# Patient Record
Sex: Female | Born: 1955 | Race: Black or African American | Hispanic: No | Smoking: Former smoker
Health system: Southern US, Community
[De-identification: ages and names within clinical notes are randomized; demographics above are authoritative.]

## PROBLEM LIST (undated history)

## (undated) DIAGNOSIS — B1920 Unspecified viral hepatitis C without hepatic coma: Secondary | ICD-10-CM

## (undated) DIAGNOSIS — C220 Liver cell carcinoma: Secondary | ICD-10-CM

## (undated) DIAGNOSIS — I10 Essential (primary) hypertension: Secondary | ICD-10-CM

## (undated) HISTORY — PX: HEPATIC ARTERY EMBOLIZATION: SHX1743

## (undated) SURGERY — EGD (ESOPHAGOGASTRODUODENOSCOPY)
Anesthesia: Moderate Sedation

---

## 1999-08-06 ENCOUNTER — Encounter: Payer: Self-pay | Admitting: Emergency Medicine

## 1999-08-06 ENCOUNTER — Emergency Department (HOSPITAL_COMMUNITY): Admission: EM | Admit: 1999-08-06 | Discharge: 1999-08-06 | Payer: Self-pay | Admitting: Emergency Medicine

## 2000-07-01 ENCOUNTER — Emergency Department (HOSPITAL_COMMUNITY): Admission: EM | Admit: 2000-07-01 | Discharge: 2000-07-01 | Payer: Self-pay | Admitting: Emergency Medicine

## 2003-09-25 ENCOUNTER — Emergency Department (HOSPITAL_COMMUNITY): Admission: EM | Admit: 2003-09-25 | Discharge: 2003-09-25 | Payer: Self-pay | Admitting: Emergency Medicine

## 2003-10-01 ENCOUNTER — Ambulatory Visit: Payer: Self-pay | Admitting: Family Medicine

## 2003-10-05 ENCOUNTER — Ambulatory Visit: Payer: Self-pay | Admitting: Family Medicine

## 2003-10-28 ENCOUNTER — Ambulatory Visit: Payer: Self-pay | Admitting: Family Medicine

## 2003-11-03 ENCOUNTER — Ambulatory Visit: Payer: Self-pay | Admitting: Family Medicine

## 2003-11-23 ENCOUNTER — Ambulatory Visit: Payer: Self-pay | Admitting: Family Medicine

## 2003-12-09 ENCOUNTER — Ambulatory Visit: Payer: Self-pay | Admitting: Family Medicine

## 2004-01-01 ENCOUNTER — Ambulatory Visit: Payer: Self-pay | Admitting: Family Medicine

## 2004-06-08 ENCOUNTER — Emergency Department (HOSPITAL_COMMUNITY): Admission: EM | Admit: 2004-06-08 | Discharge: 2004-06-08 | Payer: Self-pay | Admitting: Emergency Medicine

## 2004-09-15 ENCOUNTER — Emergency Department (HOSPITAL_COMMUNITY): Admission: EM | Admit: 2004-09-15 | Discharge: 2004-09-15 | Payer: Self-pay | Admitting: Emergency Medicine

## 2005-02-24 ENCOUNTER — Emergency Department (HOSPITAL_COMMUNITY): Admission: EM | Admit: 2005-02-24 | Discharge: 2005-02-24 | Payer: Self-pay | Admitting: Emergency Medicine

## 2006-07-02 ENCOUNTER — Emergency Department (HOSPITAL_COMMUNITY): Admission: EM | Admit: 2006-07-02 | Discharge: 2006-07-02 | Payer: Self-pay | Admitting: Emergency Medicine

## 2008-09-29 ENCOUNTER — Emergency Department (HOSPITAL_COMMUNITY): Admission: EM | Admit: 2008-09-29 | Discharge: 2008-09-29 | Payer: Self-pay | Admitting: Emergency Medicine

## 2008-10-06 ENCOUNTER — Emergency Department (HOSPITAL_COMMUNITY): Admission: EM | Admit: 2008-10-06 | Discharge: 2008-10-06 | Payer: Self-pay | Admitting: Emergency Medicine

## 2008-11-07 ENCOUNTER — Emergency Department (HOSPITAL_COMMUNITY): Admission: EM | Admit: 2008-11-07 | Discharge: 2008-11-08 | Payer: Self-pay | Admitting: Emergency Medicine

## 2009-05-13 ENCOUNTER — Emergency Department (HOSPITAL_COMMUNITY): Admission: EM | Admit: 2009-05-13 | Discharge: 2009-05-13 | Payer: Self-pay | Admitting: Emergency Medicine

## 2010-02-24 ENCOUNTER — Emergency Department (HOSPITAL_COMMUNITY)
Admission: EM | Admit: 2010-02-24 | Discharge: 2010-02-24 | Disposition: A | Discharge status: Home / Self Care | Payer: Self-pay | Attending: Emergency Medicine | Admitting: Emergency Medicine

## 2010-02-24 DIAGNOSIS — R059 Cough, unspecified: Secondary | ICD-10-CM | POA: Insufficient documentation

## 2010-02-24 DIAGNOSIS — R05 Cough: Secondary | ICD-10-CM | POA: Insufficient documentation

## 2010-02-24 DIAGNOSIS — I10 Essential (primary) hypertension: Secondary | ICD-10-CM | POA: Insufficient documentation

## 2010-02-24 DIAGNOSIS — J029 Acute pharyngitis, unspecified: Secondary | ICD-10-CM | POA: Insufficient documentation

## 2010-02-24 DIAGNOSIS — E119 Type 2 diabetes mellitus without complications: Secondary | ICD-10-CM | POA: Insufficient documentation

## 2010-02-24 DIAGNOSIS — Z79899 Other long term (current) drug therapy: Secondary | ICD-10-CM | POA: Insufficient documentation

## 2010-02-24 LAB — RAPID STREP SCREEN (MED CTR MEBANE ONLY): Streptococcus, Group A Screen (Direct): NEGATIVE

## 2010-04-28 LAB — CBC
HCT: 44.7 % (ref 36.0–46.0)
MCHC: 34.7 g/dL (ref 30.0–36.0)
MCV: 92.8 fL (ref 78.0–100.0)
Platelets: 133 10*3/uL — ABNORMAL LOW (ref 150–400)
WBC: 5.1 10*3/uL (ref 4.0–10.5)

## 2010-04-28 LAB — URINALYSIS, ROUTINE W REFLEX MICROSCOPIC
Ketones, ur: NEGATIVE mg/dL
Leukocytes, UA: NEGATIVE
Nitrite: NEGATIVE
Protein, ur: NEGATIVE mg/dL
Urobilinogen, UA: 0.2 mg/dL (ref 0.0–1.0)

## 2010-04-28 LAB — POCT I-STAT, CHEM 8
Creatinine, Ser: 0.8 mg/dL (ref 0.4–1.2)
Glucose, Bld: 497 mg/dL — ABNORMAL HIGH (ref 70–99)
Hemoglobin: 15.6 g/dL — ABNORMAL HIGH (ref 12.0–15.0)
Sodium: 136 mEq/L (ref 135–145)
TCO2: 29 mmol/L (ref 0–100)

## 2010-04-28 LAB — POCT I-STAT 3, VENOUS BLOOD GAS (G3P V)
Acid-Base Excess: 7 mmol/L — ABNORMAL HIGH (ref 0.0–2.0)
O2 Saturation: 23 %
pCO2, Ven: 53.4 mmHg — ABNORMAL HIGH (ref 45.0–50.0)

## 2010-04-28 LAB — BASIC METABOLIC PANEL
BUN: 18 mg/dL (ref 6–23)
CO2: 28 mEq/L (ref 19–32)
Chloride: 93 mEq/L — ABNORMAL LOW (ref 96–112)
Glucose, Bld: 584 mg/dL (ref 70–99)
Potassium: 3.7 mEq/L (ref 3.5–5.1)

## 2010-04-28 LAB — GLUCOSE, CAPILLARY
Glucose-Capillary: 291 mg/dL — ABNORMAL HIGH (ref 70–99)
Glucose-Capillary: 367 mg/dL — ABNORMAL HIGH (ref 70–99)
Glucose-Capillary: 455 mg/dL — ABNORMAL HIGH (ref 70–99)
Glucose-Capillary: 560 mg/dL (ref 70–99)

## 2010-04-28 LAB — DIFFERENTIAL
Basophils Relative: 0 % (ref 0–1)
Eosinophils Absolute: 0 10*3/uL (ref 0.0–0.7)
Eosinophils Relative: 1 % (ref 0–5)
Lymphs Abs: 2.4 10*3/uL (ref 0.7–4.0)

## 2010-04-28 LAB — URINE MICROSCOPIC-ADD ON

## 2010-04-28 LAB — KETONES, QUALITATIVE: Acetone, Bld: NEGATIVE

## 2010-04-29 LAB — POCT I-STAT, CHEM 8
Chloride: 104 mEq/L (ref 96–112)
HCT: 47 % — ABNORMAL HIGH (ref 36.0–46.0)
Potassium: 3.5 mEq/L (ref 3.5–5.1)

## 2010-12-28 ENCOUNTER — Emergency Department (HOSPITAL_COMMUNITY)
Admission: EM | Admit: 2010-12-28 | Discharge: 2010-12-28 | Disposition: A | Discharge status: Home / Self Care | Payer: Self-pay | Attending: Emergency Medicine | Admitting: Emergency Medicine

## 2010-12-28 DIAGNOSIS — I10 Essential (primary) hypertension: Secondary | ICD-10-CM | POA: Insufficient documentation

## 2010-12-28 DIAGNOSIS — L2989 Other pruritus: Secondary | ICD-10-CM | POA: Insufficient documentation

## 2010-12-28 DIAGNOSIS — E119 Type 2 diabetes mellitus without complications: Secondary | ICD-10-CM | POA: Insufficient documentation

## 2010-12-28 DIAGNOSIS — Z79899 Other long term (current) drug therapy: Secondary | ICD-10-CM | POA: Insufficient documentation

## 2010-12-28 DIAGNOSIS — R21 Rash and other nonspecific skin eruption: Secondary | ICD-10-CM | POA: Insufficient documentation

## 2010-12-28 DIAGNOSIS — L298 Other pruritus: Secondary | ICD-10-CM | POA: Insufficient documentation

## 2010-12-28 HISTORY — DX: Essential (primary) hypertension: I10

## 2010-12-28 LAB — GLUCOSE, CAPILLARY: Glucose-Capillary: 152 mg/dL — ABNORMAL HIGH (ref 70–99)

## 2010-12-28 MED ORDER — HYDROXYZINE HCL 25 MG PO TABS: 25.0000 mg | ORAL_TABLET | Freq: Once | ORAL | Status: DC

## 2010-12-28 MED ORDER — TRIAMCINOLONE ACETONIDE 0.1 % EX CREA: TOPICAL_CREAM | Freq: Two times a day (BID) | CUTANEOUS | Status: DC

## 2010-12-28 NOTE — ED Notes (Signed)
PT reports itching to BIL. Palms and bottom of feet. PT also reports to be a DM on oral agents.

## 2010-12-28 NOTE — ED Notes (Signed)
Pt presents with 3 week h/o itching mainly to palms and bottoms of feet.  Pt reports rash to same areas.  Pt denies any new soaps, lotions.

## 2010-12-28 NOTE — ED Provider Notes (Signed)
History     CSN: [id number] Arrival date & time: 12/28/2010  1:39 PM   First MD Initiated Contact with Patient 12/28/10 1637      Chief Complaint  Patient presents with  . Pruritis    (Consider location/radiation/quality/duration/timing/severity/associated sxs/prior treatment) HPI Comments: Patient states she's been having itching of her palm and bottoms of her feet for the last 3 weeks.  She denies any new changes in her soaps or lotions.  Over time plaque started forming on her hands and feet.  She denies a known history of eczema or psoriasis.  She did not have a primary care doctor to followup with. Pt is a diabetic, denies a history of STDs, & IV drug use.   The history is provided by the patient.    Past Medical History  Diagnosis Date  . Diabetes mellitus   . Hypertension     History reviewed. No pertinent past surgical history.  No family history on file.  History  Substance Use Topics  . Smoking status: Current Everyday Smoker -- 0.5 packs/day  . Smokeless tobacco: Not on file  . Alcohol Use: Yes    OB History    Grav Para Term Preterm Abortions TAB SAB Ect Mult Living                  Review of Systems  Constitutional: Negative for fever and chills.  HENT: Negative for neck stiffness.   Gastrointestinal: Negative for vomiting and abdominal pain.  Genitourinary: Negative for dysuria, urgency, vaginal discharge, genital sores, vaginal pain, pelvic pain and dyspareunia.  Musculoskeletal: Negative for joint swelling.  Skin: Positive for rash.  All other systems reviewed and are negative.    Allergies  Review of patient's allergies indicates no known allergies.  Home Medications   Current Outpatient Rx  Name Route Sig Dispense Refill  . CLONIDINE HCL 0.2 MG PO TABS Oral Take 0.2 mg by mouth 2 (two) times daily.      Marland Kitchen GLIPIZIDE 5 MG PO TABS Oral Take 5 mg by mouth 2 (two) times daily before a meal.      . LISINOPRIL-HYDROCHLOROTHIAZIDE 20-25 MG PO  TABS Oral Take 1 tablet by mouth daily.      Marland Kitchen METFORMIN HCL 1000 MG PO TABS Oral Take 1,000 mg by mouth 2 (two) times daily with a meal.        BP 199/119  Pulse 73  Temp(Src) 98.7 F (37.1 C) (Oral)  Resp 14  SpO2 98%  Physical Exam  Nursing note and vitals reviewed. Constitutional: She is oriented to person, place, and time. She appears well-developed and well-nourished. No distress.  HENT:  Head: Normocephalic and atraumatic.  Eyes:       Normal appearance  Neck: Normal range of motion.  Neurological: She is alert and oriented to person, place, and time.  Skin: Skin is warm and dry. Rash noted. No erythema. No pallor.       Pt has gray scale like plaques that are pruritic in nature over her heals and wrist.  Psychiatric: She has a normal mood and affect. Her behavior is normal.    ED Course  Procedures (including critical care time)  Discussed with patient the need to followup with a family care doctor about her hypertension.  Itas also discussed for her followup with a dermatologist in regards to her rash on her palms and heels.  A resource guide will be provided upon discharge.  Labs Reviewed  POCT CBG MONITORING  GLUCOSE, CAPILLARY   No results found.   No diagnosis found.    MDM  Hypertension Rash          Jaci Carrel, [place] 12/28/10 1714

## 2010-12-28 NOTE — ED Provider Notes (Signed)
Medical screening examination/treatment/procedure(s) were performed by non-physician practitioner and as supervising physician I was immediately available for consultation/collaboration.   Joya Gaskins, MD 12/28/10 [phone]

## 2010-12-28 NOTE — ED Notes (Signed)
CBG 152  

## 2011-04-03 ENCOUNTER — Other Ambulatory Visit: Payer: Self-pay

## 2011-04-03 ENCOUNTER — Encounter: Payer: Self-pay | Admitting: Family Medicine

## 2011-04-03 ENCOUNTER — Ambulatory Visit (HOSPITAL_COMMUNITY)
Admission: RE | Admit: 2011-04-03 | Discharge: 2011-04-03 | Disposition: A | Discharge status: Home / Self Care | Payer: Self-pay | Source: Ambulatory Visit | Attending: Family Medicine | Admitting: Family Medicine

## 2011-04-03 ENCOUNTER — Ambulatory Visit (INDEPENDENT_AMBULATORY_CARE_PROVIDER_SITE_OTHER): Payer: Self-pay | Admitting: Family Medicine

## 2011-04-03 DIAGNOSIS — I10 Essential (primary) hypertension: Secondary | ICD-10-CM

## 2011-04-03 DIAGNOSIS — I517 Cardiomegaly: Secondary | ICD-10-CM | POA: Insufficient documentation

## 2011-04-03 DIAGNOSIS — E119 Type 2 diabetes mellitus without complications: Secondary | ICD-10-CM | POA: Insufficient documentation

## 2011-04-03 DIAGNOSIS — I160 Hypertensive urgency: Secondary | ICD-10-CM | POA: Insufficient documentation

## 2011-04-03 LAB — POCT URINALYSIS DIP (MANUAL ENTRY)
Bilirubin, UA: NEGATIVE
Glucose, UA: NEGATIVE
Ketones, POC UA: NEGATIVE
Leukocytes, UA: NEGATIVE
Spec Grav, UA: 1.02
Urobilinogen, UA: 0.2

## 2011-04-03 LAB — CBC WITH DIFFERENTIAL/PLATELET
Basophils Absolute: 0 10*3/uL (ref 0.0–0.1)
Basophils Relative: 0 % (ref 0–1)
MCHC: 32.9 g/dL (ref 30.0–36.0)
Monocytes Absolute: 0.5 10*3/uL (ref 0.1–1.0)
Neutro Abs: 2.3 10*3/uL (ref 1.7–7.7)
Neutrophils Relative %: 39 % — ABNORMAL LOW (ref 43–77)
RDW: 13.9 % (ref 11.5–15.5)

## 2011-04-03 LAB — POCT GLYCOSYLATED HEMOGLOBIN (HGB A1C): Hemoglobin A1C: 7.1

## 2011-04-03 LAB — COMPREHENSIVE METABOLIC PANEL
AST: 48 U/L — ABNORMAL HIGH (ref 0–37)
Albumin: 4 g/dL (ref 3.5–5.2)
Alkaline Phosphatase: 159 U/L — ABNORMAL HIGH (ref 39–117)
BUN: 14 mg/dL (ref 6–23)
Potassium: 3.7 mEq/L (ref 3.5–5.3)

## 2011-04-03 MED ORDER — LISINOPRIL-HYDROCHLOROTHIAZIDE 20-25 MG PO TABS: 1.0000 | ORAL_TABLET | Freq: Every day | ORAL | Status: DC

## 2011-04-03 MED ORDER — CLONIDINE HCL 0.2 MG PO TABS
0.2000 mg | ORAL_TABLET | Freq: Once | ORAL | Status: AC
  Administered 2011-04-03: 0.2 mg via ORAL

## 2011-04-03 NOTE — Assessment & Plan Note (Signed)
No clinical signs of end organ damage on clinical exam or EKG. Labs obtained including CBC, CMET, UA and UDS. Pt given clonidine 0.2 in clinic. Pt instructed to resume home lis-HCTZ with follow up tomorrow. Stressed importance of medication compliance given baseline pressures and clonidine use.Cardovascular red flags discussed extensively with pt. Follow up in am.

## 2011-04-03 NOTE — Assessment & Plan Note (Signed)
A1C checked today. Will follow up at subsequent visit.

## 2011-04-03 NOTE — Progress Notes (Signed)
Addended by: [place], Taft Worthing on: 04/03/2011 04:17 PM   Modules accepted: Orders

## 2011-04-03 NOTE — Patient Instructions (Signed)
It was good to meet she today Be sure to get your blood pressure medications refilled Come back to see me tomorrow If he develops any severe chest pain, weakness, dizziness, headache, vision changes go to the emergency room Call if any questions, God Bless, Doree Albee MD

## 2011-04-03 NOTE — Progress Notes (Signed)
S:  Pt presents today as a new pt with hypertensive urgency. Pt states that she has a longstanding history of hypertension that has been very poorly followed. Pt states that for several years, she has gone to multiple peripheral clinics and emergency departments to get medication refills. Pt states that she has been out of her medications for the last 6 months. Medications include lisinopril-HCTZ 20-25 and clonidine 0.2 mg BID. Pt states that she has been taking ibuprofen for headache and one episode of L forearm tingling x1-2 mins, but otherwise asymptomatic. No CP, hemiparesis, confusion, slurred speech or weakness. 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 No history of heart attack or stroke.   Pt will be applying for [facility] once she leaves the office today.       O:  Current Outpatient Prescriptions  Medication Sig Dispense Refill  . cloNIDine (CATAPRES) 0.2 MG tablet Take 0.2 mg by mouth 2 (two) times daily.        Marland Kitchen glipiZIDE (GLUCOTROL) 5 MG tablet Take 5 mg by mouth 2 (two) times daily before a meal.        . lisinopril-hydrochlorothiazide (PRINZIDE,ZESTORETIC) 20-25 MG per tablet Take 1 tablet by mouth daily.  60 tablet  3  . metFORMIN (GLUCOPHAGE) 1000 MG tablet Take 1,000 mg by mouth 2 (two) times daily with a meal.        . triamcinolone cream (KENALOG) 0.1 % Apply topically 2 (two) times daily.  30 g  0   Current Facility-Administered Medications  Medication Dose Route Frequency Provider Last Rate Last Dose  . cloNIDine (CATAPRES) tablet 0.2 mg  0.2 mg Oral Once Floydene Flock, MD   0.2 mg at 04/03/11 1122    Wt Readings from Last 3 Encounters:  04/03/11 150 lb 14.4 oz (68.448 kg)   Temp Readings from Last 3 Encounters:  12/28/10 98.7 F (37.1 C) Oral   BP Readings from Last 3 Encounters:  04/03/11 232/120  12/28/10 199/119   Pulse Readings from Last 3 Encounters:  04/03/11 78  12/28/10 73    Gen: up in  chair, NAD HEENT: NCAT, EOMI, TMs clear bilaterally CV: RRR, no murmurs auscultated PULM: CTAB, no wheezes, rales, rhoncii ABD: S/NT/+ bowel sounds  EXT: 2+ peripheral pulses  EKG: NSR, No ST or T wave abnormalities, marked LVH A/P:

## 2011-04-04 ENCOUNTER — Ambulatory Visit: Payer: Self-pay | Admitting: Family Medicine

## 2011-04-04 LAB — DRUG SCREEN, URINE
Barbiturate Quant, Ur: NEGATIVE
Benzodiazepines.: NEGATIVE
Creatinine,U: 93.96 mg/dL
Methadone: NEGATIVE
Propoxyphene: NEGATIVE

## 2011-04-05 ENCOUNTER — Telehealth: Payer: Self-pay | Admitting: Family Medicine

## 2011-04-05 ENCOUNTER — Encounter: Payer: Self-pay | Admitting: Family Medicine

## 2011-04-05 ENCOUNTER — Ambulatory Visit (INDEPENDENT_AMBULATORY_CARE_PROVIDER_SITE_OTHER): Payer: Self-pay | Admitting: Family Medicine

## 2011-04-05 VITALS — BP 210/120 | HR 64 | Temp 98.2°F | Ht 65.0 in | Wt 148.0 lb

## 2011-04-05 DIAGNOSIS — I160 Hypertensive urgency: Secondary | ICD-10-CM

## 2011-04-05 DIAGNOSIS — I10 Essential (primary) hypertension: Secondary | ICD-10-CM

## 2011-04-05 MED ORDER — LISINOPRIL-HYDROCHLOROTHIAZIDE 20-25 MG PO TABS: 2.0000 | ORAL_TABLET | Freq: Every day | ORAL | Status: DC

## 2011-04-05 MED ORDER — AMLODIPINE BESYLATE 10 MG PO TABS: 10.0000 mg | ORAL_TABLET | Freq: Every day | ORAL | Status: DC

## 2011-04-05 MED ORDER — CLONIDINE HCL 0.2 MG PO TABS: 0.2000 mg | ORAL_TABLET | Freq: Two times a day (BID) | ORAL | Status: DC

## 2011-04-05 NOTE — Assessment & Plan Note (Addendum)
Pressures still elevated. Asymptomatic currently. I suspect this has been a longstanding issue. Will double lisinopril-HCTZ dose as well as start norvasc. Discussed cardiovascular red flags at length. Blood pressure recheck in 2 days. Follow up early next week to recheck. Pt has still not seen [facility] Would like to obtain ECHO as well as stress test once this is set up. Will hold on starting aspirin given significantly elevated BPs.

## 2011-04-05 NOTE — Patient Instructions (Signed)
It was good to see you today  I am doubling your lisinopril-HCTZ dose. Take 2 tabs daily  I am starting you on norvasc.  Come in on 04/07/11 for a nurse BP check Come back to see me next week If you develop any severe chest pain, dizziness, or shortness of breath, go to the emergency room Call if any questions, God Bless,  Doree Albee MD

## 2011-04-05 NOTE — Progress Notes (Signed)
S:   Pt presents today for BP follow up. Pt was seen on 3/11 and was noted to be in hypertensive urgency at the time with pressures 230s/120s. Per pt, she  was not on medications for > 6 months. Medications include clonidine 0.2mg  BID as well as lisinopril-HCTZ 20-12.5. Pt was given clonidine 0.2 mg in clinic with pt being instructed to resume prior medications and to follow up within 24-48 hours.   Today, pt states that she has been compliant with medications since last visit. Pt denies any SOB, HA, CP. Pt reports one episode of chest discomfort associated with drinking soda. Chest discomfort was improved with belching. Pt states that she checked her BP on Monday. Per her recollection, she had BP of 180.   Pt was also pending [facility] set up. Pt has still not seen [facility]. Pt states that she plans to see Jaynee Eagles today.       O:  Current Outpatient Prescriptions  Medication Sig Dispense Refill  . amLODipine (NORVASC) 10 MG tablet Take 1 tablet (10 mg total) by mouth daily.  90 tablet  3  . cloNIDine (CATAPRES) 0.2 MG tablet Take 1 tablet (0.2 mg total) by mouth 2 (two) times daily.  60 tablet  1  . glipiZIDE (GLUCOTROL) 5 MG tablet Take 5 mg by mouth 2 (two) times daily before a meal.        . lisinopril-hydrochlorothiazide (PRINZIDE,ZESTORETIC) 20-25 MG per tablet Take 2 tablets by mouth daily.  60 tablet  3  . metFORMIN (GLUCOPHAGE) 1000 MG tablet Take 1,000 mg by mouth 2 (two) times daily with a meal.        . triamcinolone cream (KENALOG) 0.1 % Apply topically 2 (two) times daily.  30 g  0    Wt Readings from Last 3 Encounters:  04/05/11 148 lb (67.132 kg)  04/03/11 150 lb 14.4 oz (68.448 kg)   Temp Readings from Last 3 Encounters:  04/05/11 98.2 F (36.8 C) Oral  12/28/10 98.7 F (37.1 C) Oral   BP Readings from Last 3 Encounters:  04/05/11 210/120  04/03/11 232/120  12/28/10 199/119   Pulse Readings from Last 3 Encounters:  04/05/11 64  04/03/11 78  12/28/10 73      Gen: up in chair, NAD  HEENT: NCAT, EOMI, TMs clear bilaterally  CV: RRR, no murmurs auscultated  PULM: CTAB, no wheezes, rales, rhoncii  ABD: S/NT/+ bowel sounds  EXT: 2+ peripheral pulses  EKG: NSR, No ST or T wave abnormalities, marked LVH Neuro: no focal neurological deficits.   A/P:

## 2011-04-05 NOTE — Telephone Encounter (Signed)
States that the Norvasc is too expensive [facility] [place]

## 2011-04-07 ENCOUNTER — Ambulatory Visit (INDEPENDENT_AMBULATORY_CARE_PROVIDER_SITE_OTHER): Payer: Self-pay | Admitting: *Deleted

## 2011-04-07 VITALS — BP 148/92 | HR 76

## 2011-04-07 DIAGNOSIS — I10 Essential (primary) hypertension: Secondary | ICD-10-CM

## 2011-04-07 NOTE — Progress Notes (Signed)
In for BP check today. BP checked manually after patient rested for 10 minutes. BP LA 148/96 and RA 148/92  Pulse 76.    She brings in readings at home also yesterday 6:42 PM 125/91 then 6:45 PM 120/80 This AM reading was 166/103 at home.    Patient denies any chest pain , SOB, dizziness. She is taking meds as directed.  Reviewed meds with her.  Has appointment on 03/21 with Dr. Alvester Morin  Will forward readings to MD.

## 2011-04-10 NOTE — Progress Notes (Signed)
Patient notified of message from MD. 

## 2011-04-10 NOTE — Progress Notes (Signed)
Both readings in office look okay. Please asked patient to bring her cuff with her to the next appointment for comparison.

## 2011-04-10 NOTE — Telephone Encounter (Signed)
This message just appeared on my desk top. Message was received 03/13  . Called patient. She states  she was able to borrow money to buy this medication. She has appointment this week with PCP will discuss also at that time.

## 2011-04-13 ENCOUNTER — Ambulatory Visit: Payer: Self-pay | Admitting: Family Medicine

## 2011-04-18 ENCOUNTER — Ambulatory Visit: Payer: Self-pay | Admitting: Family Medicine

## 2011-04-19 ENCOUNTER — Ambulatory Visit (INDEPENDENT_AMBULATORY_CARE_PROVIDER_SITE_OTHER): Payer: Self-pay | Admitting: Family Medicine

## 2011-04-19 ENCOUNTER — Encounter: Payer: Self-pay | Admitting: Family Medicine

## 2011-04-19 VITALS — BP 169/92 | HR 58 | Ht 65.0 in | Wt 149.7 lb

## 2011-04-19 DIAGNOSIS — I10 Essential (primary) hypertension: Secondary | ICD-10-CM

## 2011-04-19 LAB — COMPREHENSIVE METABOLIC PANEL
ALT: 56 U/L — ABNORMAL HIGH (ref 0–35)
AST: 48 U/L — ABNORMAL HIGH (ref 0–37)
CO2: 28 mEq/L (ref 19–32)
Calcium: 9.4 mg/dL (ref 8.4–10.5)
Chloride: 103 mEq/L (ref 96–112)
Potassium: 3.4 mEq/L — ABNORMAL LOW (ref 3.5–5.3)
Sodium: 142 mEq/L (ref 135–145)
Total Protein: 7.7 g/dL (ref 6.0–8.3)

## 2011-04-19 LAB — LDL CHOLESTEROL, DIRECT: Direct LDL: 73 mg/dL

## 2011-04-19 LAB — POCT GLYCOSYLATED HEMOGLOBIN (HGB A1C): Hemoglobin A1C: 6.9

## 2011-04-19 NOTE — Progress Notes (Signed)
S:  Pt is here for HTN follow up. Pt was noted to be in hypertensive urgency on initial visit. Pt was placed on multiple medication regimen for this. Pt is also pending formal [facility] set up. Pt has follow up appt with Jaynee Eagles today.   HTN:  Checking BPS Daily ?:no; intermittently checking at pharmacy  BP Range:140s-160s per pt  Any HA, CP, SOB?:no Any Medication Side Effects:norvasc--> causing dry mouth per pt  Medication Compliance:yes         O:  Current Outpatient Prescriptions  Medication Sig Dispense Refill  . amLODipine (NORVASC) 10 MG tablet Take 1 tablet (10 mg total) by mouth daily.  90 tablet  3  . cloNIDine (CATAPRES) 0.2 MG tablet Take 1 tablet (0.2 mg total) by mouth 2 (two) times daily.  60 tablet  1  . glipiZIDE (GLUCOTROL) 5 MG tablet Take 5 mg by mouth 2 (two) times daily before a meal.        . lisinopril-hydrochlorothiazide (PRINZIDE,ZESTORETIC) 20-25 MG per tablet Take 2 tablets by mouth daily.  60 tablet  3  . metFORMIN (GLUCOPHAGE) 1000 MG tablet Take 1,000 mg by mouth 2 (two) times daily with a meal.        . triamcinolone cream (KENALOG) 0.1 % Apply topically 2 (two) times daily.  30 g  0    Wt Readings from Last 3 Encounters:  04/19/11 149 lb 11.2 oz (67.903 kg)  04/05/11 148 lb (67.132 kg)  04/03/11 150 lb 14.4 oz (68.448 kg)   Temp Readings from Last 3 Encounters:  04/05/11 98.2 F (36.8 C) Oral  12/28/10 98.7 F (37.1 C) Oral   BP Readings from Last 3 Encounters:  04/19/11 169/92  04/07/11 148/92  04/05/11 210/120   Pulse Readings from Last 3 Encounters:  04/19/11 58  04/07/11 76  04/05/11 64   General: alert and cooperative HEENT: PERRLA and extra ocular movement intact Heart: S1, S2 normal, no murmur, rub or gallop, regular rate and rhythm Lungs: clear to auscultation, no wheezes or rales and unlabored breathing Abdomen: abdomen is soft without significant tenderness, masses, organomegaly or guarding Extremities: extremities  normal, atraumatic, no cyanosis or edema Skin:no rashes Neurology: normal without focal findings, mental status, speech normal, alert and oriented x3, PERLA and reflexes normal and symmetric   A/P:

## 2011-04-19 NOTE — Assessment & Plan Note (Addendum)
Hypertensive urgency otherwise clinically resolved. We'll continue with current regimen. Patient has not taken her Norvasc today with noted elevated blood pressure. Will check followup labs today as lisinopril/HCTZ was recently increased. Will also check A1c direct LDL or secondary risk management. Will further expand workup to include echocardiogram stress test one-step rales or wheeze. This was discussed with patient. Cardiovascular red flags were also discussed.

## 2011-04-19 NOTE — Patient Instructions (Signed)

## 2011-04-24 ENCOUNTER — Telehealth: Payer: Self-pay | Admitting: Family Medicine

## 2011-04-24 MED ORDER — POTASSIUM CHLORIDE CRYS ER 20 MEQ PO TBCR: 20.0000 meq | EXTENDED_RELEASE_TABLET | Freq: Every day | ORAL | Status: DC

## 2011-04-24 NOTE — Telephone Encounter (Signed)
Called and followed up with pt about lab results. Told pt about low K and that I would call medication in for this. Pt agreeable. Told pt to follow up in next 1-2 weeks as previously discussed. Pt expressed understanding.

## 2011-05-04 ENCOUNTER — Telehealth: Payer: Self-pay | Admitting: Family Medicine

## 2011-05-04 NOTE — Telephone Encounter (Signed)
Mrs. Danielsen calling to say the new potassium medicine given has been causing nausea with vomitting.  She is concerned about continuing this, but have been taking it dilligently.  Want to have you call her back asap to advise if she should cease taking or continue.  Did not have much of the reaction today, but still concerned.  Have an appt on Monday with you at 9:30.

## 2011-05-08 ENCOUNTER — Ambulatory Visit: Payer: Self-pay | Admitting: Family Medicine

## 2011-05-09 NOTE — Telephone Encounter (Signed)
Called to follow patient about nausea associated with taking potassium. Patient states that she is having nausea since starting this medication however she did note that her son had a stomach bug around the same time. Patient states she stopped taking the potassium the past 3-4 days and nausea has resolved. Discussed with patient at medication was started because she had a low potassium level. Discussed with patient that it would be prudent for her to come in for [place] to recheck labs as well as recheck her blood pressure. Patient states she was agreeable to come in tomorrow on April 17 for a clinical visit.

## 2011-05-10 ENCOUNTER — Encounter: Payer: Self-pay | Admitting: Family Medicine

## 2011-05-10 ENCOUNTER — Ambulatory Visit (INDEPENDENT_AMBULATORY_CARE_PROVIDER_SITE_OTHER): Payer: Self-pay | Admitting: Family Medicine

## 2011-05-10 VITALS — BP 166/109 | HR 80 | Ht 65.0 in | Wt 151.0 lb

## 2011-05-10 DIAGNOSIS — I10 Essential (primary) hypertension: Secondary | ICD-10-CM

## 2011-05-10 DIAGNOSIS — Z23 Encounter for immunization: Secondary | ICD-10-CM

## 2011-05-10 LAB — BASIC METABOLIC PANEL
BUN: 10 mg/dL (ref 6–23)
Calcium: 9.2 mg/dL (ref 8.4–10.5)
Creat: 0.57 mg/dL (ref 0.50–1.10)

## 2011-05-10 MED ORDER — CARVEDILOL 12.5 MG PO TABS: 12.5000 mg | ORAL_TABLET | Freq: Two times a day (BID) | ORAL | Status: DC

## 2011-05-10 NOTE — Progress Notes (Signed)
S:  Patient presents today for hypertension followup. Patient is noted to have initially presented hypertensive urgency in March of 2013. Patient is been titrated to a medication regimen which includes Norvasc, clonidine, lisinopril/HCTZ. Per patient she's been compliant with his medication regimen has been checking her blood pressures intermittently at home her pressures ranging in the 140s at home. Patient denies any headache chest pain shortness of breath. Patient states she did to get take her clonidine as well as Norvasc this morning.  Patient initially I called earlier last week with complaint of nausea associated with taking potassium pills. However, patient states this is now resolved and feels that this may be more so from a stomach virus that she been able to tolerate oral potassium without any issues in the past 2-3 days.      O:  Current Outpatient Prescriptions  Medication Sig Dispense Refill  . amLODipine (NORVASC) 10 MG tablet Take 1 tablet (10 mg total) by mouth daily.  90 tablet  3  . cloNIDine (CATAPRES) 0.2 MG tablet Take 1 tablet (0.2 mg total) by mouth 2 (two) times daily.  60 tablet  1  . glipiZIDE (GLUCOTROL) 5 MG tablet Take 5 mg by mouth 2 (two) times daily before a meal.        . lisinopril-hydrochlorothiazide (PRINZIDE,ZESTORETIC) 20-25 MG per tablet Take 2 tablets by mouth daily.  60 tablet  3  . metFORMIN (GLUCOPHAGE) 1000 MG tablet Take 1,000 mg by mouth 2 (two) times daily with a meal.        . potassium chloride SA (K-DUR,KLOR-CON) 20 MEQ tablet Take 1 tablet (20 mEq total) by mouth daily.  30 tablet  3  . triamcinolone cream (KENALOG) 0.1 % Apply topically 2 (two) times daily.  30 g  0    Wt Readings from Last 3 Encounters:  05/10/11 151 lb (68.493 kg)  04/19/11 149 lb 11.2 oz (67.903 kg)  04/05/11 148 lb (67.132 kg)   Temp Readings from Last 3 Encounters:  04/05/11 98.2 F (36.8 C) Oral  12/28/10 98.7 F (37.1 C) Oral   BP Readings from Last 3  Encounters:  05/10/11 166/109  04/19/11 169/92  04/07/11 148/92   Pulse Readings from Last 3 Encounters:  05/10/11 80  04/19/11 58  04/07/11 76   General: alert and cooperative HEENT: PERRLA and extra ocular movement intact Heart: S1, S2 normal, no murmur, rub or gallop, regular rate and rhythm Lungs: clear to auscultation, no wheezes or rales and unlabored breathing Abdomen: abdomen is soft without significant tenderness, masses, organomegaly or guarding Extremities: extremities normal, atraumatic, no cyanosis or edema Skin:no rashes Neurology: normal without focal findings, mental status, speech normal, alert and oriented x3, PERLA and reflexes normal and symmetric   A/P:

## 2011-05-10 NOTE — Patient Instructions (Signed)

## 2011-05-10 NOTE — Assessment & Plan Note (Addendum)
Elevated today. Stressed importance of compliance with medication. Rechecking creatinine and potassium today in the setting of most recent serum studies with low potassium. Will add on Coreg to patient's medication regimen as patient is goal blood pressure less than 130/80 is not being that home. Will also give a handout on low salt diet. Followup in 1-2 weeks for longitudinal care issues (pap, mammogram, diabetic eye exam).

## 2011-05-16 ENCOUNTER — Telehealth: Payer: Self-pay | Admitting: Family Medicine

## 2011-05-16 NOTE — Telephone Encounter (Addendum)
Returned call to patient.  Patient states she has been having some dizziness today.  Was lying down and dizziness resolved.  Has a BP monitor at home at it was 102/76.  Patient is also diabetic and has not checked her CBG because she is out of strips.  Did eat breakfast this morning.  Took her BP meds at 5:00am this morning and is due to take the 2nd dose at 5:00pm.  Patient unable to come in today for nurse visit to have BP check.  Appt scheduled for tomorrow at 9:00am.  Patient will recheck her BP at 4:00pm today and call us back with the reading and see if she needs to take her afternoon meds.  Instructed patient to make sure she stands slowly to help with dizziness.   Patient has an [employer]uc[employer]k meter, but [facility] uses a different meter that is free and has affordable strips.  Spoke with [facility] pharmacy and patient will need rx for "meter, strips, and lancets."  Will route note to Dr. Alvester Morin.   Gaylene Brooks, RN

## 2011-05-16 NOTE — Telephone Encounter (Signed)
Has been taking carvedilol since last Wed and is now lightheaded and dizzy - not sure what she should do.

## 2011-05-18 ENCOUNTER — Telehealth: Payer: Self-pay | Admitting: Family Medicine

## 2011-05-18 NOTE — Telephone Encounter (Signed)
Returned call to patient.  Checked BP last night and it was 138/98.  This morning BP was 114/75 and patient concerned that BP is "too low."  Informed patient of normal BP ranges and today's reading is within the normal range.  Patient verbalized understanding.  Has been having some dizziness when she stands, but it usually subsides after a few seconds.  Patient has appt tomorrow for nurse visit to have BP checked.  Instructed to bring her home monitor to compare to our results.  Gaylene Brooks, RN

## 2011-05-18 NOTE — Telephone Encounter (Signed)
Pt is concerned about her BP being too low - wants to talk to nurse

## 2011-05-18 NOTE — Telephone Encounter (Signed)
Rx for glucose meter, testing strips, and testing lancet sent for fax to [facility].

## 2011-05-19 ENCOUNTER — Telehealth: Payer: Self-pay | Admitting: Family Medicine

## 2011-05-19 ENCOUNTER — Ambulatory Visit: Payer: Self-pay | Admitting: Family Medicine

## 2011-05-19 ENCOUNTER — Ambulatory Visit (INDEPENDENT_AMBULATORY_CARE_PROVIDER_SITE_OTHER): Payer: Self-pay | Admitting: *Deleted

## 2011-05-19 VITALS — BP 148/90 | HR 64

## 2011-05-19 DIAGNOSIS — I10 Essential (primary) hypertension: Secondary | ICD-10-CM

## 2011-05-19 MED ORDER — GLUCOSE BLOOD VI STRP: ORAL_STRIP | Status: DC

## 2011-05-19 NOTE — Telephone Encounter (Signed)
Patient called to let the nurse know what Meter she uses.  It is the Accucheck [employer], [id number] are the Test Strips.

## 2011-05-19 NOTE — Telephone Encounter (Signed)
Will send message to Dr. Alvester Morin to send in test strips.  Need to know how many times daily she needs to check.

## 2011-05-19 NOTE — Progress Notes (Signed)
Patient in for BP check. BP checked manually using regular adult cuff.  BP LA 138/ 88 and RA 148 /90 pulse 64.  She brought all medications to visit and these are reviewed . She is taking all meds as directed. Has not taken clonidine this AM . Usually takes it early AM but it tend to cause her drowsiness  So she has not taken today yet.    patient needs strips for her glucose monitor but is not sure what the name of the meter is. She will call back with this information.   Appointment scheduled with PCP as noted at last office visit for CPE and pap for 06/01/2011

## 2011-05-22 ENCOUNTER — Telehealth: Payer: Self-pay | Admitting: Family Medicine

## 2011-05-22 MED ORDER — GLUCOSE BLOOD VI STRP: ORAL_STRIP | Status: DC

## 2011-05-22 NOTE — Telephone Encounter (Signed)
Spoke with patient and gave message . She will get the Relion Meter and supplies.

## 2011-05-22 NOTE — Telephone Encounter (Addendum)
Attempted again to send amd always go to print. Called pharmacy and was told that these strips will cost over $100.00. Pharmacist suggested for patient to buy OTC Relion meter and supplies. Can get everything for $30.00. Message left on voicemail of this.

## 2011-05-22 NOTE — Telephone Encounter (Signed)
Addended by: Orvil Feil L on: 05/22/2011 09:20 AM   Modules accepted: Orders

## 2011-05-22 NOTE — Telephone Encounter (Signed)
rx resent ," print" was selected instead  Normal on Friday. Patient notified.

## 2011-05-22 NOTE — Telephone Encounter (Signed)
An Rx for Test Strips were to be sent to [facility] on Friday, but the Rx was not received.

## 2011-06-01 ENCOUNTER — Encounter: Payer: Self-pay | Admitting: Family Medicine

## 2011-06-26 ENCOUNTER — Encounter: Payer: Self-pay | Admitting: Family Medicine

## 2011-07-05 ENCOUNTER — Ambulatory Visit (INDEPENDENT_AMBULATORY_CARE_PROVIDER_SITE_OTHER): Payer: Self-pay | Admitting: Family Medicine

## 2011-07-05 ENCOUNTER — Encounter: Payer: Self-pay | Admitting: Family Medicine

## 2011-07-05 VITALS — BP 184/96 | HR 75 | Ht 65.0 in | Wt 150.0 lb

## 2011-07-05 DIAGNOSIS — I10 Essential (primary) hypertension: Secondary | ICD-10-CM

## 2011-07-05 DIAGNOSIS — E119 Type 2 diabetes mellitus without complications: Secondary | ICD-10-CM

## 2011-07-05 DIAGNOSIS — Z124 Encounter for screening for malignant neoplasm of cervix: Secondary | ICD-10-CM

## 2011-07-05 DIAGNOSIS — Z111 Encounter for screening for respiratory tuberculosis: Secondary | ICD-10-CM

## 2011-07-05 NOTE — Patient Instructions (Addendum)
It was good to see today Be sure to refill your medications and use them as prescribed We will check your blood sugar today You do not need a Pap smear until October of this year Followup in one month Call if any questions

## 2011-07-05 NOTE — Progress Notes (Signed)
Subjective:  Pt presents today for chronic problem follow up  HTN:  Checking BPS Daily ?:intermittently  BP Range:140s usually when taking medications  Any HA, CP, SOB?:no Any Medication Side Effects:no Medication Compliance:intermittent. Has been out of some meds for last week.  Salt intake?:low per pt Exercise?:no Weight Loss?:no Has been out of coreg for last week. Did not take clonidine this am.   Diabetes: Checking CBGs?:yes How Often?:intermittently  Blood Sugar Range:100s-140s Polyuria, polydypsia, polyphagia:no Symptomatic Hypoglycemia:no; though mild hunger with glipizide Medication Compliance?:no ACE if hypertensive/obesity?:yes Statin if LDL >100?: LDL 73          Review of Systems - Negative except as noted above in HPI.   Objective:  Current Outpatient Prescriptions  Medication Sig Dispense Refill  . amLODipine (NORVASC) 10 MG tablet Take 1 tablet (10 mg total) by mouth daily.  90 tablet  3  . carvedilol (COREG) 12.5 MG tablet Take 1 tablet (12.5 mg total) by mouth 2 (two) times daily with a meal.  60 tablet  3  . cloNIDine (CATAPRES) 0.2 MG tablet Take 1 tablet (0.2 mg total) by mouth 2 (two) times daily.  60 tablet  1  . glipiZIDE (GLUCOTROL) 5 MG tablet Take 5 mg by mouth 2 (two) times daily before a meal.        . glucose blood test strip Please dispense glucose test strips for Accu Chek Aviva  to check Blood sugars two to three times daily . Also dispense lancets  100 each  10  . lisinopril-hydrochlorothiazide (PRINZIDE,ZESTORETIC) 20-25 MG per tablet Take 2 tablets by mouth daily.  60 tablet  3  . metFORMIN (GLUCOPHAGE) 1000 MG tablet Take 1,000 mg by mouth 2 (two) times daily with a meal.        . potassium chloride SA (K-DUR,KLOR-CON) 20 MEQ tablet Take 1 tablet (20 mEq total) by mouth daily.  30 tablet  3  . triamcinolone cream (KENALOG) 0.1 % Apply topically 2 (two) times daily.  30 g  0    Wt Readings from Last 3 Encounters:  07/05/11 150 lb  (68.04 kg)  05/10/11 151 lb (68.493 kg)  04/19/11 149 lb 11.2 oz (67.903 kg)   Temp Readings from Last 3 Encounters:  04/05/11 98.2 F (36.8 C) Oral  12/28/10 98.7 F (37.1 C) Oral   BP Readings from Last 3 Encounters:  07/05/11 176/114  05/19/11 148/90  05/10/11 166/109   Pulse Readings from Last 3 Encounters:  07/05/11 75  05/19/11 64  05/10/11 80    General: alert and cooperative HEENT: PERRLA and extra ocular movement intact Heart: S1, S2 normal, no murmur, rub or gallop, regular rate and rhythm Lungs: clear to auscultation, no wheezes or rales and unlabored breathing Abdomen: abdomen is soft without significant tenderness, masses, organomegaly or guarding Extremities: extremities normal, atraumatic, no cyanosis or edema Skin:no rashes Neurology: normal without focal findings and diabetic foot exam WNL    Assessment/Plan:

## 2011-07-05 NOTE — Assessment & Plan Note (Signed)
Checking A1c today. Will likely titrate off glipizide if A1c is improved. Monotherapy with metformin should be adequate.

## 2011-07-05 NOTE — Assessment & Plan Note (Signed)
Markedly elevated today. Asymptomatic. Patient is noted to have significant rebound hypertension not taking clonidine. Patient not taken clonidine today. Discussed compliance with medications.  Instructed patient to come back in for a blood pressure recheck in the next week. Would like to progressively titrate patient off of clonidine to the adjunctive medications including beta blockers and calcium channel blockers. Cardiovascular red flags discussed.

## 2011-07-07 ENCOUNTER — Telehealth: Payer: Self-pay | Admitting: Family Medicine

## 2011-07-07 ENCOUNTER — Ambulatory Visit (INDEPENDENT_AMBULATORY_CARE_PROVIDER_SITE_OTHER): Payer: Self-pay | Admitting: *Deleted

## 2011-07-07 DIAGNOSIS — IMO0001 Reserved for inherently not codable concepts without codable children: Secondary | ICD-10-CM

## 2011-07-07 DIAGNOSIS — Z111 Encounter for screening for respiratory tuberculosis: Secondary | ICD-10-CM

## 2011-07-07 LAB — TB SKIN TEST: TB Skin Test: NEGATIVE

## 2011-07-07 NOTE — Telephone Encounter (Signed)
Patient was instructed by Abundio Miu that she will need to return to office today to have PPD read in order to fax results. Will also need ROI signed.

## 2011-07-07 NOTE — Telephone Encounter (Signed)
Patient is calling because she wants her TB results faxed to her.  She is in court today and doesn't know how long it is going to take.  She is going to call back with the fax #.

## 2011-07-25 ENCOUNTER — Telehealth: Payer: Self-pay | Admitting: Family Medicine

## 2011-07-25 NOTE — Telephone Encounter (Signed)
Called pt.  1.)Explained, that screening colonoscopy is not covered with the [employer]. Advised, that we can offer her hemoccult cards at her next visit. Explained use of hemoccult cards to pt. 2.) re: bone density test. Pt does not have risk factors (high steroid use...) and spoke with preceptor. Recommended Bone density test at age 56. 3.) pt's new PCP is Dr.Williamson and she will make her next appt with him. Fwd. To PCP for review.  Lorenda Hatchet, Renato Battles

## 2011-07-25 NOTE — Telephone Encounter (Signed)
Patient is calling to check status of Referral for Colonoscopy.  She also has question about what a Bone Density test is for and how it is done.

## 2011-07-26 NOTE — Telephone Encounter (Signed)
I appreciated the help of Thekla in talking with the patient about colon cancer screening. I look forward to seeing her at our next visit.

## 2011-10-12 ENCOUNTER — Telehealth: Payer: Self-pay | Admitting: Family Medicine

## 2011-10-12 ENCOUNTER — Other Ambulatory Visit: Payer: Self-pay | Admitting: Family Medicine

## 2011-10-12 DIAGNOSIS — I10 Essential (primary) hypertension: Secondary | ICD-10-CM

## 2011-10-12 NOTE — Telephone Encounter (Signed)
Whitney Patel need refill on her clonidine and her carvedilol sent to [facility].  Will schedule and appt with her for further refills.

## 2011-10-13 ENCOUNTER — Encounter (HOSPITAL_COMMUNITY): Payer: Self-pay | Admitting: Emergency Medicine

## 2011-10-13 DIAGNOSIS — F17200 Nicotine dependence, unspecified, uncomplicated: Secondary | ICD-10-CM | POA: Insufficient documentation

## 2011-10-13 DIAGNOSIS — I10 Essential (primary) hypertension: Secondary | ICD-10-CM | POA: Insufficient documentation

## 2011-10-13 DIAGNOSIS — R0789 Other chest pain: Principal | ICD-10-CM | POA: Insufficient documentation

## 2011-10-13 DIAGNOSIS — E119 Type 2 diabetes mellitus without complications: Secondary | ICD-10-CM | POA: Insufficient documentation

## 2011-10-13 MED ORDER — CARVEDILOL 12.5 MG PO TABS: 12.5000 mg | ORAL_TABLET | Freq: Two times a day (BID) | ORAL | Status: DC

## 2011-10-13 MED ORDER — CLONIDINE HCL 0.2 MG PO TABS: 0.2000 mg | ORAL_TABLET | Freq: Two times a day (BID) | ORAL | Status: DC

## 2011-10-13 NOTE — ED Notes (Signed)
Pt reports about an hour ago, starting to have pain from jaw radiating down to mid chest; reports happens every few months--but has not had checked out by MD; denies SOB, n/v

## 2011-10-13 NOTE — Telephone Encounter (Signed)
The orders have been sent. Thank you.

## 2011-10-14 ENCOUNTER — Emergency Department (HOSPITAL_COMMUNITY): Admit: 2011-10-14 | Discharge: 2011-10-14 | Disposition: A | Discharge status: Home / Self Care | Payer: Self-pay

## 2011-10-14 ENCOUNTER — Observation Stay (HOSPITAL_COMMUNITY)
Admission: EM | Admit: 2011-10-14 | Discharge: 2011-10-14 | Disposition: A | Discharge status: Home / Self Care | Payer: Self-pay | Attending: Family Medicine | Admitting: Family Medicine

## 2011-10-14 DIAGNOSIS — I10 Essential (primary) hypertension: Secondary | ICD-10-CM

## 2011-10-14 DIAGNOSIS — R079 Chest pain, unspecified: Secondary | ICD-10-CM

## 2011-10-14 LAB — CBC
HCT: 41.8 % (ref 36.0–46.0)
MCH: 30.2 pg (ref 26.0–34.0)
MCHC: 34.2 g/dL (ref 30.0–36.0)
MCV: 87.7 fL (ref 78.0–100.0)
Platelets: 157 10*3/uL (ref 150–400)
RBC: 4.39 MIL/uL (ref 3.87–5.11)
RDW: 13.4 % (ref 11.5–15.5)
RDW: 13.5 % (ref 11.5–15.5)
WBC: 5.2 10*3/uL (ref 4.0–10.5)

## 2011-10-14 LAB — BASIC METABOLIC PANEL
BUN: 13 mg/dL (ref 6–23)
Calcium: 9.7 mg/dL (ref 8.4–10.5)
GFR calc Af Amer: >90 mL/min (ref >90)
GFR calc non Af Amer: >90 mL/min (ref >90)
Glucose, Bld: 174 mg/dL — ABNORMAL HIGH (ref 70–99)
Potassium: 3.3 mEq/L — ABNORMAL LOW (ref 3.5–5.1)
Sodium: 138 mEq/L (ref 135–145)

## 2011-10-14 LAB — TROPONIN I: Troponin I: <0.3 ng/mL (ref <0.30)

## 2011-10-14 LAB — TSH: TSH: 2.72 u[IU]/mL (ref 0.350–4.500)

## 2011-10-14 LAB — CREATININE, SERUM
GFR calc Af Amer: >90 mL/min (ref >90)
GFR calc non Af Amer: >90 mL/min (ref >90)

## 2011-10-14 LAB — HEMOGLOBIN A1C: Mean Plasma Glucose: 143 mg/dL — ABNORMAL HIGH (ref <117)

## 2011-10-14 LAB — LIPID PANEL
HDL: 43 mg/dL (ref >39)
Total CHOL/HDL Ratio: 3 RATIO

## 2011-10-14 LAB — GLUCOSE, CAPILLARY: Glucose-Capillary: 237 mg/dL — ABNORMAL HIGH (ref 70–99)

## 2011-10-14 MED ORDER — LISINOPRIL-HYDROCHLOROTHIAZIDE 20-25 MG PO TABS: 2.0000 | ORAL_TABLET | Freq: Every day | ORAL | Status: DC

## 2011-10-14 MED ORDER — ALUM & MAG HYDROXIDE-SIMETH 200-200-20 MG/5ML PO SUSP: 30.0000 mL | Freq: Four times a day (QID) | ORAL | Status: DC | PRN

## 2011-10-14 MED ORDER — ACETAMINOPHEN 650 MG RE SUPP: 650.0000 mg | Freq: Four times a day (QID) | RECTAL | Status: DC | PRN

## 2011-10-14 MED ORDER — MORPHINE SULFATE 2 MG/ML IJ SOLN: 2.0000 mg | INTRAMUSCULAR | Status: DC | PRN

## 2011-10-14 MED ORDER — POTASSIUM CHLORIDE CRYS ER 20 MEQ PO TBCR
40.0000 meq | EXTENDED_RELEASE_TABLET | Freq: Once | ORAL | Status: AC
  Administered 2011-10-14: 40 meq via ORAL
  Filled 2011-10-14: qty 2

## 2011-10-14 MED ORDER — HEPARIN SODIUM (PORCINE) 5000 UNIT/ML IJ SOLN
5000.0000 [IU] | Freq: Three times a day (TID) | INTRAMUSCULAR | Status: DC
  Administered 2011-10-14: 5000 [IU] via SUBCUTANEOUS
  Filled 2011-10-14 (×3): qty 1

## 2011-10-14 MED ORDER — ONDANSETRON HCL 8 MG PO TABS
4.0000 mg | ORAL_TABLET | Freq: Four times a day (QID) | ORAL | Status: DC | PRN
  Filled 2011-10-14: qty 0.5

## 2011-10-14 MED ORDER — SODIUM CHLORIDE 0.9 % IJ SOLN
3.0000 mL | Freq: Two times a day (BID) | INTRAMUSCULAR | Status: DC
  Administered 2011-10-14: 3 mL via INTRAVENOUS

## 2011-10-14 MED ORDER — AMLODIPINE BESYLATE 10 MG PO TABS
10.0000 mg | ORAL_TABLET | Freq: Every day | ORAL | Status: DC
  Administered 2011-10-14: 10 mg via ORAL
  Filled 2011-10-14: qty 1

## 2011-10-14 MED ORDER — INSULIN ASPART 100 UNIT/ML ~~LOC~~ SOLN
0.0000 [IU] | Freq: Three times a day (TID) | SUBCUTANEOUS | Status: DC
  Administered 2011-10-14: 1 [IU] via SUBCUTANEOUS
  Administered 2011-10-14: 3 [IU] via SUBCUTANEOUS

## 2011-10-14 MED ORDER — GLIPIZIDE 5 MG PO TABS: 5.0000 mg | ORAL_TABLET | Freq: Two times a day (BID) | ORAL | Status: DC

## 2011-10-14 MED ORDER — CARVEDILOL 12.5 MG PO TABS
12.5000 mg | ORAL_TABLET | Freq: Two times a day (BID) | ORAL | Status: DC
  Administered 2011-10-14: 12.5 mg via ORAL
  Filled 2011-10-14 (×3): qty 1

## 2011-10-14 MED ORDER — GLIPIZIDE 5 MG PO TABS
5.0000 mg | ORAL_TABLET | Freq: Two times a day (BID) | ORAL | Status: DC
  Administered 2011-10-14: 5 mg via ORAL
  Filled 2011-10-14 (×3): qty 1

## 2011-10-14 MED ORDER — HYDROCHLOROTHIAZIDE 50 MG PO TABS
50.0000 mg | ORAL_TABLET | Freq: Every day | ORAL | Status: DC
  Administered 2011-10-14: 50 mg via ORAL
  Filled 2011-10-14: qty 1

## 2011-10-14 MED ORDER — METFORMIN HCL 1000 MG PO TABS: 1000.0000 mg | ORAL_TABLET | Freq: Two times a day (BID) | ORAL | Status: DC

## 2011-10-14 MED ORDER — ASPIRIN EC 81 MG PO TBEC
81.0000 mg | DELAYED_RELEASE_TABLET | Freq: Every day | ORAL | Status: DC
  Administered 2011-10-14: 81 mg via ORAL
  Filled 2011-10-14: qty 1

## 2011-10-14 MED ORDER — CLONIDINE HCL 0.2 MG PO TABS
0.2000 mg | ORAL_TABLET | Freq: Two times a day (BID) | ORAL | Status: DC
  Administered 2011-10-14: 0.2 mg via ORAL
  Filled 2011-10-14 (×2): qty 1

## 2011-10-14 MED ORDER — ACETAMINOPHEN 325 MG PO TABS: 650.0000 mg | ORAL_TABLET | Freq: Four times a day (QID) | ORAL | Status: DC | PRN

## 2011-10-14 MED ORDER — ONDANSETRON HCL 4 MG/2ML IJ SOLN: 4.0000 mg | Freq: Four times a day (QID) | INTRAMUSCULAR | Status: DC | PRN

## 2011-10-14 MED ORDER — ASPIRIN 81 MG PO CHEW
324.0000 mg | CHEWABLE_TABLET | Freq: Once | ORAL | Status: AC
  Administered 2011-10-14: 324 mg via ORAL
  Filled 2011-10-14: qty 4

## 2011-10-14 MED ORDER — LISINOPRIL 40 MG PO TABS
40.0000 mg | ORAL_TABLET | Freq: Every day | ORAL | Status: DC
  Administered 2011-10-14: 40 mg via ORAL
  Filled 2011-10-14: qty 1

## 2011-10-14 NOTE — H&P (Signed)
[facility] Admission History and Physical Service Pager: [phone]  Patient name: Whitney Patel Medical record number: [id number] Date of birth: 1955/10/09 Age: 56 y.o. Gender: female  Primary Care Provider: Mat Carne, MD  Chief Complaint: chest pain  Assessment and Plan: Whitney Patel is a 56 y.o. year old female with a history of HTN and DM presenting with chest pain starting at 10:30 pm on 10/13/11.  1   Chest pain: patient with substernal chest pain that resolved on its own with T wave abnormalities on EKG.  Differential includes: ACS vs. PE vs. Esophageal spasm vs. MSK cause. Not likely PE given wells score of 0.  Possibly ACS given patient with multiple risk factors, HTN, DM, family history of MI. Possibly esophageal spasm given description of feeling as though something stuck in throat after swallowing. Also have to consider costochondritis, though patient not currently in pain, so hard to know if reproducible.  -will admit to telemetry  -will cycle cardiac enzymes  -will obtain TSH and lipid panel, most recent A1c 7.4 in June, will repeat this  -Aspirin  -nitro SL prn, morphine prn  -repeat EKG in the morning  2.   DM: patient with last A1c of 7.4. Currently managed on metformin and glipizide at home.  Will start SSI while in hospital and continue home glipizide.  CBG monitoring AC and QHS.  3.   HTN: 178/105 in the ED. Will restart home medications: Norvasc, coreg, clonidine, lisinopril/HCTZ.  4.  Tobacco abuse: patient smokes 1 pack per week.  Will offer smoking cessation counseling while in hospital.  5.  History of illicit drug abuse: will obtain UDS given past use of cocaine.  6.   FEN/GI: heart healthy diet 7.   Prophylaxis: heparin SQ 8.   Disposition: pending negative cardiac workup 9.   Code Status: full  History of Present Illness: Whitney Patel is a 56 y.o. year old female with a history of HTN and DM presenting with chest  pain starting at 10:30 pm on 10/13/11.  Patient states pain was substernal. Described as like eating steak and swallowing it and feeling like it is stuck.  Patient states she was lying in bed watching TV when the pain started. Pain radiated to her bilateral jaw.  Did not radiate elsewhere.  Lasted for 20 minutes and went away on its own. Endorses sometimes feeling as though her heart races when she doesn't take her medications.  Of note she has been out of several of her medications for the past month, notably her clonidine. She smokes one pack of cigarettes over the span of a week. States drinks some beer, but last drink 3 days ago.  Endorses some remote cocaine use in the 1980's and 1990's, but none recently. Denies nausea and vomiting, diaphoresis, and shortness of breath. Denies headache, vision changes, changes in BM, and swelling.  Patient Active Problem List  Diagnosis  . Hypertensive urgency  . DM (diabetes mellitus)  . HTN, goal below 130/80   Past Medical History: Past Medical History  Diagnosis Date  . Diabetes mellitus   . Hypertension    Past Surgical History: History reviewed. No pertinent past surgical history. Social History: History  Substance Use Topics  . Smoking status: Current Every Day Smoker -- 0.2 packs/day    Types: Cigarettes  . Smokeless tobacco: Not on file   Comment: 1 pack per week  . Alcohol Use: Yes     occasion   For any additional social  history documentation, please refer to relevant sections of EMR.  Family History: History reviewed. No pertinent family history. Allergies: No Known Allergies No current facility-administered medications on file prior to encounter.   Current Outpatient Prescriptions on File Prior to Encounter  Medication Sig Dispense Refill  . amLODipine (NORVASC) 10 MG tablet Take 1 tablet (10 mg total) by mouth daily.  90 tablet  3  . carvedilol (COREG) 12.5 MG tablet Take 1 tablet (12.5 mg total) by mouth 2 (two) times daily  with a meal.  60 tablet  1  . cloNIDine (CATAPRES) 0.2 MG tablet Take 1 tablet (0.2 mg total) by mouth 2 (two) times daily.  60 tablet  1  . glipiZIDE (GLUCOTROL) 5 MG tablet Take 5 mg by mouth 2 (two) times daily before a meal.        . glucose blood test strip Please dispense glucose test strips for Accu Chek Aviva  to check Blood sugars two to three times daily . Also dispense lancets  100 each  10  . lisinopril-hydrochlorothiazide (PRINZIDE,ZESTORETIC) 20-25 MG per tablet Take 2 tablets by mouth daily.  60 tablet  3  . metFORMIN (GLUCOPHAGE) 1000 MG tablet Take 1,000 mg by mouth 2 (two) times daily with a meal.        . potassium chloride SA (K-DUR,KLOR-CON) 20 MEQ tablet Take 1 tablet (20 mEq total) by mouth daily.  30 tablet  3   Review Of Systems: Per HPI with the following additions: none Otherwise 12 point review of systems was performed and was unremarkable.  Physical Exam: BP 161/108  Pulse 61  Temp 98.8 F (37.1 C)  Resp 18  SpO2 98% Exam: General: NAD, resting comfortably in bed HEENT: , AT Cardiovascular: rrr, 2/6 systolic mumur best heard at left upper sternal border, no rubs or gallops Respiratory: CTAB, no wheezes or crackles Abdomen: soft, NT, ND, no masses Extremities: no edema Skin: no lesions visualized Neuro: grossly normal  Labs and Imaging: CBC BMET   Lab 10/14/11 0101  WBC 5.7  HGB 14.3  HCT 41.8  PLT 169    Lab 10/14/11 0101  NA 138  K 3.3*  CL 99  CO2 30  BUN 13  CREATININE 0.58  GLUCOSE 174*  CALCIUM 9.7     Results for orders placed during the hospital encounter of 10/14/11 (from the past 24 hour(s))  CBC     Status: Normal   Collection Time   10/14/11  1:01 AM      Component Value Range   WBC 5.7  4.0 - 10.5 K/uL   RBC 4.73  3.87 - 5.11 MIL/uL   Hemoglobin 14.3  12.0 - 15.0 g/dL   HCT 16.1  09.6 - 04.5 %   MCV 88.4  78.0 - 100.0 fL   MCH 30.2  26.0 - 34.0 pg   MCHC 34.2  30.0 - 36.0 g/dL   RDW 40.9  81.1 - 91.4 %   Platelets  169  150 - 400 K/uL  BASIC METABOLIC PANEL     Status: Abnormal   Collection Time   10/14/11  1:01 AM      Component Value Range   Sodium 138  135 - 145 mEq/L   Potassium 3.3 (*) 3.5 - 5.1 mEq/L   Chloride 99  96 - 112 mEq/L   CO2 30  19 - 32 mEq/L   Glucose, Bld 174 (*) 70 - 99 mg/dL   BUN 13  6 - 23 mg/dL  Creatinine, Ser 0.58  0.50 - 1.10 mg/dL   Calcium 9.7  8.4 - 54.0 mg/dL   GFR calc non Af Amer >90  >90 mL/min   GFR calc Af Amer >90  >90 mL/min  TROPONIN I     Status: Normal   Collection Time   10/14/11  1:01 AM      Component Value Range   Troponin I <0.30  <0.30 ng/mL   CXR: No evidence of acute cardiopulmonary disease.  EKG: LVH, nonspecific T wave changes   Marikay Alar, MD 10/14/2011, 2:51 AM   PGY-3 Addendum  Patient seen and examined by me and agree with exam and plan as outlined in above note.  Pain seems atypical but has multiple risk factors for CAD, thus cardiac rule out warranted.  Her BP does not seem to be well controlled and she frequently runs out of her medications, I'm not sure clonidine is the best medication for her due to its tendency to cause rebound hypertension.  May consider weaning her off this.   Everrett Coombe, DO   FMT[facility] Attending Admit Note  Patient seen and examined by me, discussed with admitting resident team and I agree with their assess/plan. Briefly, 55yoF with DM and HTN who ran out of her antihypertensive meds over 1 month ago and now presents with complaint of sudden onset mid-sternal chest pain that started around 10pm last night, felt like "something stuck in throat".  Resolved before she arrived in ED, so she reports to me this morning.  She has remained pain-free since then and is sleeping comfortably when I arrive in the room. Denies any chest pain or dypsnea, denies abd pain or nausea, says she feels like eating breakfast and has good appetite.   Labs, studies including ECG reviewed, agree with Dr. Ashley Royalty' assessment.    Assess/Plan: Apparently atypical chest pain which has since resolved, has remained chest pain-free since.  Has had negative troponins in serial, and ECG is unchanged from March 2013, with LVH most prominent feature. Plan to discharge after last pending TropI is back.  She reports that she has follow up appt with Dr Clinton Sawyer in [facility] on Friday, Sept 27. Paula Compton, MD

## 2011-10-14 NOTE — Progress Notes (Signed)
D/c instructions provided. Patient refused transport to main lobby via wheelchair. Patient walked off floor to meet family at ER entrance. Whitney Patel

## 2011-10-14 NOTE — Discharge Summary (Signed)
Physician Discharge Summary  Patient ID: Whitney Patel MRN: [id number] DOB: 08/10/1955 Age: 56 y.o.  Admit date: 10/14/2011 Discharge date: 10/14/2011 Admitting Physician: Barbaraann Barthel, MD  PCP: Mat Carne, MD  Consultants:none     Discharge Diagnosis: Atypical Chest pain Active Problems:  * No active hospital problems. [facility] Whitney Patel is a 56 y.o. year old female with a history of HTN and DM presenting with chest pain starting at 10:30 pm on 10/13/11.   1 Chest pain: patient with substernal chest pain that resolved on its own with T wave abnormalities on EKG.  Patient with no pain since presenting to the ED. Ruled out ACS as cause with negative troponins. Risk stratification labs pending, TSH and A1c. Lipids wnl. On aspirin, nitro SL, and morphine prn.   2. DM: patient with last A1c of 7.4. Currently managed on metformin and glipizide at home. Will start SSI while in hospital and continue home glipizide. CBG monitoring AC and QHS.   3. HTN: 178/105 in the ED. Will restart home medications: Norvasc, coreg, clonidine, lisinopril/HCTZ.  Clonidine held at time of discharge given patient unreliably taking medications at home and din't want to put at future risk of rebound HTN.    4. Tobacco abuse: patient smokes 1 pack per week. Will offer smoking cessation counseling while in hospital.   5. History of illicit drug abuse: planned to obtain UDS while in hospital, but never collected.  Problem List 1. Atypical chest pain 2. DM 3. HTN 4. Tobacco abuse 5. History of illicit drug use         Discharge PE   Filed Vitals:   10/14/11 1031  BP: 118/79  Pulse:   Temp:   Resp:    General: NAD, resting comfortably in bed  Cardiovascular: rrr, 2/6 systolic mumur best heard at left upper sternal border, no rubs or gallops  Respiratory: CTAB, no wheezes or crackles  Abdomen: soft, NT, ND, no masses  Extremities: no edema  Skin: no lesions  visualized  Procedures/Imaging:  Dg Chest 2 View  10/14/2011  IMPRESSION: No evidence of acute cardiopulmonary disease.     EKG: NSR, LVH, non-specific T wave changes  Labs  CBC  Lab 10/14/11 0730 10/14/11 0101  WBC 5.2 5.7  HGB 13.1 14.3  HCT 38.5 41.8  PLT 157 169   BMET  Lab 10/14/11 0730 10/14/11 0101  NA -- 138  K -- 3.3*  CL -- 99  CO2 -- 30  BUN -- 13  CREATININE 0.62 0.58  CALCIUM -- 9.7  PROT -- --  BILITOT -- --  ALKPHOS -- --  ALT -- --  AST -- --  GLUCOSE -- 174*   Results for orders placed during the hospital encounter of 10/14/11 (from the past 72 hour(s))  CBC     Status: Normal   Collection Time   10/14/11  1:01 AM      Component Value Range Comment   WBC 5.7  4.0 - 10.5 K/uL    RBC 4.73  3.87 - 5.11 MIL/uL    Hemoglobin 14.3  12.0 - 15.0 g/dL    HCT 40.9  81.1 - 91.4 %    MCV 88.4  78.0 - 100.0 fL    MCH 30.2  26.0 - 34.0 pg    MCHC 34.2  30.0 - 36.0 g/dL    RDW 78.2  95.6 - 21.3 %    Platelets 169  150 - 400 K/uL  BASIC METABOLIC PANEL     Status: Abnormal   Collection Time   10/14/11  1:01 AM      Component Value Range Comment   Sodium 138  135 - 145 mEq/L    Potassium 3.3 (*) 3.5 - 5.1 mEq/L    Chloride 99  96 - 112 mEq/L    CO2 30  19 - 32 mEq/L    Glucose, Bld 174 (*) 70 - 99 mg/dL    BUN 13  6 - 23 mg/dL    Creatinine, Ser 1.61  0.50 - 1.10 mg/dL    Calcium 9.7  8.4 - 09.6 mg/dL    GFR calc non Af Amer >90  >90 mL/min    GFR calc Af Amer >90  >90 mL/min   TROPONIN I     Status: Normal   Collection Time   10/14/11  1:01 AM      Component Value Range Comment   Troponin I <0.30  <0.30 ng/mL   CBC     Status: Normal   Collection Time   10/14/11  7:30 AM      Component Value Range Comment   WBC 5.2  4.0 - 10.5 K/uL    RBC 4.39  3.87 - 5.11 MIL/uL    Hemoglobin 13.1  12.0 - 15.0 g/dL    HCT 04.5  40.9 - 81.1 %    MCV 87.7  78.0 - 100.0 fL    MCH 29.8  26.0 - 34.0 pg    MCHC 34.0  30.0 - 36.0 g/dL    RDW 91.4  78.2 - 95.6 %     Platelets 157  150 - 400 K/uL   CREATININE, SERUM     Status: Normal   Collection Time   10/14/11  7:30 AM      Component Value Range Comment   Creatinine, Ser 0.62  0.50 - 1.10 mg/dL    GFR calc non Af Amer >90  >90 mL/min    GFR calc Af Amer >90  >90 mL/min   TROPONIN I     Status: Normal   Collection Time   10/14/11  7:30 AM      Component Value Range Comment   Troponin I <0.30  <0.30 ng/mL   LIPID PANEL     Status: Normal   Collection Time   10/14/11  7:30 AM      Component Value Range Comment   Cholesterol 128  0 - 200 mg/dL    Triglycerides 213  <086 mg/dL    HDL 43  >57 mg/dL    Total CHOL/HDL Ratio 3.0      VLDL 23  0 - 40 mg/dL    LDL Cholesterol 62  0 - 99 mg/dL   GLUCOSE, CAPILLARY     Status: Abnormal   Collection Time   10/14/11  8:41 AM      Component Value Range Comment   Glucose-Capillary 237 (*) 70 - 99 mg/dL   TROPONIN I     Status: Normal   Collection Time   10/14/11 11:09 AM      Component Value Range Comment   Troponin I <0.30  <0.30 ng/mL   GLUCOSE, CAPILLARY     Status: Abnormal   Collection Time   10/14/11 11:15 AM      Component Value Range Comment   Glucose-Capillary 144 (*) 70 - 99 mg/dL        Patient condition at time of discharge/disposition: stable  Disposition-home   Follow  up issues: 1. HTN management, patient on variety of medications at time of discharge, discontinued clonidine given that patient has issues reliably taking medications.  Consider restarting or discontinuing clonidin permanently.  Discharge follow up:  Follow-up Information    Follow up with Mat Carne, MD. (Please keep your appointment at the end of this week with Dr. Clinton Sawyer)    Contact information:   [address] [place] [address] [phone]          Discharge Instructions: Please refer to Patient Instructions section of EMR for full details.  Patient was counseled important signs and symptoms that should prompt return to medical care,  changes in medications, dietary instructions, activity restrictions, and follow up appointments.  Significant instructions noted below:  Discharge Orders    Future Appointments: Provider: Department: Dept Phone: Center:   10/20/2011 1:30 PM Garnetta Buddy, MD [facility] [phone] [facility]     Future Orders Please Complete By Expires   Discharge patient          Discharge Medications   Medication List     As of 10/14/2011 10:16 PM    CONTINUE taking these medications         amLODipine 10 MG tablet   Commonly known as: NORVASC   Take 1 tablet (10 mg total) by mouth daily.      carvedilol 12.5 MG tablet   Commonly known as: COREG   Take 1 tablet (12.5 mg total) by mouth 2 (two) times daily with a meal.      glipiZIDE 5 MG tablet   Commonly known as: GLUCOTROL   Take 1 tablet (5 mg total) by mouth 2 (two) times daily before a meal.      glucose blood test strip   Please dispense glucose test strips for Accu Chek Aviva  to check Blood sugars two to three times daily .  Also dispense lancets      lisinopril-hydrochlorothiazide 20-25 MG per tablet   Commonly known as: PRINZIDE,ZESTORETIC   Take 2 tablets by mouth daily.      metFORMIN 1000 MG tablet   Commonly known as: GLUCOPHAGE   Take 1 tablet (1,000 mg total) by mouth 2 (two) times daily with a meal.      potassium chloride SA 20 MEQ tablet   Commonly known as: K-DUR,KLOR-CON   Take 1 tablet (20 mEq total) by mouth daily.      STOP taking these medications         cloNIDine 0.2 MG tablet   Commonly known as: CATAPRES          Where to get your medications    These are the prescriptions that you need to pick up. We sent them to a specific pharmacy, so you will need to go there to get them.   [id number] - [place] ([place]), [place] - [address]    [address] [place] [address]    Phone: [phone]        glipiZIDE 5 MG tablet   metFORMIN 1000 MG tablet            Marikay Alar, MD of [facility] 10/14/2011 10:16 PM

## 2011-10-14 NOTE — ED Notes (Signed)
Pt. Took home medicine: clonidine 0.2 mg, lisinopril 20-25 mg, carvedilol 12.5mg .

## 2011-10-14 NOTE — ED Provider Notes (Signed)
History     CSN: [id number]  Arrival date & time 10/13/11  2339   First MD Initiated Contact with Patient 10/14/11 0141      Chief Complaint  Patient presents with  . Chest Pain    (Consider location/radiation/quality/duration/timing/severity/associated sxs/prior treatment) HPI 56 year old female presents to emergency room complaining of substernal chest pain. She reports pain started started about an hour prior to arrival while she was lying in bed getting ready to watch TV. Patient denies shortness of breath, no diaphoresis, no nausea. She reports she has had chest pain in the past, has been told that she has cardiovascular disease based on x-ray. Patient with history of diabetes, hypertension, and family history of coronary disease. Patient does not take aspirin daily. Patient reports she has this pain intermittently, but has not had a workup for it. She denies any previous heart catheterization. Patient is a smoker.  Past Medical History  Diagnosis Date  . Diabetes mellitus   . Hypertension     History reviewed. No pertinent past surgical history.  History reviewed. No pertinent family history.  History  Substance Use Topics  . Smoking status: Current Every Day Smoker -- 0.2 packs/day    Types: Cigarettes  . Smokeless tobacco: Not on file   Comment: 1 pack per week  . Alcohol Use: Yes     occasion    OB History    Grav Para Term Preterm Abortions TAB SAB Ect Mult Living                  Review of Systems  All other systems reviewed and are negative.    Allergies  Review of patient's allergies indicates no known allergies.  Home Medications  No current outpatient prescriptions on file.  BP 127/83  Pulse 61  Temp 97.9 F (36.6 C) (Oral)  Resp 18  Ht 5\' 5"  (1.651 m)  Wt 148 lb 5.9 oz (67.3 kg)  BMI 24.69 kg/m2  SpO2 97%  Physical Exam  Nursing note and vitals reviewed. Constitutional: She is oriented to person, place, and time. She appears  well-developed and well-nourished.  HENT:  Head: Normocephalic and atraumatic.  Left Ear: External ear normal.  Nose: Nose normal.  Mouth/Throat: Oropharynx is clear and moist.  Eyes: Conjunctivae normal and EOM are normal. Pupils are equal, round, and reactive to light.  Neck: Normal range of motion. Neck supple. No JVD present. No tracheal deviation present. No thyromegaly present.  Cardiovascular: Normal rate, regular rhythm, normal heart sounds and intact distal pulses.  Exam reveals no gallop and no friction rub.   No murmur heard. Pulmonary/Chest: Effort normal and breath sounds normal. No stridor. No respiratory distress. She has no wheezes. She has no rales. She exhibits no tenderness.  Abdominal: Soft. Bowel sounds are normal. She exhibits no distension and no mass. There is no tenderness. There is no rebound and no guarding.  Musculoskeletal: Normal range of motion. She exhibits no edema and no tenderness.  Lymphadenopathy:    She has no cervical adenopathy.  Neurological: She is alert and oriented to person, place, and time. She has normal reflexes. No cranial nerve deficit. She exhibits normal muscle tone. Coordination normal.  Skin: Skin is warm and dry. No rash noted. No erythema. No pallor.  Psychiatric: She has a normal mood and affect. Her behavior is normal. Judgment and thought content normal.    ED Course  Procedures (including critical care time)  Labs Reviewed  BASIC METABOLIC PANEL - Abnormal; Notable  for the following:    Potassium 3.3 (*)     Glucose, Bld 174 (*)     All other components within normal limits  CBC  TROPONIN I  CBC  CREATININE, SERUM  TSH  TROPONIN I  TROPONIN I  TROPONIN I  HEMOGLOBIN A1C  LIPID PANEL  URINE RAPID DRUG SCREEN (HOSP PERFORMED)   Dg Chest 2 View  10/14/2011  *RADIOLOGY REPORT*  Clinical Data: Chest pain  CHEST - 2 VIEW  Comparison: 11/07/2008  Findings: Mild left basilar atelectasis.  Lungs are otherwise essentially  clear.  No focal consolidation.  No pleural effusion or pneumothorax.  Cardiomediastinal silhouette is within normal limits.  Thoracolumbar scoliosis.  IMPRESSION: No evidence of acute cardiopulmonary disease.   Original Report Authenticated By: Charline Bills, M.D.     Date: 10/14/2011  Rate: 62  Rhythm: normal sinus rhythm  QRS Axis: normal  Intervals: normal  ST/T Wave abnormalities: nonspecific ST/T changes  Conduction Disutrbances:none  Narrative Interpretation: new t wave flattening  Old EKG Reviewed: changes noted    1. Chest pain   2. HTN (hypertension)       MDM  56 year old female with multiple risk factors for coronary disease, no prior history of evaluation for same. Discuss with family practice resident for admission to their service for risk stratification        Olivia Mackie, MD 10/14/11 [phone]

## 2011-10-15 NOTE — Discharge Summary (Signed)
FMTS Attending Note Patient seen and examined by me on the day of discharge, please see separate note for details.  Paula Compton, MD

## 2011-10-20 ENCOUNTER — Ambulatory Visit (INDEPENDENT_AMBULATORY_CARE_PROVIDER_SITE_OTHER): Payer: Self-pay | Admitting: Family Medicine

## 2011-10-20 ENCOUNTER — Encounter: Payer: Self-pay | Admitting: Family Medicine

## 2011-10-20 VITALS — BP 220/110 | HR 66 | Ht 65.0 in | Wt 150.0 lb

## 2011-10-20 DIAGNOSIS — I10 Essential (primary) hypertension: Secondary | ICD-10-CM

## 2011-10-20 DIAGNOSIS — Z720 Tobacco use: Secondary | ICD-10-CM | POA: Insufficient documentation

## 2011-10-20 DIAGNOSIS — I160 Hypertensive urgency: Secondary | ICD-10-CM

## 2011-10-20 DIAGNOSIS — F17200 Nicotine dependence, unspecified, uncomplicated: Secondary | ICD-10-CM

## 2011-10-20 MED ORDER — AMLODIPINE BESYLATE 10 MG PO TABS: 10.0000 mg | ORAL_TABLET | Freq: Every day | ORAL | Status: DC

## 2011-10-20 NOTE — Patient Instructions (Addendum)
Dear Mrs. Smeltzer,   Thank you for coming to clinic today. Please read below regarding the issues that we discussed.   1. Blood Pressure - Please purchase the amlodipine from [facility] or [facility]. It is only about $15 at those locations. Please try to exercise daily and limit your salt into to less than 2 grams per day. This will also help. We may need to increase this amount in the next few weeks.   2. Colonoscopy and Mammogram - These are screening tests that we need to get in the near future. Please call the number on your sheet about setting up the colonoscopy.   Please follow up in clinic in 2 weeks. Please call earlier if you have any questions or concerns.   Sincerely,   Dr. Clinton Sawyer

## 2011-10-21 ENCOUNTER — Encounter: Payer: Self-pay | Admitting: Family Medicine

## 2011-10-21 NOTE — Progress Notes (Signed)
  Subjective:    Patient ID: Whitney Patel, female    DOB: [birth year], 56 y.o.   MRN: [id number]  HPI  56 year old female with DM type 2 and HTN who presents for a follow up visit to a recent hospitalization for chest pain on 10/14/11. At the time of the hospitalization, the patient has chest pain and non specific EKG changes, but her troponin was negative. She was also found to be very hypertensive and discharged on amlodipine, carvedilol, and lisinopril/HCTZ. Today the patient presents stating that she has not filled the prescription for amlodipine secondary to cost. She has been taking her other medications as prescribed. She has been checking her blood pressure at home and found it to be consistently 190/100. She denies any chest pain, shortness of breath, vision changes, unilateral weakness, or numbness or tingling in the extremities.    Review of Systems Negative unless stated in HPI     Objective:   Physical Exam BP 220/110  Pulse 66  Ht 5\' 5"  (1.651 m)  Wt 150 lb (68.04 kg)  BMI 24.96 kg/m2 Gen: middle aged AAF, non ill appearing, pleasant and conversant HEENT: PERRLA, Fundoscopic exam normal without papilledema, no carotid briut  CV: RRR, no murmurs Pulm: CTA-B     Assessment & Plan:  56 year old F with a history severe HTN who presents with uncontrolled HTN, without evidence of end organ damage or dysfunction.

## 2011-10-21 NOTE — Assessment & Plan Note (Signed)
See goals for HTN.

## 2011-10-21 NOTE — Assessment & Plan Note (Signed)
Whitney Patel is currently experiencing asymptomatic hypertensive urgency, which she seems to have repeatedly. Since she is well appearing, without evidence of hypertensive emergency, then her BP can be lowered slowly. She was given another Rx for Amlodipine and told to fill it at [facility] or [facility], where it is the least expensive. She will start taking it right now. She will follow up in 2 weeks. If her BP is still evlevated, consider increased carvedilol if her Hr can tolerate it or starting hydralazine.

## 2011-10-21 NOTE — Assessment & Plan Note (Addendum)
Patient counseled on the benefit of smoking cessation. Her Framingham 10 year risk for CVD is > 36% with continued smoking and uncontrolled hypertension. It is approximately 7% if her HTN is controlled and she quits smoking. I offered her information about the QUIT LINE, but she refused. I will readdress at the next visit.

## 2011-11-08 ENCOUNTER — Ambulatory Visit (INDEPENDENT_AMBULATORY_CARE_PROVIDER_SITE_OTHER): Payer: Self-pay | Admitting: Family Medicine

## 2011-11-08 ENCOUNTER — Encounter: Payer: Self-pay | Admitting: Family Medicine

## 2011-11-08 VITALS — BP 145/87 | HR 69 | Ht 65.0 in | Wt 146.0 lb

## 2011-11-08 DIAGNOSIS — I10 Essential (primary) hypertension: Secondary | ICD-10-CM

## 2011-11-08 DIAGNOSIS — Z720 Tobacco use: Secondary | ICD-10-CM

## 2011-11-08 DIAGNOSIS — F17200 Nicotine dependence, unspecified, uncomplicated: Secondary | ICD-10-CM

## 2011-11-08 DIAGNOSIS — E119 Type 2 diabetes mellitus without complications: Secondary | ICD-10-CM

## 2011-11-08 MED ORDER — HYDROCHLOROTHIAZIDE 12.5 MG PO TABS: 25.0000 mg | ORAL_TABLET | Freq: Every day | ORAL | Status: DC

## 2011-11-08 MED ORDER — GLIPIZIDE 5 MG PO TABS: 5.0000 mg | ORAL_TABLET | Freq: Two times a day (BID) | ORAL | Status: DC

## 2011-11-08 MED ORDER — LISINOPRIL 40 MG PO TABS: 40.0000 mg | ORAL_TABLET | Freq: Every day | ORAL | Status: DC

## 2011-11-08 NOTE — Assessment & Plan Note (Signed)
Increase lisinopril to 40 mg daily. Keep amlodipine, coreg and HCTZ at same doses. Recheck BP and BMET in 1 month.

## 2011-11-08 NOTE — Patient Instructions (Signed)
Dear Mrs. Kucharski,   Thank you for coming to clinic today. Please read below regarding the issues that we discussed.   1. Blood Pressure - Start taking a higher dose at 40 mg daily. Also continue taking the other meds.   2. Diabetes - Continue with metformin and glipizide.   Please follow up in clinic in 1 month. Please call earlier if you have any questions or concerns.   Sincerely,   Dr. Clinton Sawyer

## 2011-11-08 NOTE — Assessment & Plan Note (Signed)
Counseled on smoking cessation. States that she will quit tomorrow. She is concerned about the link between her blood pressure and smoking.

## 2011-11-08 NOTE — Assessment & Plan Note (Signed)
Continue glipizide 5 mg daily and metformin 1000 BID. Recheck A1C in December.

## 2011-11-08 NOTE — Progress Notes (Signed)
  Subjective:    Patient ID: Whitney Patel, female    DOB: May 28, 1955, 56 y.o.   MRN: [id number]  HPI  56 year old F    Hypertension - It was elevated to 220/110 at last visit, so she was restarted on her Amlodipine 10 mg daily. She also continued Lisinopril-HCTZ 20-25 and coreg 12.5 daily. She notes that the pressure is still elevated to 170's/110's at home.   Home BP monitoring:  BP Readings from Last 3 Encounters:  11/08/11 145/87  10/20/11 220/110  10/14/11 118/79   Right Arm - 154/84 Left Arm - 144/82   Hypertension ROS: taking medications as instructed, no medication side effects noted, no TIA's, no chest pain on exertion, no dyspnea on exertion and no swelling of ankles   Review of Systems Positive for vomiting this morning which has resolved. She believes that it is related to hear night. Negative for fever, nausea, vomiting, and diarrhea     Objective:   Physical Exam BP 145/87  Pulse 69  Ht 5\' 5"  (1.651 m)  Wt 146 lb (66.225 kg)  BMI 24.30 kg/m2 Gen: well appearing middle aged AAF, NAD, pleasant and conversant CV: RRR, no murmurs, no carotid bruits Extremities: no edema     Assessment & Plan:  56 year old F with poorly controlled essential HTN.

## 2012-01-26 ENCOUNTER — Ambulatory Visit: Payer: Self-pay | Admitting: Family Medicine

## 2012-03-05 ENCOUNTER — Telehealth: Payer: Self-pay | Admitting: Family Medicine

## 2012-03-05 DIAGNOSIS — E119 Type 2 diabetes mellitus without complications: Secondary | ICD-10-CM

## 2012-03-05 NOTE — Telephone Encounter (Signed)
Ms. Tredway need a referral to the Dental clinic for a root canal and dentures.  Please contact her if need further info.  She is an [place] card recipient.

## 2012-03-05 NOTE — Telephone Encounter (Signed)
Referral placed.

## 2012-03-05 NOTE — Telephone Encounter (Signed)
Will fwd. To PCP for referral.  New orange card to be scanned in by Tia. Lorenda Hatchet, Renato Battles

## 2012-03-13 ENCOUNTER — Telehealth: Payer: Self-pay | Admitting: Family Medicine

## 2012-03-13 ENCOUNTER — Other Ambulatory Visit: Payer: Self-pay | Admitting: Family Medicine

## 2012-03-13 ENCOUNTER — Other Ambulatory Visit (HOSPITAL_COMMUNITY): Payer: Self-pay | Admitting: Family Medicine

## 2012-03-13 DIAGNOSIS — I10 Essential (primary) hypertension: Secondary | ICD-10-CM

## 2012-03-13 MED ORDER — CARVEDILOL 12.5 MG PO TABS: 12.5000 mg | ORAL_TABLET | Freq: Two times a day (BID) | ORAL | Status: DC

## 2012-03-13 NOTE — Telephone Encounter (Signed)
The pharmacy sent the refill request for Carvedilol to the wrong doctor and she is now out and needs this refilled right away.  [facility] on [address].

## 2012-03-13 NOTE — Telephone Encounter (Signed)
Left message on machine informing patient that I sent in 1 month supply to her pharmacy and that she needs to schedule an office visit for further refills.Whitney Patel, [name]

## 2012-04-08 ENCOUNTER — Telehealth: Payer: Self-pay | Admitting: Family Medicine

## 2012-04-08 NOTE — Telephone Encounter (Signed)
Patient is calling to get Dr. Clinton Sawyer opinion about having 18 teeth pulled.  Like how will this be for her heart.

## 2012-04-09 NOTE — Telephone Encounter (Signed)
Please tell the patient that I am not sure given that I don't know the nature of the procedure. She should schedule a visit and bring all of her information from the dentist, so we can discuss this in greater detail. Thank you.

## 2012-04-09 NOTE — Telephone Encounter (Signed)
Called patient and told her she needs to schedule an office visit to discuss the dental procedure. She will call back to schedule that appointment.Loranzo Desha, [name]

## 2012-04-11 ENCOUNTER — Ambulatory Visit (INDEPENDENT_AMBULATORY_CARE_PROVIDER_SITE_OTHER): Payer: No Typology Code available for payment source | Admitting: Family Medicine

## 2012-04-11 ENCOUNTER — Encounter: Payer: Self-pay | Admitting: Family Medicine

## 2012-04-11 VITALS — BP 140/66 | HR 72 | Ht 65.0 in | Wt 154.0 lb

## 2012-04-11 DIAGNOSIS — I10 Essential (primary) hypertension: Secondary | ICD-10-CM

## 2012-04-11 DIAGNOSIS — F17200 Nicotine dependence, unspecified, uncomplicated: Secondary | ICD-10-CM

## 2012-04-11 DIAGNOSIS — E119 Type 2 diabetes mellitus without complications: Secondary | ICD-10-CM

## 2012-04-11 DIAGNOSIS — Z720 Tobacco use: Secondary | ICD-10-CM

## 2012-04-11 DIAGNOSIS — Z01818 Encounter for other preprocedural examination: Secondary | ICD-10-CM

## 2012-04-11 MED ORDER — CARVEDILOL 12.5 MG PO TABS: 12.5000 mg | ORAL_TABLET | Freq: Two times a day (BID) | ORAL | Status: DC

## 2012-04-11 NOTE — Assessment & Plan Note (Signed)
Discussed smoking cessation and benefits for blood pressure control, lung function, risk of cancer, effects on cardiovascular system, etc. Pt states she has tried to quit in the past but does not identify specific obstacles. Counseled on smoking strategies and provided with 1-800-QUIT-NOW information.

## 2012-04-11 NOTE — Progress Notes (Signed)
  Subjective:    Patient ID: Whitney Patel, female    DOB: 1955-08-10, 57 y.o.   MRN: [id number]  HPI: Pt presents to clinic for evaluation before a dental procedure and for blood pressure follow-up.  BP: Pt reports compliance with medications. She states her blood pressure today is "good for her" (140/66). No headaches/change in vision, dizziness, or chest pain. Needs a refill on carvedilol and believes she may need refills on other meds, but needs to check.  Dental concerns/pre-op eval: Pt has been referred to dental clinic through orange card program. She often has bad breath and has several loose teeth. She was seen 3/17 and has an appointment on 4/3 to see Dr. Manson Passey ([address], [phone]); she states they plan to pull 18 teeth to fit her for dentures, and she wants to make sure she is healthy enough and to make sure that "her heart is okay" to get the procedure done. She has no chest pain, SOB, or decreased exercise tolerance (pt does not get winded or have pain with exertion, states she can climb 2 flights of stairs easily, etc). No PND, no lower extremity edema. No current tooth pain or concern for active dental infection; no gum bleeding/drainage or broken teeth.  SH: Current every-day smoker; states "one pack might last me a week." Interested in quitting; has tried to quit in the past but does not identify specific obstacles/difficulties other than "it's hard."  Review of Systems: As above.      Objective:   Physical Exam BP 140/66  Pulse 72  Ht 5\' 5"  (1.651 m)  Wt 154 lb (69.854 kg)  BMI 25.63 kg/m2 Gen: adult female in NAD, pleasant/cooperative HEENT: [place]/AT, EOMI, posterior oropharynx clear, conjunctivae clear, TMs clear bilaterally, MMM  Generally poor dentition with several missing teeth and some redness of gingivae at gum line  Few loose teeth, particularly most posterior left lower molar and lower incisors  No particular tooth or gum tenderness, no obvious  abscess or swelling of gums Cardio: RRR, no murmur appreciated, normal S1/S2 Pulm: CTAB, no wheezes, good air movement bilaterally Abd: soft, nondistended, BS+ Ext: no cyanosis, clubbing, edema; distal pulses 2+ and intact/symmetric in all four extremities     Assessment & Plan:

## 2012-04-11 NOTE — Assessment & Plan Note (Signed)
A: Pt concerned for general health prior to low-risk dental procedure. No current concerning cardiovascular symptoms. Blood pressure reasonably controlled. Otherwise appears medically optimized.   P: No concern for CAD (other than risk factors of HTN, DM, and smoking, which are being addressed), so will defer EKG at this time. Continue current medical management of HTN and DM with routine follow-up. Pt to follow up with dentistry providers to discuss specifics/planning/timing of dental procedures. See also other problem list notes. Also discussed with Dr. Mauricio Po.

## 2012-04-11 NOTE — Patient Instructions (Addendum)
Thank you for coming in, today! It was nice to meet you. I believe you are well enough to have your dental procedure done.    Your blood pressure is under reasonable control. I sent in a refill for your carvedilol for 3 months' worth.    You do not have any symptoms that make me concerned about your heart. Improving your diet and exercise habits and stopping smoking will help your overall state of health, in general. The free phone number to call for help stopping smoking is 1-800-QUIT-NOW (248-042-1999). Keep your appointment with the dentist, Dr. Manson Passey, on April 3. Make an appointment to come back to see Dr. Clinton Sawyer to talk more about your blood pressure and diabetes, or if you need more refills. You can schedule an appointment with him sometime in the next few weeks. It does not need to be a specific day. Please call at any time with any questions or other concerns. --Dr. Casper Harrison

## 2012-04-11 NOTE — Assessment & Plan Note (Signed)
A1c overdue. Pt to schedule f/u appointment with PCP for medication management/refills.

## 2012-04-11 NOTE — Assessment & Plan Note (Signed)
A: Elevated today but close to goal. Pt reports compliance but requests refill on Coreg; unsure of other medications but may need refills on them.  P: Refilled Coreg (3 month supply, no further refills). Continue amlodipine, HCTZ, and lisinopril, as well. Pt to f/u with PCP for further management/refills.

## 2012-05-30 ENCOUNTER — Telehealth: Payer: Self-pay | Admitting: Family Medicine

## 2012-05-30 DIAGNOSIS — E119 Type 2 diabetes mellitus without complications: Secondary | ICD-10-CM

## 2012-05-30 MED ORDER — HYDROCHLOROTHIAZIDE 12.5 MG PO TABS: 25.0000 mg | ORAL_TABLET | Freq: Every day | ORAL | Status: DC

## 2012-05-30 NOTE — Telephone Encounter (Signed)
Called pt and informed Rx sent to pharmacy. Lorenda Hatchet, Renato Battles

## 2012-05-30 NOTE — Telephone Encounter (Signed)
Patient is calling because her pharmacy says she is out of refills on her HCTZ sent to [facility] on [address].

## 2012-07-18 ENCOUNTER — Other Ambulatory Visit: Payer: Self-pay | Admitting: Family Medicine

## 2012-07-23 ENCOUNTER — Other Ambulatory Visit: Payer: Self-pay | Admitting: *Deleted

## 2012-07-23 DIAGNOSIS — I10 Essential (primary) hypertension: Secondary | ICD-10-CM

## 2012-07-23 MED ORDER — AMLODIPINE BESYLATE 10 MG PO TABS: 10.0000 mg | ORAL_TABLET | Freq: Every day | ORAL | Status: DC

## 2012-08-23 ENCOUNTER — Telehealth: Payer: Self-pay | Admitting: Family Medicine

## 2012-08-23 NOTE — Telephone Encounter (Signed)
Returned call to pt  - would like to know if vit c and d would be ok to take - confirmed that that would be fine - Pt verbalized understanding Wyatt Haste, RN-BSN

## 2012-08-23 NOTE — Telephone Encounter (Signed)
The patient is calling with a question about supplementing vitamins with her meds and what is ok.

## 2012-09-05 ENCOUNTER — Ambulatory Visit: Payer: No Typology Code available for payment source | Admitting: Family Medicine

## 2012-09-06 NOTE — Progress Notes (Signed)
Patient ID: Whitney Patel, female   DOB: 29-Aug-1955, 57 y.o.   MRN: [id number]  Erroneous encounter. Patient did not present for a visit.

## 2012-09-06 NOTE — Addendum Note (Signed)
Addended by: Garnetta Buddy on: 09/06/2012 12:17 PM   Modules accepted: Level of Service, SmartSet

## 2012-09-06 NOTE — Progress Notes (Signed)
This encounter was created in error - please disregard.

## 2012-11-08 ENCOUNTER — Other Ambulatory Visit: Payer: Self-pay | Admitting: Family Medicine

## 2012-11-08 DIAGNOSIS — I10 Essential (primary) hypertension: Secondary | ICD-10-CM

## 2012-11-08 MED ORDER — AMLODIPINE BESYLATE 10 MG PO TABS: 10.0000 mg | ORAL_TABLET | Freq: Every day | ORAL | Status: DC

## 2012-12-06 ENCOUNTER — Other Ambulatory Visit: Payer: Self-pay | Admitting: Family Medicine

## 2012-12-06 DIAGNOSIS — E119 Type 2 diabetes mellitus without complications: Secondary | ICD-10-CM

## 2012-12-06 MED ORDER — LISINOPRIL 40 MG PO TABS: 40.0000 mg | ORAL_TABLET | Freq: Every day | ORAL | Status: DC

## 2012-12-09 ENCOUNTER — Other Ambulatory Visit: Payer: Self-pay | Admitting: Family Medicine

## 2012-12-09 MED ORDER — LISINOPRIL 20 MG PO TABS: 20.0000 mg | ORAL_TABLET | Freq: Two times a day (BID) | ORAL | Status: DC

## 2013-01-16 ENCOUNTER — Encounter (HOSPITAL_COMMUNITY): Payer: Self-pay | Admitting: Emergency Medicine

## 2013-01-16 ENCOUNTER — Emergency Department (HOSPITAL_COMMUNITY): Payer: No Typology Code available for payment source

## 2013-01-16 ENCOUNTER — Emergency Department (HOSPITAL_COMMUNITY)
Admission: EM | Admit: 2013-01-16 | Discharge: 2013-01-16 | Disposition: A | Discharge status: Home / Self Care | Payer: No Typology Code available for payment source | Attending: Emergency Medicine | Admitting: Emergency Medicine

## 2013-01-16 DIAGNOSIS — Z79899 Other long term (current) drug therapy: Secondary | ICD-10-CM | POA: Insufficient documentation

## 2013-01-16 DIAGNOSIS — R1110 Vomiting, unspecified: Secondary | ICD-10-CM | POA: Insufficient documentation

## 2013-01-16 DIAGNOSIS — E119 Type 2 diabetes mellitus without complications: Secondary | ICD-10-CM | POA: Insufficient documentation

## 2013-01-16 DIAGNOSIS — F17200 Nicotine dependence, unspecified, uncomplicated: Secondary | ICD-10-CM | POA: Insufficient documentation

## 2013-01-16 DIAGNOSIS — J159 Unspecified bacterial pneumonia: Secondary | ICD-10-CM | POA: Insufficient documentation

## 2013-01-16 DIAGNOSIS — I10 Essential (primary) hypertension: Secondary | ICD-10-CM | POA: Insufficient documentation

## 2013-01-16 DIAGNOSIS — J189 Pneumonia, unspecified organism: Secondary | ICD-10-CM

## 2013-01-16 LAB — BASIC METABOLIC PANEL
Calcium: 9.2 mg/dL (ref 8.4–10.5)
Creatinine, Ser: 0.76 mg/dL (ref 0.50–1.10)
GFR calc Af Amer: >90 mL/min (ref >90)
GFR calc non Af Amer: >90 mL/min (ref >90)
Glucose, Bld: 155 mg/dL — ABNORMAL HIGH (ref 70–99)
Sodium: 139 mEq/L (ref 135–145)

## 2013-01-16 LAB — CBC WITH DIFFERENTIAL/PLATELET
Basophils Relative: 0 % (ref 0–1)
Eosinophils Absolute: 0.1 10*3/uL (ref 0.0–0.7)
HCT: 39.2 % (ref 36.0–46.0)
Hemoglobin: 13.8 g/dL (ref 12.0–15.0)
MCH: 31.9 pg (ref 26.0–34.0)
MCHC: 35.2 g/dL (ref 30.0–36.0)
Monocytes Absolute: 0.7 10*3/uL (ref 0.1–1.0)
Monocytes Relative: 10 % (ref 3–12)
Neutrophils Relative %: 46 % (ref 43–77)

## 2013-01-16 LAB — POCT I-STAT TROPONIN I: Troponin i, poc: 0 ng/mL (ref 0.00–0.08)

## 2013-01-16 MED ORDER — LEVOFLOXACIN 750 MG PO TABS: 750.0000 mg | ORAL_TABLET | Freq: Once | ORAL | Status: DC

## 2013-01-16 MED ORDER — ALBUTEROL SULFATE (5 MG/ML) 0.5% IN NEBU
5.0000 mg | INHALATION_SOLUTION | Freq: Once | RESPIRATORY_TRACT | Status: AC
  Administered 2013-01-16: 5 mg via RESPIRATORY_TRACT
  Filled 2013-01-16: qty 1

## 2013-01-16 MED ORDER — AMOXICILLIN-POT CLAVULANATE 875-125 MG PO TABS
1.0000 | ORAL_TABLET | Freq: Once | ORAL | Status: AC
  Administered 2013-01-16: 1 via ORAL
  Filled 2013-01-16: qty 1

## 2013-01-16 MED ORDER — AMOXICILLIN-POT CLAVULANATE 875-125 MG PO TABS: 1.0000 | ORAL_TABLET | Freq: Two times a day (BID) | ORAL | Status: DC

## 2013-01-16 NOTE — ED Notes (Addendum)
Pt reports productive cough with brown sputum, loss of voice, nausea, vomiting since Saturday.

## 2013-01-16 NOTE — ED Provider Notes (Signed)
CSN: [id number]     Arrival date & time 01/16/13  0701 History   First MD Initiated Contact with Patient 01/16/13 0703     Chief Complaint  Patient presents with  . Laryngitis   (Consider location/radiation/quality/duration/timing/severity/associated sxs/prior Treatment) The history is provided by the patient.  Whitney Patel is a 57 y.o. female history of DM, HTN here with cough. 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 Productive cough with brown sputum for the last 4-5 days. She had some chills but no fevers at home. Also some post tussis emesis as well. Denies any chest pains or shortness of breath. She still smokes everyday but no cardiac history. 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     Past Medical History  Diagnosis Date  . Diabetes mellitus   . Hypertension    History reviewed. No pertinent past surgical history. No family history on file. History  Substance Use Topics  . Smoking status: Current Every Day Smoker -- 0.25 packs/day for 30 years    Types: Cigarettes    Start date: 10/21/1971  . Smokeless tobacco: Not on file     Comment: 1 pack per week  . Alcohol Use: Yes     Comment: occasion   OB History   Grav Para Term Preterm Abortions TAB SAB Ect Mult Living                 Review of Systems  Respiratory: Positive for cough.   All other systems reviewed and are negative.    Allergies  Review of patient's allergies indicates no known allergies.  Home Medications   Current Outpatient Rx  Name  Route  Sig  Dispense  Refill  . amLODipine (NORVASC) 10 MG tablet   Oral   Take 1 tablet (10 mg total) by mouth daily.   90 tablet   3   . carvedilol (COREG) 12.5 MG tablet   Oral   Take 12.5 mg by mouth 2 (two) times daily with a meal.         . glipiZIDE (GLUCOTROL) 5 MG tablet   Oral   Take 1 tablet (5 mg total) by mouth 2 (two) times daily before a meal.   60 tablet   5   .  hydrochlorothiazide (HYDRODIURIL) 12.5 MG tablet   Oral   Take 2 tablets (25 mg total) by mouth daily.   30 tablet   5   . lisinopril (PRINIVIL,ZESTRIL) 20 MG tablet   Oral   Take 1 tablet (20 mg total) by mouth 2 (two) times daily.   60 tablet   1     Patient needs appointment before next refill.   Marland Kitchen glucose blood test strip      Please dispense glucose test strips for Accu Chek Aviva  to check Blood sugars two to three times daily . Also dispense lancets   100 each   10    BP 161/94  Pulse 70  Temp(Src) 98.3 F (36.8 C) (Oral)  Resp 18  SpO2 100% Physical Exam  Nursing note and vitals reviewed. Constitutional: She is oriented to person, place, and time. She appears well-developed and well-nourished.  Comfortable, occasional cough  HENT:  Head: Normocephalic.  Mouth/Throat: Oropharynx is clear and moist.  Eyes: Conjunctivae are normal. Pupils are equal, round, and reactive to light.  Neck: Normal range of motion. Neck supple.  Cardiovascular: Normal rate, regular rhythm and normal  heart sounds.   Pulmonary/Chest: Effort normal and breath sounds normal.  Diminished breath sounds bilaterally, no wheezing or crackles   Abdominal: Soft. Bowel sounds are normal. She exhibits no distension. There is no tenderness. There is no rebound.  Musculoskeletal: Normal range of motion.  Neurological: She is alert and oriented to person, place, and time.  Skin: Skin is warm and dry.  Psychiatric: She has a normal mood and affect. Her behavior is normal. Judgment and thought content normal.    ED Course  Procedures (including critical care time) Labs Review Labs Reviewed  BASIC METABOLIC PANEL - Abnormal; Notable for the following:    Glucose, Bld 155 (*)    All other components within normal limits  CBC WITH DIFFERENTIAL  POCT I-STAT TROPONIN I   Imaging Review Dg Chest 2 View  01/16/2013   CLINICAL DATA:  Cough, congestion  EXAM: CHEST  2 VIEW  COMPARISON:  10/14/2011   FINDINGS: There are new patchy opacities over the right upper lobe and right lower lobe, respectively. Heart size upper limits of normal. S shaped thoracolumbar spine curvature reidentified. Left lung is grossly clear. No pleural effusion.  IMPRESSION: Patchy right upper and lower lobe airspace opacities which could represent pneumonia given the history of cough and congestion. Followup to radiographic resolution is recommended which may take 4-6 weeks to exclude underlying neoplasm.   Electronically Signed   By: Christiana Pellant M.D.   On: 01/16/2013 08:18    EKG Interpretation    Date/Time:  Thursday January 16 2013 07:20:13 EST Ventricular Rate:  65 PR Interval:  171 QRS Duration: 74 QT Interval:  430 QTC Calculation: 447 R Axis:   40 Text Interpretation:  Sinus rhythm Atrial premature complex RSR' in V1 or V2, probably normal variant Consider left ventricular hypertrophy No significant change since last tracing Confirmed by Alva Broxson  MD, Krystel Fletchall [phone]) on 01/16/2013 7:26:23 AM            MDM  No diagnosis found. Whitney Patel is a 57 y.o. female here with cough. Likely viral syndrome vs bronchitis. Unlikely to be ACS. Will get baseline EKG, CXR to r/o pneumonia. Will give neb and reassess.   8:30 AM CXR showed possible pneumonia. WBC nl, not septic. She has no insurance so will d/c home with augmentin. Recommend smoking cessation.   Richardean Canal, MD 01/16/13 [phone]

## 2013-01-31 ENCOUNTER — Ambulatory Visit: Payer: No Typology Code available for payment source

## 2013-02-21 ENCOUNTER — Other Ambulatory Visit: Payer: Self-pay | Admitting: Family Medicine

## 2013-02-21 DIAGNOSIS — E119 Type 2 diabetes mellitus without complications: Secondary | ICD-10-CM

## 2013-02-21 MED ORDER — HYDROCHLOROTHIAZIDE 12.5 MG PO TABS: 25.0000 mg | ORAL_TABLET | Freq: Every day | ORAL | Status: DC

## 2013-05-30 ENCOUNTER — Encounter: Payer: Self-pay | Admitting: Family Medicine

## 2013-05-30 ENCOUNTER — Ambulatory Visit (INDEPENDENT_AMBULATORY_CARE_PROVIDER_SITE_OTHER): Payer: No Typology Code available for payment source | Admitting: Family Medicine

## 2013-05-30 VITALS — BP 196/108 | HR 64 | Temp 98.1°F | Ht 66.0 in | Wt 144.6 lb

## 2013-05-30 DIAGNOSIS — Z720 Tobacco use: Secondary | ICD-10-CM

## 2013-05-30 DIAGNOSIS — F17200 Nicotine dependence, unspecified, uncomplicated: Secondary | ICD-10-CM

## 2013-05-30 DIAGNOSIS — I10 Essential (primary) hypertension: Secondary | ICD-10-CM

## 2013-05-30 DIAGNOSIS — E119 Type 2 diabetes mellitus without complications: Secondary | ICD-10-CM

## 2013-05-30 LAB — CBC
HCT: 37.4 % (ref 36.0–46.0)
Hemoglobin: 13.1 g/dL (ref 12.0–15.0)
MCH: 30.5 pg (ref 26.0–34.0)
MCHC: 35 g/dL (ref 30.0–36.0)
MCV: 87 fL (ref 78.0–100.0)
Platelets: 202 10*3/uL (ref 150–400)
RBC: 4.3 MIL/uL (ref 3.87–5.11)
RDW: 13.9 % (ref 11.5–15.5)
WBC: 6.2 10*3/uL (ref 4.0–10.5)

## 2013-05-30 LAB — POCT GLYCOSYLATED HEMOGLOBIN (HGB A1C): HEMOGLOBIN A1C: 6

## 2013-05-30 MED ORDER — AMLODIPINE BESYLATE 10 MG PO TABS: 10.0000 mg | ORAL_TABLET | Freq: Every day | ORAL | Status: DC

## 2013-05-30 MED ORDER — LISINOPRIL 20 MG PO TABS: 20.0000 mg | ORAL_TABLET | Freq: Two times a day (BID) | ORAL | Status: DC

## 2013-05-30 NOTE — Assessment & Plan Note (Signed)
A: very poorly controlled P: restart amlodipine and lisinopril, continue carvedilol, recheck in 2 weeks

## 2013-05-30 NOTE — Assessment & Plan Note (Signed)
Encouraged cessation and pt motivated to quit.

## 2013-05-30 NOTE — Progress Notes (Signed)
   Subjective:    Patient ID: Whitney Patel, female    DOB: 07-Feb-1955, 58 y.o.   MRN: [id number]  HPI  58 year old F with DM and HTN who has not been seen in clinic for greater than 1 year. She has been out of the following medications:   Diabetes  Recent Issues:  Hypoglycemia: No   Medication Compliance: Diabetes medication: No - pt takes metformin 1000 mg twice a week Taking ACE-I: No Taking statin: No  Behavioral: Home CBG Monitoring: Yes - 120's Diet changes: No - eats lots of bread Exercise: No  Health Maintenance: Visual problems: No Last eye exam: never Last dental visit: last month Foot ulcers: No  Hypertension  Home BP monitoring: no  Office BP: BP Readings from Last 3 Encounters:  05/30/13 196/108  01/16/13 153/91  04/11/12 140/66    Prescribed meds: see below  Hypertension ROS:  Taking medications as prescribed:No Chest pain: No Shortness of breath: No Swelling of extremities: No TIA symptoms: No Regular exercise: No Low Na+ diet: No Alcohol/tobacco/drug use: Yes - tobacco use x 2   Tobacco use pt using several cigarettes per day, would like to quit, does not want nicotine replacement and believes that she can quit on her own  Review of Systems  Pertinent items are noted in HPI.    Objective:   Physical Exam BP 196/108  Pulse 64  Temp(Src) 98.1 F (36.7 C) (Oral)  Ht 5\' 6"  (1.676 m)  Wt 144 lb 9.6 oz (65.59 kg)  BMI 23.35 kg/m2  General: alert, well appearing, and in no distress and thin AAF Eyes: fundoscopic exam without neovascularization but limited because not dilated Cardiovascular: RRR, nl S1 and S2, soft 1/6 systolic murmur Pulmonary: clear to auscultation bilaterally Feet: warm, good capillary refill, normal DP and PT pulses and normal monofilament exam, hallux valgus on MTP joint bilaterally   Labs:   Diabetic Labs:  Lab Results  Component Value Date   HGBA1C 6.0 05/30/2013   HGBA1C 6.6* 10/14/2011   HGBA1C 7.4  07/05/2011   Lab Results  Component Value Date   LDLCALC 62 10/14/2011   CREATININE 0.76 01/16/2013   Last microalbumin: No results found for this basename: MICROALBUR,  [id number]        Assessment & Plan:

## 2013-05-30 NOTE — Assessment & Plan Note (Signed)
A: well controlled with A1C of 6.0 with little to no meds P: - check lipids, renal function, and cbc - restart metformin 1000 mg daily if creat < 1.5 - referral for ophthalmology exam

## 2013-05-30 NOTE — Patient Instructions (Signed)
Dear Ms. Rena,   Thank you for coming to clinic today. Please read below regarding the issues that we discussed.   1. Diabetes - Do not take metformin until I call you after I get the test results from today. Continue to decrease the amount of bread that you eat. Also, I put in a referral for an eye doctor evaluation.   2. Hypertension - Please restart the lisinopril and amlodipine. Continue the carvedilol.   3. Tobacco Use - Stop smoking all together, and this will decrease your risk of stroke and heart attack.   4. Aspirin. I would like for you to take an 81 mg aspirin per day.   Please follow up in clinic in 2 weeks. Please call earlier if you have any questions or concerns.   Sincerely,   Dr. Maricela Bo

## 2013-05-31 LAB — LIPID PANEL
Cholesterol: 124 mg/dL (ref 0–200)
HDL: 48 mg/dL (ref >39)
LDL Cholesterol: 60 mg/dL (ref 0–99)
TRIGLYCERIDES: 82 mg/dL (ref <150)
Total CHOL/HDL Ratio: 2.6 Ratio
VLDL: 16 mg/dL (ref 0–40)

## 2013-05-31 LAB — COMPREHENSIVE METABOLIC PANEL
ALT: 41 U/L — AB (ref 0–35)
AST: 37 U/L (ref 0–37)
Albumin: 4 g/dL (ref 3.5–5.2)
Alkaline Phosphatase: 129 U/L — ABNORMAL HIGH (ref 39–117)
BUN: 12 mg/dL (ref 6–23)
CALCIUM: 8.7 mg/dL (ref 8.4–10.5)
CHLORIDE: 102 meq/L (ref 96–112)
CO2: 27 mEq/L (ref 19–32)
Creat: 0.65 mg/dL (ref 0.50–1.10)
Glucose, Bld: 161 mg/dL — ABNORMAL HIGH (ref 70–99)
POTASSIUM: 4 meq/L (ref 3.5–5.3)
SODIUM: 137 meq/L (ref 135–145)
TOTAL PROTEIN: 7.3 g/dL (ref 6.0–8.3)
Total Bilirubin: 0.5 mg/dL (ref 0.2–1.2)

## 2013-06-04 ENCOUNTER — Other Ambulatory Visit: Payer: Self-pay | Admitting: Family Medicine

## 2013-06-04 ENCOUNTER — Telehealth: Payer: Self-pay | Admitting: Family Medicine

## 2013-06-04 NOTE — Telephone Encounter (Signed)
Pt called to notify that labs are back. She was instructed to start taking metformin 1000 mg BID. She was also told that she need to follow up in clinic to discuss her abnormal liver function tests and starting a cholesterol medications.

## 2013-06-05 NOTE — Telephone Encounter (Signed)
Would like to talk to dr Maricela Bo about her abnormal liver functions Very concerned and scared [phone]

## 2013-06-06 NOTE — Telephone Encounter (Signed)
Pt called and told that she has abnormal liver function tests that we need to follow up on. 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 She will follow up with me on Wednesday 5/20 for for this issue.

## 2013-06-06 NOTE — Telephone Encounter (Signed)
Patient calls again, really would like to speak to MD about her recent labs. Please call today [phone]

## 2013-06-11 ENCOUNTER — Ambulatory Visit (INDEPENDENT_AMBULATORY_CARE_PROVIDER_SITE_OTHER): Payer: No Typology Code available for payment source | Admitting: Family Medicine

## 2013-06-11 VITALS — BP 125/76 | HR 71 | Temp 99.6°F | Ht 65.0 in | Wt 139.0 lb

## 2013-06-11 DIAGNOSIS — R7989 Other specified abnormal findings of blood chemistry: Secondary | ICD-10-CM

## 2013-06-11 DIAGNOSIS — R945 Abnormal results of liver function studies: Secondary | ICD-10-CM

## 2013-06-11 LAB — PROTIME-INR
INR: 1.05 (ref <1.50)
Prothrombin Time: 13.6 seconds (ref 11.6–15.2)

## 2013-06-11 LAB — GAMMA GT: GGT: 151 U/L — AB (ref 7–51)

## 2013-06-11 NOTE — Patient Instructions (Signed)
Ms. Whitney Patel,   Thank you for coming to clinic today. Please read below regarding the issues that we discussed.   1. Liver fuction - we are going to get some tests to check on the liver and see about an possible infection. I will let you know the results. I understand that you are concerned, but we will make sure you are taken care of.   I will call you with the results. Please call earlier if you have any questions or concerns.   Sincerely,   Dr. Maricela Bo

## 2013-06-11 NOTE — Progress Notes (Signed)
   Subjective:    Patient ID: Whitney Patel, female    DOB: 07-Feb-1955, 58 y.o.   MRN: [id number]  HPI  58 year old F with poorly controlled HTN, well controlled DM, hx of IV drug use and persistently elevated LFT's who presents for evaluation.   Elevated LFT's - Pt with history very mildly elevated AST, ALT, and alk phos (< 2 x ULN) dating back to 2013. It appears that it has never been worked up, and the patient previously denied any history of known liver disease. However, when I asked about hepatitis C today, she states, "I was told when I went to a doctor at [facility] that I had hepatitis C." That was approximately 10 years ago, and she was abusing drugs at the time. She states that it has never been followed up. Pt denies tylenol use. She does drink alcohol, but states it is only 40 ounces every two weeks on the day that she gets paid.   PMH - IV drug use, Hep C?   Current Outpatient Prescriptions on File Prior to Visit  Medication Sig Dispense Refill  . amLODipine (NORVASC) 10 MG tablet Take 1 tablet (10 mg total) by mouth daily.  90 tablet  3  . carvedilol (COREG) 12.5 MG tablet Take 12.5 mg by mouth 2 (two) times daily with a meal.      . glipiZIDE (GLUCOTROL) 5 MG tablet Take 1 tablet (5 mg total) by mouth 2 (two) times daily before a meal.  60 tablet  5  . glucose blood test strip Please dispense glucose test strips for Accu Chek Aviva  to check Blood sugars two to three times daily . Also dispense lancets  100 each  10  . hydrochlorothiazide (HYDRODIURIL) 12.5 MG tablet Take 2 tablets (25 mg total) by mouth daily.  30 tablet  1  . lisinopril (PRINIVIL,ZESTRIL) 20 MG tablet Take 1 tablet (20 mg total) by mouth 2 (two) times daily.  60 tablet  1   No current facility-administered medications on file prior to visit.      Review of Systems Negative for nausea, vomiting, abdominal pain, jaundice, confusion     Objective:   Physical Exam BP 125/76  Pulse 71  Temp(Src)  99.6 F (37.6 C) (Oral)  Ht $R'5\' 5"'vr$  (1.651 m)  Wt 139 lb (63.05 kg)  BMI 23.13 kg/m2  Gen: middle-aged [place] American female, distressed due to concern about her liver, alert and oriented Eyes: no scleral icterus Abdomen: mildly distended, firm palpable liver edge 1 cm below the right costal margin, nontender Skin: non-jaundice Psych: anxious and repeatedly asks if she is dying     Assessment & Plan:

## 2013-06-12 DIAGNOSIS — R7989 Other specified abnormal findings of blood chemistry: Secondary | ICD-10-CM | POA: Insufficient documentation

## 2013-06-12 DIAGNOSIS — R945 Abnormal results of liver function studies: Secondary | ICD-10-CM | POA: Insufficient documentation

## 2013-06-12 LAB — HEPATITIS B CORE ANTIBODY, TOTAL: HEP B C TOTAL AB: NONREACTIVE

## 2013-06-12 LAB — HIV ANTIBODY (ROUTINE TESTING W REFLEX): HIV 1&2 Ab, 4th Generation: NONREACTIVE

## 2013-06-12 LAB — HEPATITIS C ANTIBODY: HCV Ab: REACTIVE — AB

## 2013-06-12 LAB — HEPATITIS B SURFACE ANTIGEN: Hepatitis B Surface Ag: NEGATIVE

## 2013-06-12 NOTE — Assessment & Plan Note (Addendum)
A: chronically mildly elevated LFT's (AST, ALT, and Alk phos) and patient who is a very erratic historian now reports a history of hepatitis C P:  - check Hep C and Hep B serology - check HIV given IV drug use hx - check GGT to see if biliary pattern, and consider RUQ u/s - check INR to eval for cirrhosis  - if data inconclusive, consider work up for Wilson's disease or autoimmune hepatitis

## 2013-06-17 ENCOUNTER — Telehealth: Payer: Self-pay | Admitting: Family Medicine

## 2013-06-17 NOTE — Telephone Encounter (Signed)
I tried to call the patient's number through my phone, the hospital phone and operator without success. I will ask our staff to contact her husband and ask her to schedule an appointment.

## 2013-06-17 NOTE — Telephone Encounter (Signed)
Called and spoke with patient's husband and asked him to have his wife call and schedule an appointment with Dr Maricela Bo.Jaymes Graff Kamiya Acord

## 2013-06-24 ENCOUNTER — Encounter: Payer: Self-pay | Admitting: Family Medicine

## 2013-06-24 ENCOUNTER — Ambulatory Visit (INDEPENDENT_AMBULATORY_CARE_PROVIDER_SITE_OTHER): Payer: No Typology Code available for payment source | Admitting: Family Medicine

## 2013-06-24 VITALS — BP 156/97 | HR 75 | Ht 65.0 in | Wt 141.0 lb

## 2013-06-24 DIAGNOSIS — B182 Chronic viral hepatitis C: Secondary | ICD-10-CM | POA: Insufficient documentation

## 2013-06-24 DIAGNOSIS — R945 Abnormal results of liver function studies: Secondary | ICD-10-CM

## 2013-06-24 DIAGNOSIS — R768 Other specified abnormal immunological findings in serum: Secondary | ICD-10-CM

## 2013-06-24 DIAGNOSIS — R894 Abnormal immunological findings in specimens from other organs, systems and tissues: Secondary | ICD-10-CM

## 2013-06-24 DIAGNOSIS — R7989 Other specified abnormal findings of blood chemistry: Secondary | ICD-10-CM

## 2013-06-24 DIAGNOSIS — F1911 Other psychoactive substance abuse, in remission: Secondary | ICD-10-CM

## 2013-06-24 NOTE — Assessment & Plan Note (Signed)
A: given positive Ab test, pt needs RNA quant and genotype performed; also get Hep A Ab to see if pt needs vaccination  P:  - Likely referral to ID clinic after results return

## 2013-06-24 NOTE — Assessment & Plan Note (Signed)
Likely due to chronic hep C, although the GGT elevated 3X ULN is concerning for biliary disease.  I will check a RUQ ultrasound now and delay testing for other causes of elevated LFT's like autoimmune or Wilson's disease

## 2013-06-24 NOTE — Progress Notes (Signed)
   Subjective:    Patient ID: Whitney Patel, female    DOB: 1955/03/21, 58 y.o.   MRN: [id number]  HPI  58 year old F with hx of Hep C who is Ab positive and presents for follow up of recent lab tests. The test demonstrated a positive Hep C antibody, negative Hep B and HIV testing. Moreover, the patient's INR was normal. GGT was increase to 3x ULN. These labs were explained to the patient. She denies any new symptoms of RUQ pain, nausea, vomiting, jaundice, fatigue. She is not taking tyelnol.     Review of Systems See HPI     Objective:   Physical Exam BP 156/97  Pulse 75  Ht 5\' 5"  (1.651 m)  Wt 141 lb (63.957 kg)  BMI 23.46 kg/m2  Gen: middle aged AAF, normal body habitus, alert and oriented x 4 Eyes: no icterus Psych: very anxious appearing, normal speech and thought content       Assessment & Plan:

## 2013-06-24 NOTE — Patient Instructions (Signed)
Whitney Patel,   Nice to see you today. We will get some more labs and ultrasound of your liver to see if we can get some more information. I will call you with the results.   Sincerely,   Dr. Maricela Bo

## 2013-06-25 LAB — HEPATITIS A ANTIBODY, TOTAL: Hep A Total Ab: REACTIVE — AB

## 2013-06-26 LAB — HEPATITIS C RNA QUANTITATIVE
HCV QUANT: 1438072 [IU]/mL — AB (ref <15)
HCV Quantitative Log: 6.16 {Log} — ABNORMAL HIGH (ref <1.18)

## 2013-06-27 LAB — HEPATITIS C GENOTYPE

## 2013-06-29 ENCOUNTER — Telehealth: Payer: Self-pay | Admitting: Family Medicine

## 2013-06-29 DIAGNOSIS — B182 Chronic viral hepatitis C: Secondary | ICD-10-CM

## 2013-06-29 NOTE — Telephone Encounter (Signed)
Pt informed of test results of from Hep C and recommendation for ID clinic. Pt agreeable to this plan.

## 2013-06-30 ENCOUNTER — Telehealth: Payer: Self-pay | Admitting: Family Medicine

## 2013-06-30 ENCOUNTER — Ambulatory Visit
Admission: RE | Admit: 2013-06-30 | Discharge: 2013-06-30 | Disposition: A | Discharge status: Home / Self Care | Payer: No Typology Code available for payment source | Source: Ambulatory Visit | Attending: Family Medicine | Admitting: Family Medicine

## 2013-06-30 DIAGNOSIS — K7460 Unspecified cirrhosis of liver: Secondary | ICD-10-CM | POA: Insufficient documentation

## 2013-06-30 DIAGNOSIS — R945 Abnormal results of liver function studies: Secondary | ICD-10-CM

## 2013-06-30 DIAGNOSIS — K7689 Other specified diseases of liver: Secondary | ICD-10-CM

## 2013-06-30 DIAGNOSIS — R7989 Other specified abnormal findings of blood chemistry: Secondary | ICD-10-CM

## 2013-06-30 DIAGNOSIS — K769 Liver disease, unspecified: Secondary | ICD-10-CM | POA: Insufficient documentation

## 2013-06-30 NOTE — Telephone Encounter (Signed)
Patient returned call and was instructed NPO 4 hrs prior.Whitney Patel

## 2013-06-30 NOTE — Assessment & Plan Note (Signed)
Multiple liver lesions seen on ultrasound on 06/30/13 with the largest one measuring 4.9 cm. There is concern for hepatocellular carcinoma or metastases. Therefore, I will order a MRI w w/o contrast for further evaluation.

## 2013-06-30 NOTE — Telephone Encounter (Signed)
Pt called to discuss results of abdominal ultrasound. Message left to return call to clinic.

## 2013-06-30 NOTE — Telephone Encounter (Signed)
Called and informed patient of appointment for MRI on 07/04/13 at 8:30 pm. It will be at [facility], [place]. Left a message to return call. I forgot to tell Whitney Patel that she cannot have anything to eat or drink 4 hours prior to her MRI.Marland KitchenMarland KitchenJaymes Graff Deziya Amero

## 2013-06-30 NOTE — Telephone Encounter (Signed)
Pt informed of liver masses and need for MRI to further evaluate for [possible cancer. Pt agrees with the plan. She requests that we set up an MRI after 3 PM, because that is when she gets off work. I told her that I will place and order for this imaging and have my nurse call her about the time.

## 2013-07-04 ENCOUNTER — Inpatient Hospital Stay
Admission: RE | Admit: 2013-07-04 | Discharge: 2013-07-04 | Disposition: A | Discharge status: Home / Self Care | Payer: No Typology Code available for payment source | Source: Ambulatory Visit | Attending: Family Medicine | Admitting: Family Medicine

## 2013-07-11 ENCOUNTER — Ambulatory Visit
Admission: RE | Admit: 2013-07-11 | Discharge: 2013-07-11 | Disposition: A | Discharge status: Home / Self Care | Payer: No Typology Code available for payment source | Source: Ambulatory Visit | Attending: Family Medicine | Admitting: Family Medicine

## 2013-07-11 DIAGNOSIS — K7689 Other specified diseases of liver: Secondary | ICD-10-CM

## 2013-07-11 MED ORDER — GADOXETATE DISODIUM 0.25 MMOL/ML IV SOLN
6.0000 mL | Freq: Once | INTRAVENOUS | Status: AC | PRN
  Administered 2013-07-11: 6 mL via INTRAVENOUS

## 2013-07-14 ENCOUNTER — Telehealth: Payer: Self-pay | Admitting: Family Medicine

## 2013-07-14 ENCOUNTER — Telehealth: Payer: Self-pay | Admitting: *Deleted

## 2013-07-14 DIAGNOSIS — C220 Liver cell carcinoma: Secondary | ICD-10-CM

## 2013-07-14 NOTE — Telephone Encounter (Signed)
Pt called and informed of MRI showing hepatic masses consistent with cancer. I suggested that she sees a cancer expert ASAP, which she is agreeable with.

## 2013-07-14 NOTE — Telephone Encounter (Signed)
Patient scheduled to come in 7/2 for Hep C labs.  Dr. Maricela Bo (PCP) wants to know if she should be seen by an oncologist first (MRI results show hepatic lesions consistent with carcinoma).   Please advise. Landis Gandy, RN

## 2013-07-15 ENCOUNTER — Telehealth: Payer: Self-pay | Admitting: Family Medicine

## 2013-07-15 NOTE — Telephone Encounter (Signed)
Message left for patient that a referral was made to the liver specialist at [facility] in [place].

## 2013-07-15 NOTE — Telephone Encounter (Signed)
Referral was placed in oncology workqueue, I also faxed over a paper copy marked urgent and to please schedule appointment for patient asap.Busick, Kevin Fenton

## 2013-07-15 NOTE — Telephone Encounter (Signed)
[place].  Jettie Pagan is answering the phone this morning.  Phone: [phone] Fax: [phone]

## 2013-07-15 NOTE — Telephone Encounter (Signed)
Dr. Maricela Bo,     For this patient, you should send her to the [facility] hepatology clinic in our building, they can handle the [facility] [employer] and hep C treatment if indicated.  It can be hard to get in but maybe try to call the doctor about you patient and talk to him or the PA.  Sharyn Lull can forward you the info if you don't have it.  Rob

## 2013-07-15 NOTE — Telephone Encounter (Addendum)
I called [facility] Liver care and left a message with the fax number at [facility] so that I may make a referral for Mrs. Wempe.

## 2013-07-16 ENCOUNTER — Telehealth: Payer: Self-pay | Admitting: Family Medicine

## 2013-07-16 ENCOUNTER — Telehealth: Payer: Self-pay | Admitting: Oncology

## 2013-07-16 NOTE — Telephone Encounter (Signed)
Pt stopped by and wanted Dr. Maricela Bo to call the [facility] at [phone] to schedule her an appointment since she can never get through to them Please call her if you have any questions. jw

## 2013-07-16 NOTE — Telephone Encounter (Signed)
Left message on patient voicemail that Dr. Maricela Bo has been calling and leaving messages, informed patient we would continue to try and get her set up. FYI to PCP

## 2013-07-16 NOTE — Telephone Encounter (Signed)
LEFT MESSAGE FOR PATIENT TO RETURN CALL TO SCHEDULE NP APPT.  °

## 2013-07-18 ENCOUNTER — Other Ambulatory Visit: Payer: Self-pay | Admitting: Oncology

## 2013-07-18 DIAGNOSIS — K769 Liver disease, unspecified: Secondary | ICD-10-CM

## 2013-07-21 ENCOUNTER — Ambulatory Visit (INDEPENDENT_AMBULATORY_CARE_PROVIDER_SITE_OTHER): Payer: No Typology Code available for payment source | Admitting: Family Medicine

## 2013-07-21 ENCOUNTER — Ambulatory Visit: Payer: No Typology Code available for payment source | Admitting: Family Medicine

## 2013-07-21 VITALS — BP 159/79 | HR 68 | Temp 98.7°F | Wt 138.0 lb

## 2013-07-21 DIAGNOSIS — T148XXA Other injury of unspecified body region, initial encounter: Secondary | ICD-10-CM

## 2013-07-21 LAB — CBC WITH DIFFERENTIAL/PLATELET
BASOS ABS: 0 10*3/uL (ref 0.0–0.1)
Basophils Relative: 0 % (ref 0–1)
EOS PCT: 1 % (ref 0–5)
Eosinophils Absolute: 0.1 10*3/uL (ref 0.0–0.7)
HCT: 35.1 % — ABNORMAL LOW (ref 36.0–46.0)
HEMOGLOBIN: 12.3 g/dL (ref 12.0–15.0)
LYMPHS ABS: 2.2 10*3/uL (ref 0.7–4.0)
LYMPHS PCT: 38 % (ref 12–46)
MCH: 30.4 pg (ref 26.0–34.0)
MCHC: 35 g/dL (ref 30.0–36.0)
MCV: 86.7 fL (ref 78.0–100.0)
MONO ABS: 0.4 10*3/uL (ref 0.1–1.0)
MONOS PCT: 7 % (ref 3–12)
Neutro Abs: 3.1 10*3/uL (ref 1.7–7.7)
Neutrophils Relative %: 54 % (ref 43–77)
Platelets: 201 10*3/uL (ref 150–400)
RBC: 4.05 MIL/uL (ref 3.87–5.11)
RDW: 13.5 % (ref 11.5–15.5)
WBC: 5.8 10*3/uL (ref 4.0–10.5)

## 2013-07-21 NOTE — Progress Notes (Signed)
   Subjective:    Patient ID: Whitney Patel, female    DOB: 05/26/1955, 58 y.o.   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 It is located over the lateral upper arm. Onset spontaneously 2 days ago. It is mildly tender. Patient denies any other similar lesions on the body. She denies any known trauma. She is not on any anticoagulants or antiplatelet medications. On May 30, 2013 the patient had a normal CBC and PT/INR.  Current Outpatient Prescriptions on File Prior to Visit  Medication Sig Dispense Refill  . amLODipine (NORVASC) 10 MG tablet Take 1 tablet (10 mg total) by mouth daily.  90 tablet  3  . carvedilol (COREG) 12.5 MG tablet Take 12.5 mg by mouth 2 (two) times daily with a meal.      . glipiZIDE (GLUCOTROL) 5 MG tablet Take 1 tablet (5 mg total) by mouth 2 (two) times daily before a meal.  60 tablet  5  . glucose blood test strip Please dispense glucose test strips for Accu Chek Aviva  to check Blood sugars two to three times daily . Also dispense lancets  100 each  10  . hydrochlorothiazide (HYDRODIURIL) 12.5 MG tablet Take 2 tablets (25 mg total) by mouth daily.  30 tablet  1  . lisinopril (PRINIVIL,ZESTRIL) 20 MG tablet Take 1 tablet (20 mg total) by mouth 2 (two) times daily.  60 tablet  1   No current facility-administered medications on file prior to visit.     Review of Systems Positive for large bruise Negative for pain, bleeding elsewhere, trauma, weakness of arm, decreased sensation     Objective:   Physical Exam There were no vitals taken for this visit.  Gen: middle age female, non ill appearing, pleasant  Skin: 4 cm x 4 cm circular ecchymosis with palpable hematoma of left upper our forearm with minimal tenderness to palpation MSK: LUE without edema, normal ROM of left shoulder and elbow, 5/5 strength Neuro: sensation  intact      Assessment & Plan:

## 2013-07-21 NOTE — Assessment & Plan Note (Signed)
A: Unclear etiology as pt with no know risk for spontaneous bleeding since protein synthesis capability of liver appeared intact 05/30/13 P: Check CBC w/ diff, PT/INR, continue to monitor

## 2013-07-22 ENCOUNTER — Telehealth: Payer: Self-pay | Admitting: Family Medicine

## 2013-07-22 ENCOUNTER — Other Ambulatory Visit: Payer: No Typology Code available for payment source

## 2013-07-22 LAB — PROTIME-INR
INR: 0.99 (ref <1.50)
Prothrombin Time: 13.1 seconds (ref 11.6–15.2)

## 2013-07-22 NOTE — Telephone Encounter (Signed)
Pt notified that lab results WNL. She told me that she has more bruises coming on her arms. I instructed her to ask the liver doctor about that tomorrow. She noted understanding.

## 2013-07-23 ENCOUNTER — Other Ambulatory Visit: Payer: Self-pay | Admitting: Oncology

## 2013-07-23 DIAGNOSIS — K769 Liver disease, unspecified: Secondary | ICD-10-CM

## 2013-07-24 ENCOUNTER — Encounter: Payer: Self-pay | Admitting: Oncology

## 2013-07-24 ENCOUNTER — Ambulatory Visit (HOSPITAL_BASED_OUTPATIENT_CLINIC_OR_DEPARTMENT_OTHER): Payer: No Typology Code available for payment source | Admitting: Oncology

## 2013-07-24 ENCOUNTER — Other Ambulatory Visit: Payer: No Typology Code available for payment source

## 2013-07-24 ENCOUNTER — Telehealth: Payer: Self-pay | Admitting: Oncology

## 2013-07-24 ENCOUNTER — Ambulatory Visit: Payer: No Typology Code available for payment source

## 2013-07-24 ENCOUNTER — Other Ambulatory Visit (HOSPITAL_BASED_OUTPATIENT_CLINIC_OR_DEPARTMENT_OTHER): Payer: No Typology Code available for payment source

## 2013-07-24 VITALS — BP 181/82 | HR 59 | Temp 97.6°F | Resp 18 | Ht 65.0 in | Wt 138.3 lb

## 2013-07-24 DIAGNOSIS — K7689 Other specified diseases of liver: Secondary | ICD-10-CM

## 2013-07-24 DIAGNOSIS — K7460 Unspecified cirrhosis of liver: Secondary | ICD-10-CM

## 2013-07-24 DIAGNOSIS — C220 Liver cell carcinoma: Secondary | ICD-10-CM

## 2013-07-24 DIAGNOSIS — K769 Liver disease, unspecified: Secondary | ICD-10-CM

## 2013-07-24 LAB — COMPREHENSIVE METABOLIC PANEL (CC13)
ALT: 68 U/L — AB (ref 0–55)
ANION GAP: 9 meq/L (ref 3–11)
AST: 65 U/L — ABNORMAL HIGH (ref 5–34)
Albumin: 4 g/dL (ref 3.5–5.0)
Alkaline Phosphatase: 153 U/L — ABNORMAL HIGH (ref 40–150)
BUN: 9.4 mg/dL (ref 7.0–26.0)
CALCIUM: 9.2 mg/dL (ref 8.4–10.4)
CHLORIDE: 106 meq/L (ref 98–109)
CO2: 28 meq/L (ref 22–29)
Creatinine: 0.7 mg/dL (ref 0.6–1.1)
Glucose: 138 mg/dl (ref 70–140)
Potassium: 4 mEq/L (ref 3.5–5.1)
Sodium: 143 mEq/L (ref 136–145)
Total Bilirubin: 0.54 mg/dL (ref 0.20–1.20)
Total Protein: 7.9 g/dL (ref 6.4–8.3)

## 2013-07-24 LAB — CBC WITH DIFFERENTIAL/PLATELET
BASO%: 0.4 % (ref 0.0–2.0)
BASOS ABS: 0 10*3/uL (ref 0.0–0.1)
EOS%: 1.3 % (ref 0.0–7.0)
Eosinophils Absolute: 0.1 10*3/uL (ref 0.0–0.5)
HEMATOCRIT: 40.2 % (ref 34.8–46.6)
HEMOGLOBIN: 13.2 g/dL (ref 11.6–15.9)
LYMPH#: 2.1 10*3/uL (ref 0.9–3.3)
LYMPH%: 37.4 % (ref 14.0–49.7)
MCH: 30.2 pg (ref 25.1–34.0)
MCHC: 32.8 g/dL (ref 31.5–36.0)
MCV: 92.2 fL (ref 79.5–101.0)
MONO#: 0.6 10*3/uL (ref 0.1–0.9)
MONO%: 10.2 % (ref 0.0–14.0)
NEUT%: 50.7 % (ref 38.4–76.8)
NEUTROS ABS: 2.9 10*3/uL (ref 1.5–6.5)
Platelets: 194 10*3/uL (ref 145–400)
RBC: 4.36 10*6/uL (ref 3.70–5.45)
RDW: 13.7 % (ref 11.2–14.5)
WBC: 5.7 10*3/uL (ref 3.9–10.3)

## 2013-07-24 NOTE — Consult Note (Signed)
Reason for Referral: Liver lesions.   HPI: 58 year old woman native of [place] but currently of [place] since the early 90s. She has history of hypertension as well as chronic liver disease. She was noted to have elevated liver function tests and an ultrasound of the abdomen noted to have multiple liver lesions. An MRI of the abdomen obtained on 07/11/2013 showed multiple large hepatic masses with the largest measuring 5.0 x 5.3 x 4.5 cm the majority of these lesions are in the right lobe of the liver. The liver morphology suggests possible underlying cirrhosis. There was also a mention of a history of hepatitis C as well. Patient was referred to the [facility] for an evaluation as well as evaluation with medical oncology.  Clinically, she is asymptomatic. She has not reported any headaches or blurry vision or double vision. Has not reported any syncope or encephalopathy. She does not report any fevers or chills or sweats or weight loss. Does not report any past or symptoms. Continue to work full time without any decline. She did not report any chest pain orthopnea PND or palpitation. Does not report any cough or hemoptysis or wheezing. Does not report any nausea, vomiting, abdominal pain, distention, hematochezia, melena or change in her bowel habits. He really had any ascites or early satiety. She does not report any frequency urgency or hesitancy. Did not report any skeletal complaints. Does not report any arthralgias or myalgias. She did have some ecchymoses noted on her arm but no easy bruisability or epistaxis. Rest of her review of systems unremarkable.   Past Medical History  Diagnosis Date  . Diabetes mellitus   . Hypertension   :  No past surgical history on file.:   Current Outpatient Prescriptions  Medication Sig Dispense Refill  . amLODipine (NORVASC) 10 MG tablet Take 1 tablet (10 mg total) by mouth daily.  90 tablet  3  . carvedilol (COREG) 12.5 MG tablet Take  12.5 mg by mouth 2 (two) times daily with a meal.      . glucose blood test strip Please dispense glucose test strips for Accu Chek Aviva  to check Blood sugars two to three times daily . Also dispense lancets  100 each  10  . hydrochlorothiazide (HYDRODIURIL) 12.5 MG tablet Take 2 tablets (25 mg total) by mouth daily.  30 tablet  1  . lisinopril (PRINIVIL,ZESTRIL) 20 MG tablet Take 1 tablet (20 mg total) by mouth 2 (two) times daily.  60 tablet  1   No current facility-administered medications for this visit.      No Known Allergies:  No family history of any malignancy or liver disease.   History   Social History  . Marital Status: Legally Separated    Spouse Name: N/A    Number of Children: N/A  . Years of Education: N/A   Occupational History  . Not on file.   Social History Main Topics  . Smoking status: Current Every Day Smoker -- 0.25 packs/day for 30 years    Types: Cigarettes    Start date: 10/21/1971  . Smokeless tobacco: Not on file     Comment: 1 pack per week  . Alcohol Use: Yes     Comment: occasion  . Drug Use: No  . Sexual Activity: Not on file   Other Topics Concern  . Not on file   Social History Narrative  . No narrative on file  :  Pertinent items are noted in HPI.  Exam:  ECOG 0 Blood pressure 181/82, pulse 59, temperature 97.6 F (36.4 C), temperature source Oral, resp. rate 18, height 5\' 5"  (1.651 m), weight 138 lb 4.8 oz (62.732 kg). General appearance: alert and cooperative Head: Normocephalic, without obvious abnormality Throat: lips, mucosa, and tongue normal; teeth and gums normal Neck: no adenopathy Back: negative Resp: clear to auscultation bilaterally Cardio: regular rate and rhythm, S1, S2 normal, no murmur, click, rub or gallop GI: soft, non-tender; bowel sounds normal; no masses,  no organomegaly Extremities: extremities normal, atraumatic, no cyanosis or edema   Recent Labs  07/24/13 1318  WBC 5.7  HGB 13.2  HCT 40.2   PLT 194    Recent Labs  07/24/13 1318  NA 143  K 4.0  CO2 28  GLUCOSE 138  BUN 9.4  CREATININE 0.7  CALCIUM 9.2      Mr Abdomen W Wo Contrast  07/12/2013   CLINICAL DATA:  History of hepatitis-C. Multiple liver lesions noted on recent ultrasound examination.  EXAM: MRI ABDOMEN WITHOUT AND WITH CONTRAST  TECHNIQUE: Multiplanar multisequence MR imaging of the abdomen was performed both before and after the administration of intravenous contrast.  CONTRAST:  6 mL of Eovist.  COMPARISON:  Abdominal ultrasound 06/30/2013.  FINDINGS: Multiple large masses are noted throughout the hepatic parenchyma, largest of which measures approximately 5.0 x 4.3 x 4.5 cm in segment 6. The majority of these lesions on the right lobe of the liver. These lesions are heterogeneously low signal intensity on T1 weighted images, heterogeneously high signal intensity on T2 weighted images (with both lower and higher T1 and T2 signal intensity respectively in the central aspect of the lesions, likely related to internal areas of necrosis), and demonstrate avid arterial phase post gadolinium enhancement with subsequent washout on more delayed phase images, including the 20 minute delayed phase images. The liver has a very slight nodular contour, which could indicate early cirrhosis, however, this is very subtle, and may simply be related to the presence of the underlying masses. There are other hepatic lesions which are low signal intensity on T1 weighted images, high signal intensity on T2 weighted images, and do not enhance, compatible with hepatic cysts. No intrahepatic biliary ductal dilatation. No definite gastrohepatic or portacaval lymphadenopathy.  The appearance of the gallbladder, pancreas, spleen, bilateral adrenal glands and the left kidney is unremarkable. There is a 4.5 x 4.3 x 4.9 cm well-defined lesion in the upper pole of the right kidney which has some thin internal septations, which is low signal intensity  on T1 weighted images, high signal intensity on T2 weighted images, and does not enhance, compatible with a Bosniak class 2 cyst.  IMPRESSION: 1. Multiple large hepatic masses, predominantly throughout the right lobe of the liver, as detailed above. 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 Correlation with AFP levels is recommended. The possibility of multifocal hepatic metastases would warrant consideration should AFP levels be normal. 2. 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   Electronically Signed   By: Vinnie Langton M.D.   On: 07/12/2013 15:18   US Abdomen Limited Ruq  06/30/2013   CLINICAL DATA:  Elevated liver function tests.  Chronic hepatitis-C.  EXAM: US ABDOMEN LIMITED - RIGHT UPPER QUADRANT  COMPARISON:  None.  FINDINGS: Gallbladder:  No gallstones or wall thickening visualized. No sonographic Murphy sign noted.  Common  bile duct:  Diameter: 4 mm  Liver:  Multiple solid liver masses are seen in both the right and left hepatic lobes, largest measuring 4.9 cm. Differential diagnosis includes hepatic metastases and multifocal hepatocellular carcinoma in setting of cirrhosis.  Other:  A large right renal cyst is incidentally noted.  IMPRESSION: Multiple solid liver masses. Differential diagnosis includes hepatic metastases and multifocal hepatocellular carcinoma in setting of cirrhosis. Abdomen MRI without and with contrast recommended for further characterization.  No evidence of gallstones or biliary dilatation.   Electronically Signed   By: Earle Gell M.D.   On: 06/30/2013 09:12    Assessment and Plan:   58 year old woman with the following issues:  1. Multiple hepatic masses throughout the right lobe of the liver largest of which measuring 5.0 x 4.3 x 4.5 cm. In the setting of history of hepatitis C and liver  cirrhosis, this most likely represents hepatocellular carcinoma. Other possibilities would include metastatic carcinoma in the liver but is less likely. Her alpha-fetoprotein is currently pending from today. The natural course of this disease was discussed today and treatment options were discussed as well. I do not think she is a surgical candidate or a candidate for liver transplant at this time. Her is no role for systemic therapy at this time either. I think her best option would be liver directed therapy whether in the form of radio or chemoembolization. I will make you the referral for interventional radiology for an evaluation. She is currently asymptomatic and really does not require any further medical oncology management.  2. Cirrhosis of the liver: She does not have any stigmata of end stage liver disease. She did not have any encephalopathy her ascites. She is followed by her primary care physician as well as the liver Center.  All her questions are answered and I will see her in followup in 4 months to make sure that she is being followed properly.

## 2013-07-24 NOTE — Progress Notes (Signed)
Please see consult note.  

## 2013-07-24 NOTE — Progress Notes (Signed)
Checked in new patient with no financial issues prior to seeing the dr. She has no one to release to. She has appt card and [place] been [place] of the [place].

## 2013-07-24 NOTE — Telephone Encounter (Signed)
gv pt appt schedule for oct. s/w tami @ [facility] re IR eval and mgmt. tami will reroute order from [facility] to [facility] and call pt w/appt. pt aware.

## 2013-07-25 LAB — AFP TUMOR MARKER: AFP TUMOR MARKER: 9.2 ng/mL — AB (ref 0.0–8.0)

## 2013-07-28 ENCOUNTER — Other Ambulatory Visit: Payer: Self-pay | Admitting: *Deleted

## 2013-07-30 ENCOUNTER — Encounter: Payer: Self-pay | Admitting: Oncology

## 2013-07-30 MED ORDER — HYDROCHLOROTHIAZIDE 12.5 MG PO TABS: 25.0000 mg | ORAL_TABLET | Freq: Every day | ORAL | Status: DC

## 2013-07-30 NOTE — Progress Notes (Signed)
Patient left a message about short term disability??? I called but she is at work and said will call bck on break.  She will need to go thru her employer and any forms we can get phy part completed.

## 2013-07-30 NOTE — Telephone Encounter (Signed)
Pt. Needs to come see me, or come in for blood pressure check in the next 1-2 weeks. Blood pressures are very poorly controlled per vitals review. Thanks  Paula Compton, MD Resident, [facility]

## 2013-07-30 NOTE — Telephone Encounter (Signed)
Pt informed to call and schedule an appt with PCP in 1-2 weeks for blood pressure follow up.  Derl Barrow, RN

## 2013-08-06 ENCOUNTER — Other Ambulatory Visit: Payer: Self-pay | Admitting: Emergency Medicine

## 2013-08-06 ENCOUNTER — Ambulatory Visit
Admission: RE | Admit: 2013-08-06 | Discharge: 2013-08-06 | Disposition: A | Discharge status: Home / Self Care | Payer: No Typology Code available for payment source | Source: Ambulatory Visit | Attending: Oncology | Admitting: Oncology

## 2013-08-06 ENCOUNTER — Other Ambulatory Visit (HOSPITAL_COMMUNITY): Payer: Self-pay | Admitting: Interventional Radiology

## 2013-08-06 ENCOUNTER — Encounter (INDEPENDENT_AMBULATORY_CARE_PROVIDER_SITE_OTHER): Payer: Self-pay

## 2013-08-06 DIAGNOSIS — C228 Malignant neoplasm of liver, primary, unspecified as to type: Secondary | ICD-10-CM

## 2013-08-06 DIAGNOSIS — C220 Liver cell carcinoma: Secondary | ICD-10-CM

## 2013-08-06 HISTORY — DX: Liver cell carcinoma: C22.0

## 2013-08-06 NOTE — Consult Note (Signed)
Reason for Consult:hepatocellular carcinoma Referring Physician: [name] is an 58 y.o. female.  HPI: Recent diagnosis of hep C. US demonstrated liver masses. 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 Currently asymptomatic.  Past Medical History  Diagnosis Date  . Diabetes mellitus   . Hypertension   . Hepatocellular carcinoma     Past Surgical History  Procedure Laterality Date  . Cesarean section      No family history on file.  Social History:  reports that she has been smoking Cigarettes.  She started smoking about 41 years ago. She has a 7.5 pack-year smoking history. She does not have any smokeless tobacco history on file. She reports that she drinks alcohol. She reports that she does not use illicit drugs.  Allergies: No Known Allergies  Medications:   Current Outpatient Prescriptions on File Prior to Encounter  Medication Sig Dispense Refill  . amLODipine (NORVASC) 10 MG tablet Take 1 tablet (10 mg total) by mouth daily.  90 tablet  3  . carvedilol (COREG) 12.5 MG tablet Take 12.5 mg by mouth 2 (two) times daily with a meal.      . glucose blood test strip Please dispense glucose test strips for Accu Chek Aviva  to check Blood sugars two to three times daily . Also dispense lancets  100 each  10  . hydrochlorothiazide (HYDRODIURIL) 12.5 MG tablet Take 2 tablets (25 mg total) by mouth daily.  30 tablet  1  . lisinopril (PRINIVIL,ZESTRIL) 20 MG tablet Take 1 tablet (20 mg total) by mouth 2 (two) times daily.  60 tablet  1   No current facility-administered medications on file prior to encounter.   EXAM: MRI ABDOMEN WITHOUT AND WITH CONTRAST  TECHNIQUE: Multiplanar multisequence MR imaging of the abdomen was performed both before and after the administration of intravenous contrast.  CONTRAST: 6 mL of Eovist.  COMPARISON: Abdominal ultrasound 06/30/2013.  FINDINGS: Multiple large masses are noted throughout the hepatic  parenchyma, largest of which measures approximately 5.0 x 4.3 x 4.5 cm in segment 6. The majority of these lesions on the right lobe of the liver. These lesions are heterogeneously low signal intensity on T1 weighted images, heterogeneously high signal intensity on T2 weighted images (with both lower and higher T1 and T2 signal intensity respectively in the central aspect of the lesions, likely related to internal areas of necrosis), and demonstrate avid arterial phase post gadolinium enhancement with subsequent washout on more delayed phase images, including the 20 minute delayed phase images. The liver has a very slight nodular contour, which could indicate early cirrhosis, however, this is very subtle, and may simply be related to the presence of the underlying masses. There are other hepatic lesions which are low signal intensity on T1 weighted images, high signal intensity on T2 weighted images, and do not enhance, compatible with hepatic cysts. No intrahepatic biliary ductal dilatation. No definite gastrohepatic or portacaval lymphadenopathy.  The appearance of the gallbladder, pancreas, spleen, bilateral adrenal glands and the left kidney is unremarkable. There is a 4.5 x 4.3 x 4.9 cm well-defined lesion in the upper pole of the right kidney which has some thin internal septations, which is low signal intensity on T1 weighted images, high signal intensity on T2 weighted images, and does not enhance, compatible with a Bosniak class 2 cyst.  IMPRESSION: 1. Multiple large hepatic masses, predominantly throughout the right lobe of the liver, as detailed above. Given the history of hepatitis-C and morphologic changes  in the liver that are suggestive of early cirrhosis, this is favored to reflect multifocal hepatocellular carcinoma ([place]). Correlation with AFP levels is recommended. The possibility of multifocal hepatic metastases would warrant consideration should AFP levels be  normal. 2. 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  AFP-Tumor Marker  Date Value Ref Range Status  07/24/2013 9.2* 0.0 - 8.0 ng/mL Final      The [employer] Centaur AFP immunoassay method is used.  Results obtainedwith different assay methods or kits cannot be used interchangeably.AFP is a valuable aid in the management of nonseminomatous testicularcancer patients when used in conjunction with      information availablefrom the clinical evaluation and other diagnostic procedures.Increased serum AFP concentrations have also been observed in ataxiatelangiectasia, hereditary tyrosinemia, primary hepatocellularcarcinoma, teratocarcinoma,      gastrointestinal tract cancers with andwithout liver metastases, and in benign hepatic conditions such asacute viral hepatitis, chronic active hepatitis, and cirrhosis.  Thisresult cannot be interpreted as absolute evidence of the presence orabsence of      malignant disease.  This result is not interpretable inpregnant females.   CMP     Component Value Date/Time   NA 143 07/24/2013 1318   NA 137 05/30/2013 1040   K 4.0 07/24/2013 1318   K 4.0 05/30/2013 1040   CL 102 05/30/2013 1040   CO2 28 07/24/2013 1318   CO2 27 05/30/2013 1040   GLUCOSE 138 07/24/2013 1318   GLUCOSE 161* 05/30/2013 1040   BUN 9.4 07/24/2013 1318   BUN 12 05/30/2013 1040   CREATININE 0.7 07/24/2013 1318   CREATININE 0.65 05/30/2013 1040   CREATININE 0.76 01/16/2013 0712   CALCIUM 9.2 07/24/2013 1318   CALCIUM 8.7 05/30/2013 1040   PROT 7.9 07/24/2013 1318   PROT 7.3 05/30/2013 1040   ALBUMIN 4.0 07/24/2013 1318   ALBUMIN 4.0 05/30/2013 1040   AST 65* 07/24/2013 1318   AST 37 05/30/2013 1040   ALT 68* 07/24/2013 1318   ALT 41* 05/30/2013 1040   ALKPHOS 153* 07/24/2013 1318   ALKPHOS 129* 05/30/2013 1040   BILITOT 0.54 07/24/2013 1318   BILITOT 0.5 05/30/2013 1040   GFRNONAA >90 01/16/2013 0712   GFRAA >90 01/16/2013 9528   Recent Results (from the past 2160 hour(s))  POCT GLYCOSYLATED  HEMOGLOBIN (HGB A1C)     Status: None   Collection Time    05/30/13  9:49 AM      Result Value Ref Range   Hemoglobin A1C 6.0    COMPREHENSIVE METABOLIC PANEL     Status: Abnormal   Collection Time    05/30/13 10:40 AM      Result Value Ref Range   Sodium 137  135 - 145 mEq/L   Potassium 4.0  3.5 - 5.3 mEq/L   Chloride 102  96 - 112 mEq/L   CO2 27  19 - 32 mEq/L   Glucose, Bld 161 (*) 70 - 99 mg/dL   BUN 12  6 - 23 mg/dL   Creat 0.65  0.50 - 1.10 mg/dL   Total Bilirubin 0.5  0.2 - 1.2 mg/dL   Alkaline Phosphatase 129 (*) 39 - 117 U/L   AST 37  0 - 37 U/L   ALT 41 (*) 0 - 35 U/L   Total Protein 7.3  6.0 - 8.3 g/dL   Albumin 4.0  3.5 - 5.2 g/dL   Calcium 8.7  8.4 - 10.5 mg/dL  CBC     Status: None  Collection Time    05/30/13 10:40 AM      Result Value Ref Range   WBC 6.2  4.0 - 10.5 K/uL   RBC 4.30  3.87 - 5.11 MIL/uL   Hemoglobin 13.1  12.0 - 15.0 g/dL   HCT 37.4  36.0 - 46.0 %   MCV 87.0  78.0 - 100.0 fL   MCH 30.5  26.0 - 34.0 pg   MCHC 35.0  30.0 - 36.0 g/dL   RDW 13.9  11.5 - 15.5 %   Platelets 202  150 - 400 K/uL  LIPID PANEL     Status: None   Collection Time    05/30/13 10:40 AM      Result Value Ref Range   Cholesterol 124  0 - 200 mg/dL   Comment: ATP III Classification:           < 200        mg/dL        Desirable          200 - 239     mg/dL        Borderline High          >= 240        mg/dL        High         Triglycerides 82  <150 mg/dL   HDL 48  >39 mg/dL   Total CHOL/HDL Ratio 2.6     VLDL 16  0 - 40 mg/dL   LDL Cholesterol 60  0 - 99 mg/dL   Comment:       Total Cholesterol/HDL Ratio:CHD Risk                            Coronary Heart Disease Risk Table                                            Men       Women              1/2 Average Risk              3.4        3.3                  Average Risk              5.0        4.4               2X Average Risk              9.6        7.1               3X Average Risk             23.4       11.0      Use the calculated Patient Ratio above and the CHD Risk table      to determine the patient's CHD Risk.     ATP III Classification (LDL):           < 100        mg/dL         Optimal          100 - 129  mg/dL         Near or Above Optimal          130 - 159     mg/dL         Borderline High          160 - 189     mg/dL         High           > 190        mg/dL         Very High        PROTIME-INR     Status: None   Collection Time    06/11/13 10:08 AM      Result Value Ref Range   Prothrombin Time 13.6  11.6 - 15.2 seconds   INR 1.05  <1.50   Comment: The INR is of principal utility in following patients on stable doses     of oral anticoagulants.  The therapeutic range is generally 2.0 to     3.0, but may be 3.0 to 4.0 in patients with mechanical cardiac valves,     recurrent embolisms and antiphospholipid antibodies (including lupus     inhibitors).  HEPATITIS C ANTIBODY     Status: Abnormal   Collection Time    06/11/13 10:10 AM      Result Value Ref Range   HCV Ab REACTIVE (*) NEGATIVE   Comment:                                                                            This test is for screening purposes only.  Reactive results should be     confirmed by an alternative method.  Suggest HCV Qualitative, PCR,     test code 83130.  Specimens will be stable for reflex testing up to 3     days after collection.  HIV ANTIBODY (ROUTINE TESTING)     Status: None   Collection Time    06/11/13 10:10 AM      Result Value Ref Range   HIV 1&2 Ab, 4th Generation NONREACTIVE  NONREACTIVE   Comment:       A NONREACTIVE HIV Ag/Ab result does not exclude HIV infection since     the time frame for seroconversion is variable. If acute HIV infection     is suspected, a HIV-1 RNA Qualitative TMA test is recommended.           HIV-1/2 Antibody Diff         Not indicated.     HIV-1 RNA, Qual TMA           Not indicated.           PLEASE NOTE: This information has been disclosed to you  from records     whose confidentiality may be protected by state law. If your state     requires such protection, then the state law prohibits you from making     any further disclosure of the information without the specific written     consent of the person to whom it pertains, or as otherwise permitted     by law. A general authorization for the release of  medical or other     information is NOT sufficient for this purpose.           The performance of this assay has not been clinically validated in     patients less than 104 years old.  GAMMA GT     Status: Abnormal   Collection Time    06/11/13 10:10 AM      Result Value Ref Range   GGT 151 (*) 7 - 51 U/L  HEPATITIS B SURFACE ANTIGEN     Status: None   Collection Time    06/11/13  2:21 PM      Result Value Ref Range   Hepatitis B Surface Ag NEGATIVE  NEGATIVE  HEPATITIS B CORE ANTIBODY, TOTAL     Status: None   Collection Time    06/11/13  2:21 PM      Result Value Ref Range   Hep B Core Total Ab NON REACTIVE  NON REACTIVE   Comment: Result repeated and verified.  HEPATITIS C RNA QUANTITATIVE     Status: Abnormal   Collection Time    06/24/13  4:49 PM      Result Value Ref Range   HCV Quantitative [id number] (*) <15 IU/mL   HCV Quantitative Log 6.16 (*) <1.18 log 10   Comment:       This test utilizes the [place] FDA approved Roche HCV Test Kit by RT-PCR.  HEPATITIS C GENOTYPE     Status: None   Collection Time    06/24/13  4:49 PM      Result Value Ref Range   HCV Genotype 1a     Comment: Test Methodology: Abbott RealTime HCV Genotype II Assay.  HEPATITIS A ANTIBODY, TOTAL     Status: Abnormal   Collection Time    06/24/13  4:49 PM      Result Value Ref Range   Hep A Total Ab REACTIVE (*) NON REACTIVE  CBC WITH DIFFERENTIAL     Status: Abnormal   Collection Time    07/21/13 12:37 PM      Result Value Ref Range   WBC 5.8  4.0 - 10.5 K/uL   RBC 4.05  3.87 - 5.11 MIL/uL   Hemoglobin 12.3  12.0 - 15.0 g/dL   HCT 35.1 (*)  36.0 - 46.0 %   MCV 86.7  78.0 - 100.0 fL   MCH 30.4  26.0 - 34.0 pg   MCHC 35.0  30.0 - 36.0 g/dL   RDW 13.5  11.5 - 15.5 %   Platelets 201  150 - 400 K/uL   Neutrophils Relative % 54  43 - 77 %   Neutro Abs 3.1  1.7 - 7.7 K/uL   Lymphocytes Relative 38  12 - 46 %   Lymphs Abs 2.2  0.7 - 4.0 K/uL   Monocytes Relative 7  3 - 12 %   Monocytes Absolute 0.4  0.1 - 1.0 K/uL   Eosinophils Relative 1  0 - 5 %   Eosinophils Absolute 0.1  0.0 - 0.7 K/uL   Basophils Relative 0  0 - 1 %   Basophils Absolute 0.0  0.0 - 0.1 K/uL   Smear Review Criteria for review not met    PROTIME-INR     Status: None   Collection Time    07/21/13 12:38 PM      Result Value Ref Range   Prothrombin Time 13.1  11.6 - 15.2 seconds   INR 0.99  <1.50  Comment: The INR is of principal utility in following patients on stable doses     of oral anticoagulants.  The therapeutic range is generally 2.0 to     3.0, but may be 3.0 to 4.0 in patients with mechanical cardiac valves,     recurrent embolisms and antiphospholipid antibodies (including lupus     inhibitors).  CBC WITH DIFFERENTIAL     Status: None   Collection Time    07/24/13  1:18 PM      Result Value Ref Range   WBC 5.7  3.9 - 10.3 10e3/uL   NEUT# 2.9  1.5 - 6.5 10e3/uL   HGB 13.2  11.6 - 15.9 g/dL   HCT 40.2  34.8 - 46.6 %   Platelets 194  145 - 400 10e3/uL   MCV 92.2  79.5 - 101.0 fL   MCH 30.2  25.1 - 34.0 pg   MCHC 32.8  31.5 - 36.0 g/dL   RBC 4.36  3.70 - 5.45 10e6/uL   RDW 13.7  11.2 - 14.5 %   lymph# 2.1  0.9 - 3.3 10e3/uL   MONO# 0.6  0.1 - 0.9 10e3/uL   Eosinophils Absolute 0.1  0.0 - 0.5 10e3/uL   Basophils Absolute 0.0  0.0 - 0.1 10e3/uL   NEUT% 50.7  38.4 - 76.8 %   LYMPH% 37.4  14.0 - 49.7 %   MONO% 10.2  0.0 - 14.0 %   EOS% 1.3  0.0 - 7.0 %   BASO% 0.4  0.0 - 2.0 %  AFP TUMOR MARKER     Status: Abnormal   Collection Time    07/24/13  1:18 PM      Result Value Ref Range   AFP-Tumor Marker 9.2 (*) 0.0 - 8.0 ng/mL   Comment:   The [employer] Centaur AFP immunoassay method is used.  Results obtainedwith different assay methods or kits cannot be used interchangeably.AFP is a valuable aid in the management of nonseminomatous testicularcancer patients when used in conjunction with      information availablefrom the clinical evaluation and other diagnostic procedures.Increased serum AFP concentrations have also been observed in ataxiatelangiectasia, hereditary tyrosinemia, primary hepatocellularcarcinoma, teratocarcinoma,      gastrointestinal tract cancers with andwithout liver metastases, and in benign hepatic conditions such asacute viral hepatitis, chronic active hepatitis, and cirrhosis.  Thisresult cannot be interpreted as absolute evidence of the presence orabsence of      malignant disease.  This result is not interpretable inpregnant females.  COMPREHENSIVE METABOLIC PANEL (IZ12)     Status: Abnormal   Collection Time    07/24/13  1:18 PM      Result Value Ref Range   Sodium 143  136 - 145 mEq/L   Potassium 4.0  3.5 - 5.1 mEq/L   Chloride 106  98 - 109 mEq/L   CO2 28  22 - 29 mEq/L   Glucose 138  70 - 140 mg/dl   BUN 9.4  7.0 - 26.0 mg/dL   Creatinine 0.7  0.6 - 1.1 mg/dL   Total Bilirubin 0.54  0.20 - 1.20 mg/dL   Alkaline Phosphatase 153 (*) 40 - 150 U/L   AST 65 (*) 5 - 34 U/L   ALT 68 (*) 0 - 55 U/L   Total Protein 7.9  6.4 - 8.3 g/dL   Albumin 4.0  3.5 - 5.0 g/dL   Calcium 9.2  8.4 - 10.4 mg/dL   Anion Gap 9  3 - 11 mEq/L  CBC    Component Value Date/Time   WBC 5.7 07/24/2013 1318   WBC 5.8 07/21/2013 1237   RBC 4.36 07/24/2013 1318   RBC 4.05 07/21/2013 1237   HGB 13.2 07/24/2013 1318   HGB 12.3 07/21/2013 1237   HCT 40.2 07/24/2013 1318   HCT 35.1* 07/21/2013 1237   PLT 194 07/24/2013 1318   PLT 201 07/21/2013 1237   MCV 92.2 07/24/2013 1318   MCV 86.7 07/21/2013 1237   MCH 30.2 07/24/2013 1318   MCH 30.4 07/21/2013 1237   MCHC 32.8 07/24/2013 1318   MCHC 35.0 07/21/2013 1237   RDW 13.7 07/24/2013 1318   RDW  13.5 07/21/2013 1237   LYMPHSABS 2.1 07/24/2013 1318   LYMPHSABS 2.2 07/21/2013 1237   MONOABS 0.6 07/24/2013 1318   MONOABS 0.4 07/21/2013 1237   EOSABS 0.1 07/24/2013 1318   EOSABS 0.1 07/21/2013 1237   BASOSABS 0.0 07/24/2013 1318   BASOSABS 0.0 07/21/2013 1237    History   Social History  . Marital Status: Legally Separated    Spouse Name: N/A    Number of Children: N/A  . Years of Education: N/A   Occupational History    Works at [employer].    Social History Main Topics  . Smoking status: Current Every Day Smoker -- 0.25 packs/day for 30 years    Types: Cigarettes    Start date: 10/21/1971  . Smokeless tobacco: Not on file     Comment: 1 pack per week  . Alcohol Use: Yes     Comment: occasion  . Drug Use: No  . Sexual Activity: Not on file   Other Topics Concern  . Not on file   Social History Narrative  . No narrative on file     No results found.  Review of Systems  Constitutional: Positive for weight loss.  HENT: Negative.   Gastrointestinal: Negative.   Musculoskeletal: Negative.   Skin: Negative.   Neurological: Negative.   Psychiatric/Behavioral: Negative.    There were no vitals taken for this visit. Physical Exam  Constitutional: She is oriented to person, place, and time. She appears well-developed and well-nourished. No distress.  HENT:  Head: Normocephalic.  Eyes: EOM are normal. No scleral icterus.  Neck: Normal range of motion.  Respiratory: Effort normal. No stridor. No respiratory distress.  GI: Soft. She exhibits no distension. There is no tenderness.  Neurological: She is alert and oriented to person, place, and time. Coordination normal.  Skin: Skin is warm and dry. No rash noted. She is not diaphoretic. No erythema.  Psychiatric: She has a normal mood and affect. Her behavior is normal. Judgment and thought content normal.   ECOG 0 Assessment/Plan: Incidentally discovered multifocal HCCa. Number of lesions precludes percutaneous  ablation. We discussed that only transplant would be curative. She would be a good candidate for intra-aterial catheter directed therapy for disease management. Given relatively good liver function, would recommend Y90 radioembolization over bland or chemo-embolization. Discussed pathophysiology of HCCa, Y90 radioembo technique, anticipated benefits, risks and possible complications, post-embo syndrome, and need for continued surveillance. She understood and asked appropriate questions. She is motivated to proceed. Accordingly, we can set this up at her convenience, pending carrier approval.  Beniah Magnan III,DAYNE Armanie Martine 08/06/2013, 4:45 PM

## 2013-08-06 NOTE — Progress Notes (Unsigned)
Patient ID: Whitney Patel, female   DOB: [birth year], 58 y.o.   MRN: [id number]

## 2013-08-07 ENCOUNTER — Other Ambulatory Visit (HOSPITAL_COMMUNITY): Payer: Self-pay | Admitting: Interventional Radiology

## 2013-08-07 DIAGNOSIS — C228 Malignant neoplasm of liver, primary, unspecified as to type: Secondary | ICD-10-CM

## 2013-08-08 ENCOUNTER — Ambulatory Visit: Payer: No Typology Code available for payment source | Admitting: Family Medicine

## 2013-08-08 ENCOUNTER — Ambulatory Visit (HOSPITAL_COMMUNITY)
Admission: RE | Admit: 2013-08-08 | Discharge: 2013-08-08 | Disposition: A | Discharge status: Home / Self Care | Payer: No Typology Code available for payment source | Source: Ambulatory Visit | Attending: Interventional Radiology | Admitting: Interventional Radiology

## 2013-08-11 ENCOUNTER — Encounter (HOSPITAL_COMMUNITY): Payer: Self-pay

## 2013-08-11 ENCOUNTER — Ambulatory Visit (HOSPITAL_COMMUNITY)
Admission: RE | Admit: 2013-08-11 | Discharge: 2013-08-11 | Disposition: A | Discharge status: Home / Self Care | Payer: No Typology Code available for payment source | Source: Ambulatory Visit | Attending: Interventional Radiology | Admitting: Interventional Radiology

## 2013-08-11 ENCOUNTER — Ambulatory Visit (HOSPITAL_COMMUNITY): Admission: RE | Admit: 2013-08-11 | Payer: No Typology Code available for payment source | Source: Ambulatory Visit

## 2013-08-11 DIAGNOSIS — I517 Cardiomegaly: Secondary | ICD-10-CM | POA: Insufficient documentation

## 2013-08-11 DIAGNOSIS — B1920 Unspecified viral hepatitis C without hepatic coma: Secondary | ICD-10-CM | POA: Insufficient documentation

## 2013-08-11 DIAGNOSIS — N281 Cyst of kidney, acquired: Secondary | ICD-10-CM | POA: Insufficient documentation

## 2013-08-11 DIAGNOSIS — C228 Malignant neoplasm of liver, primary, unspecified as to type: Secondary | ICD-10-CM | POA: Insufficient documentation

## 2013-08-11 DIAGNOSIS — I7789 Other specified disorders of arteries and arterioles: Secondary | ICD-10-CM | POA: Insufficient documentation

## 2013-08-11 DIAGNOSIS — K7460 Unspecified cirrhosis of liver: Secondary | ICD-10-CM | POA: Insufficient documentation

## 2013-08-11 DIAGNOSIS — I7090 Unspecified atherosclerosis: Secondary | ICD-10-CM | POA: Insufficient documentation

## 2013-08-11 DIAGNOSIS — I81 Portal vein thrombosis: Secondary | ICD-10-CM | POA: Insufficient documentation

## 2013-08-11 DIAGNOSIS — J9819 Other pulmonary collapse: Secondary | ICD-10-CM | POA: Insufficient documentation

## 2013-08-11 MED ORDER — IOHEXOL 350 MG/ML SOLN
100.0000 mL | Freq: Once | INTRAVENOUS | Status: AC | PRN
  Administered 2013-08-11: 100 mL via INTRAVENOUS

## 2013-08-14 ENCOUNTER — Encounter (HOSPITAL_COMMUNITY): Payer: Self-pay | Admitting: Pharmacy Technician

## 2013-08-14 ENCOUNTER — Other Ambulatory Visit: Payer: Self-pay | Admitting: Radiology

## 2013-08-18 ENCOUNTER — Encounter (HOSPITAL_COMMUNITY): Payer: No Typology Code available for payment source

## 2013-08-18 ENCOUNTER — Ambulatory Visit (HOSPITAL_COMMUNITY): Admission: RE | Admit: 2013-08-18 | Payer: No Typology Code available for payment source | Source: Ambulatory Visit

## 2013-08-18 ENCOUNTER — Inpatient Hospital Stay (HOSPITAL_COMMUNITY): Admission: RE | Admit: 2013-08-18 | Payer: No Typology Code available for payment source | Source: Ambulatory Visit

## 2013-08-19 ENCOUNTER — Telehealth: Payer: Self-pay | Admitting: Emergency Medicine

## 2013-08-19 NOTE — Telephone Encounter (Signed)
CALLED PT TO MAKE HER AWARE THAT HER INSURANCE REQUIRES HER TO HAVE A LIVER BX PRIOR TO Y-90 TREATMENT. EXPLAINED WE NEEDED IT ASAP AND SOMEONE FROM [facility] SCHEDULING WILL CONTACT HER TO SET UP THE APPT.

## 2013-08-20 ENCOUNTER — Other Ambulatory Visit: Payer: Self-pay | Admitting: Radiology

## 2013-08-20 ENCOUNTER — Encounter (HOSPITAL_COMMUNITY): Payer: Self-pay | Admitting: Pharmacy Technician

## 2013-08-20 ENCOUNTER — Ambulatory Visit: Payer: No Typology Code available for payment source | Admitting: Family Medicine

## 2013-08-21 ENCOUNTER — Other Ambulatory Visit: Payer: Self-pay | Admitting: Radiology

## 2013-08-22 ENCOUNTER — Encounter (HOSPITAL_COMMUNITY): Payer: Self-pay | Admitting: Pharmacy Technician

## 2013-08-25 ENCOUNTER — Other Ambulatory Visit: Payer: Self-pay | Admitting: *Deleted

## 2013-08-25 ENCOUNTER — Ambulatory Visit (HOSPITAL_COMMUNITY): Payer: No Typology Code available for payment source

## 2013-08-25 ENCOUNTER — Ambulatory Visit (HOSPITAL_COMMUNITY)
Admission: RE | Admit: 2013-08-25 | Discharge: 2013-08-25 | Disposition: A | Discharge status: Home / Self Care | Payer: No Typology Code available for payment source | Source: Ambulatory Visit | Attending: Interventional Radiology | Admitting: Interventional Radiology

## 2013-08-25 ENCOUNTER — Encounter (HOSPITAL_COMMUNITY): Payer: Self-pay

## 2013-08-25 ENCOUNTER — Other Ambulatory Visit (HOSPITAL_COMMUNITY): Payer: Self-pay | Admitting: Interventional Radiology

## 2013-08-25 DIAGNOSIS — B1920 Unspecified viral hepatitis C without hepatic coma: Secondary | ICD-10-CM | POA: Insufficient documentation

## 2013-08-25 DIAGNOSIS — C228 Malignant neoplasm of liver, primary, unspecified as to type: Secondary | ICD-10-CM

## 2013-08-25 LAB — COMPREHENSIVE METABOLIC PANEL
ALK PHOS: 159 U/L — AB (ref 39–117)
ALT: 78 U/L — ABNORMAL HIGH (ref 0–35)
AST: 87 U/L — AB (ref 0–37)
Albumin: 4 g/dL (ref 3.5–5.2)
Anion gap: 13 (ref 5–15)
BILIRUBIN TOTAL: 0.5 mg/dL (ref 0.3–1.2)
BUN: 10 mg/dL (ref 6–23)
CO2: 27 meq/L (ref 19–32)
CREATININE: 0.66 mg/dL (ref 0.50–1.10)
Calcium: 9.4 mg/dL (ref 8.4–10.5)
Chloride: 103 mEq/L (ref 96–112)
GFR calc Af Amer: >90 mL/min (ref >90)
GFR calc non Af Amer: >90 mL/min (ref >90)
Glucose, Bld: 110 mg/dL — ABNORMAL HIGH (ref 70–99)
Potassium: 3.8 mEq/L (ref 3.7–5.3)
Sodium: 143 mEq/L (ref 137–147)
Total Protein: 8.1 g/dL (ref 6.0–8.3)

## 2013-08-25 LAB — CBC WITH DIFFERENTIAL/PLATELET
Basophils Absolute: 0 10*3/uL (ref 0.0–0.1)
Basophils Relative: 0 % (ref 0–1)
Eosinophils Absolute: 0 10*3/uL (ref 0.0–0.7)
Eosinophils Relative: 1 % (ref 0–5)
HEMATOCRIT: 36.2 % (ref 36.0–46.0)
HEMOGLOBIN: 12.2 g/dL (ref 12.0–15.0)
Lymphocytes Relative: 36 % (ref 12–46)
Lymphs Abs: 2 10*3/uL (ref 0.7–4.0)
MCH: 29.8 pg (ref 26.0–34.0)
MCHC: 33.7 g/dL (ref 30.0–36.0)
MCV: 88.5 fL (ref 78.0–100.0)
MONO ABS: 0.5 10*3/uL (ref 0.1–1.0)
MONOS PCT: 9 % (ref 3–12)
NEUTROS ABS: 3.1 10*3/uL (ref 1.7–7.7)
Neutrophils Relative %: 54 % (ref 43–77)
Platelets: 216 10*3/uL (ref 150–400)
RBC: 4.09 MIL/uL (ref 3.87–5.11)
RDW: 13.2 % (ref 11.5–15.5)
WBC: 5.6 10*3/uL (ref 4.0–10.5)

## 2013-08-25 LAB — PROTIME-INR
INR: 0.97 (ref 0.00–1.49)
PROTHROMBIN TIME: 12.9 s (ref 11.6–15.2)

## 2013-08-25 LAB — GLUCOSE, CAPILLARY: Glucose-Capillary: 112 mg/dL — ABNORMAL HIGH (ref 70–99)

## 2013-08-25 LAB — APTT: APTT: 29 s (ref 24–37)

## 2013-08-25 MED ORDER — FENTANYL CITRATE 0.05 MG/ML IJ SOLN
INTRAMUSCULAR | Status: AC
  Filled 2013-08-25: qty 6

## 2013-08-25 MED ORDER — MIDAZOLAM HCL 2 MG/2ML IJ SOLN
INTRAMUSCULAR | Status: AC
  Filled 2013-08-25: qty 6

## 2013-08-25 MED ORDER — MIDAZOLAM HCL 2 MG/2ML IJ SOLN
INTRAMUSCULAR | Status: AC | PRN
  Administered 2013-08-25: 1 mg via INTRAVENOUS
  Administered 2013-08-25 (×2): 0.5 mg via INTRAVENOUS
  Administered 2013-08-25: 1 mg via INTRAVENOUS

## 2013-08-25 MED ORDER — SODIUM CHLORIDE 0.9 % IV SOLN
INTRAVENOUS | Status: DC
  Administered 2013-08-25: 11:00:00 via INTRAVENOUS

## 2013-08-25 MED ORDER — FENTANYL CITRATE 0.05 MG/ML IJ SOLN
INTRAMUSCULAR | Status: AC | PRN
  Administered 2013-08-25 (×2): 50 ug via INTRAVENOUS

## 2013-08-25 NOTE — Discharge Instructions (Signed)
Liver Biopsy, Care After °Refer to this sheet in the next few weeks. These instructions provide you with information on caring for yourself after your procedure. Your health care provider may also give you more specific instructions. Your treatment has been planned according to current medical practices, but problems sometimes occur. Call your health care provider if you have any problems or questions after your procedure. °WHAT TO EXPECT AFTER THE PROCEDURE °After your procedure, it is typical to have the following: °· A small amount of discomfort in the area where the biopsy was done and in the right shoulder or shoulder blade. °· A small amount of bruising around the area where the biopsy was done and on the skin over the liver. °· Sleepiness and fatigue for the rest of the day. °HOME CARE INSTRUCTIONS  °· Rest at home for 1-2 days or as directed by your health care provider. °· Have a friend or family member stay with you for at least 24 hours. °· Because of the medicines used during the procedure, you should not do the following things in the first 24 hours: °¨ Drive. °¨ Use machinery. °¨ Be responsible for the care of other people. °¨ Sign legal documents. °¨ Take a bath or shower. °· There are many different ways to close and cover an incision, including stitches, skin glue, and adhesive strips. Follow your health care provider's instructions on: °¨ Incision care. °¨ Bandage (dressing) changes and removal. °¨ Incision closure removal. °· Do not drink alcohol in the first week. °· Do not lift more than 5 pounds or play contact sports for 2 weeks after this test. °· Take medicines only as directed by your health care provider. Do not take medicine containing aspirin or non-steroidal anti-inflammatory medicines such as ibuprofen for 1 week after this test. °· It is your responsibility to get your test results. °SEEK MEDICAL CARE IF:  °· You have increased bleeding from an incision that results in more than a  small spot of blood. °· You have redness, swelling, or increasing pain in any incisions. °· You notice a discharge or a bad smell coming from any of your incisions. °· You have a fever or chills. °SEEK IMMEDIATE MEDICAL CARE IF:  °· You develop swelling, bloating, or pain in your abdomen. °· You become dizzy or faint. °· You develop a rash. °· You are nauseous or vomit. °· You have difficulty breathing, feel short of breath, or feel faint. °· You develop chest pain. °· You have problems with your speech or vision. °· You have trouble balancing or moving your arms or legs. °Document Released: 07/29/2004 Document Revised: 05/26/2013 Document Reviewed: 03/07/2013 °ExitCare® Patient Information ©2015 [employer]. This information is not intended to replace advice given to you by your health care provider. Make sure you discuss any questions you have with your health care provider. °Conscious Sedation °Sedation is the use of medicines to promote relaxation and relieve discomfort and anxiety. Conscious sedation is a type of sedation. Under conscious sedation you are less alert than normal but are still able to respond to instructions or stimulation. Conscious sedation is used during short medical and dental procedures. It is milder than deep sedation or general anesthesia and allows you to return to your regular activities sooner.  °LET YOUR HEALTH CARE PROVIDER KNOW ABOUT:  °· Any allergies you have. °· All medicines you are taking, including vitamins, herbs, eye drops, creams, and over-the-counter medicines. °· Use of steroids (by mouth or creams). °·   Previous problems you or members of your family have had with the use of anesthetics. °· Any blood disorders you have. °· Previous surgeries you have had. °· Medical conditions you have. °· Possibility of pregnancy, if this applies. °· Use of cigarettes, alcohol, or illegal drugs. °RISKS AND COMPLICATIONS °Generally, this is a safe procedure. However, as with any  procedure, problems can occur. Possible problems include: °· Oversedation. °· Trouble breathing on your own. You may need to have a breathing tube until you are awake and breathing on your own. °· Allergic reaction to any of the medicines used for the procedure. °BEFORE THE PROCEDURE °· You may have blood tests done. These tests can help show how well your kidneys and liver are working. They can also show how well your blood clots. °· A physical exam will be done.   °· Only take medicines as directed by your health care provider. You may need to stop taking medicines (such as blood thinners, aspirin, or nonsteroidal anti-inflammatory drugs) before the procedure.   °· Do not eat or drink at least 6 hours before the procedure or as directed by your health care provider. °· Arrange for a responsible adult, family member, or friend to take you home after the procedure. He or she should stay with you for at least 24 hours after the procedure, until the medicine has worn off. °PROCEDURE  °· An intravenous (IV) catheter will be inserted into one of your veins. Medicine will be able to flow directly into your body through this catheter. You may be given medicine through this tube to help prevent pain and help you relax. °· The medical or dental procedure will be done. °AFTER THE PROCEDURE °· You will stay in a recovery area until the medicine has worn off. Your blood pressure and pulse will be checked.   °·  Depending on the procedure you had, you may be allowed to go home when you can tolerate liquids and your pain is under control. °Document Released: 10/04/2000 Document Revised: 01/14/2013 Document Reviewed: 09/16/2012 °ExitCare® Patient Information ©2015 [employer]. This information is not intended to replace advice given to you by your health care provider. Make sure you discuss any questions you have with your health care provider. ° °

## 2013-08-25 NOTE — H&P (Signed)
Chief Complaint: "I'm here for liver biopsy" HPI: Whitney Patel is an 58 y.o. female with Hep C and findings of liver masses highly concerning for hepatocellular carcinoma. She has seen Dr. Vernard Gambles in consult and may be a good candidate for radio or chemoembolization. She will require a biopsy to confirm diagnosis. PMHx, meds, labs reviewed.   Past Medical History:  Past Medical History  Diagnosis Date  . Diabetes mellitus   . Hypertension   . Hepatocellular carcinoma     Past Surgical History:  Past Surgical History  Procedure Laterality Date  . Cesarean section      Family History: History reviewed. No pertinent family history.  Social History:  reports that she has been smoking Cigarettes.  She started smoking about 41 years ago. She has a 7.5 pack-year smoking history. She does not have any smokeless tobacco history on file. She reports that she drinks alcohol. She reports that she does not use illicit drugs.  Allergies: No Known Allergies  Medications:   Medication List    ASK your doctor about these medications       amLODipine 10 MG tablet  Commonly known as:  NORVASC  Take 10 mg by mouth every morning.     carvedilol 12.5 MG tablet  Commonly known as:  COREG  Take 12.5 mg by mouth 2 (two) times daily with a meal.     glucose blood test strip  - Please dispense glucose test strips for Accu Chek Aviva  to check Blood sugars two to three times daily .  - Also dispense lancets     hydrochlorothiazide 12.5 MG capsule  Commonly known as:  MICROZIDE  Take 12.5 mg by mouth 2 (two) times daily.     lisinopril 20 MG tablet  Commonly known as:  PRINIVIL,ZESTRIL  Take 1 tablet (20 mg total) by mouth 2 (two) times daily.     metFORMIN 500 MG tablet  Commonly known as:  GLUCOPHAGE  Take 1,000 mg by mouth 2 (two) times daily with a meal.        Please HPI for pertinent positives, otherwise complete 10 system ROS negative.  Physical Exam: BP 174/88  Pulse 57   Temp(Src) 99.4 F (37.4 C) (Oral)  Resp 16  SpO2 97% There is no weight on file to calculate BMI.   General Appearance:  Alert, cooperative, no distress, appears stated age  Head:  Normocephalic, without obvious abnormality, atraumatic  ENT: Unremarkable  Neck: Supple, symmetrical, trachea midline  Lungs:   Clear to auscultation bilaterally, no w/r/r, respirations unlabored without use of accessory muscles.  Chest Wall:  No tenderness or deformity  Heart:  Regular rate and rhythm, S1, S2 normal, no murmur, rub or gallop.  Abdomen:   Soft, non-tender, non distended.  Neurologic: Normal affect, no gross deficits.   Results for orders placed during the hospital encounter of 08/25/13 (from the past 48 hour(s))  APTT     Status: None   Collection Time    08/25/13 11:11 AM      Result Value Ref Range   aPTT 29  24 - 37 seconds  CBC WITH DIFFERENTIAL     Status: None   Collection Time    08/25/13 11:11 AM      Result Value Ref Range   WBC 5.6  4.0 - 10.5 K/uL   RBC 4.09  3.87 - 5.11 MIL/uL   Hemoglobin 12.2  12.0 - 15.0 g/dL   HCT 36.2  36.0 - 46.0 %  MCV 88.5  78.0 - 100.0 fL   MCH 29.8  26.0 - 34.0 pg   MCHC 33.7  30.0 - 36.0 g/dL   RDW 13.2  11.5 - 15.5 %   Platelets 216  150 - 400 K/uL   Neutrophils Relative % 54  43 - 77 %   Neutro Abs 3.1  1.7 - 7.7 K/uL   Lymphocytes Relative 36  12 - 46 %   Lymphs Abs 2.0  0.7 - 4.0 K/uL   Monocytes Relative 9  3 - 12 %   Monocytes Absolute 0.5  0.1 - 1.0 K/uL   Eosinophils Relative 1  0 - 5 %   Eosinophils Absolute 0.0  0.0 - 0.7 K/uL   Basophils Relative 0  0 - 1 %   Basophils Absolute 0.0  0.0 - 0.1 K/uL  COMPREHENSIVE METABOLIC PANEL     Status: Abnormal   Collection Time    08/25/13 11:11 AM      Result Value Ref Range   Sodium 143  137 - 147 mEq/L   Potassium 3.8  3.7 - 5.3 mEq/L   Chloride 103  96 - 112 mEq/L   CO2 27  19 - 32 mEq/L   Glucose, Bld 110 (*) 70 - 99 mg/dL   BUN 10  6 - 23 mg/dL   Creatinine, Ser 0.66  0.50  - 1.10 mg/dL   Calcium 9.4  8.4 - 10.5 mg/dL   Total Protein 8.1  6.0 - 8.3 g/dL   Albumin 4.0  3.5 - 5.2 g/dL   AST 87 (*) 0 - 37 U/L   ALT 78 (*) 0 - 35 U/L   Alkaline Phosphatase 159 (*) 39 - 117 U/L   Total Bilirubin 0.5  0.3 - 1.2 mg/dL   GFR calc non Af Amer >90  >90 mL/min   GFR calc Af Amer >90  >90 mL/min   Comment: (NOTE)     The eGFR has been calculated using the CKD EPI equation.     This calculation has not been validated in all clinical situations.     eGFR's persistently <90 mL/min signify possible Chronic Kidney     Disease.   Anion gap 13  5 - 15  PROTIME-INR     Status: None   Collection Time    08/25/13 11:11 AM      Result Value Ref Range   Prothrombin Time 12.9  11.6 - 15.2 seconds   INR 0.97  0.00 - 1.49   No results found.  Assessment/Plan Multiple large liver masses concerning for [facility] For US biopsy today Explained procedure, risks, complications, use of sedation. Labs reviewed. Consent signed in chart  Ascencion Dike PA-C 08/25/2013, 12:34 PM

## 2013-08-25 NOTE — Progress Notes (Signed)
Pt drank a soda when she returned from procedure.  Pt drank it fast and now is nauseated and vomited small amount of liquid, brownish in color (pt had a coke).  Informed pt to not eat or drink for a while and allow her stomach to rest.  Pt voiced understanding.

## 2013-08-25 NOTE — Progress Notes (Signed)
Pt states she is feeling better and N/V has improved.  Pt resting in bed.  Her bandaid to her right side remains dry and intact.

## 2013-08-25 NOTE — H&P (Signed)
Agree.  For liver biopsy today under US guidance.

## 2013-08-25 NOTE — Progress Notes (Signed)
Pt feeling a little nausea, but has not vomited no more.  Informed pt to eat a light dinner, example of soup given to pt.  Pt voiced understanding.

## 2013-08-25 NOTE — Procedures (Signed)
Procedure:  Ultrasound guided biopsy of liver Findings:  Inferior right lobe hepatic lesion sampled via 17 G needle with 18 G core biopsy x 4.  Gelfoam pledgets advanced through needle after biopsy. Plan:  3 hour recovery.

## 2013-08-26 ENCOUNTER — Other Ambulatory Visit: Payer: Self-pay | Admitting: Radiology

## 2013-08-26 MED ORDER — CARVEDILOL 12.5 MG PO TABS: 12.5000 mg | ORAL_TABLET | Freq: Two times a day (BID) | ORAL | Status: DC

## 2013-08-26 NOTE — Telephone Encounter (Signed)
I will refill patient's Coreg but please ask that she make (and keep) an appt w/ me within the next month or 2.  Thank you!  -[name]

## 2013-08-27 ENCOUNTER — Telehealth: Payer: Self-pay | Admitting: Family Medicine

## 2013-08-27 ENCOUNTER — Ambulatory Visit (HOSPITAL_COMMUNITY): Payer: No Typology Code available for payment source

## 2013-08-27 NOTE — Telephone Encounter (Signed)
Pt informed of refill and to call for an appt with PCP in next month or two.  Pt stated understanding.  Derl Barrow, RN

## 2013-08-27 NOTE — Telephone Encounter (Signed)
Pt advised to call the clinic back where she had biopsy regarding pain in that area.  If needed she should come to [facility] for f/u appt.  Pt stated she was going to call that clinic back; biopsy completed on Monday.  Derl Barrow, RN

## 2013-08-27 NOTE — Telephone Encounter (Signed)
Please contact patient back regarding pain she's having.  Wanted to know if she could use something topical for it.  Recently had a liver biopsy and is having discomfort on that side.

## 2013-08-29 ENCOUNTER — Encounter (HOSPITAL_COMMUNITY): Payer: Self-pay

## 2013-08-29 ENCOUNTER — Encounter (HOSPITAL_COMMUNITY): Payer: No Typology Code available for payment source

## 2013-08-29 ENCOUNTER — Encounter (HOSPITAL_COMMUNITY)
Admission: RE | Admit: 2013-08-29 | Discharge: 2013-08-29 | Disposition: A | Discharge status: Home / Self Care | Payer: No Typology Code available for payment source | Source: Ambulatory Visit | Attending: Interventional Radiology | Admitting: Interventional Radiology

## 2013-08-29 ENCOUNTER — Ambulatory Visit (HOSPITAL_COMMUNITY)
Admission: RE | Admit: 2013-08-29 | Discharge: 2013-08-29 | Disposition: A | Discharge status: Home / Self Care | Payer: No Typology Code available for payment source | Source: Ambulatory Visit | Attending: Interventional Radiology | Admitting: Interventional Radiology

## 2013-08-29 ENCOUNTER — Other Ambulatory Visit (HOSPITAL_COMMUNITY): Payer: Self-pay | Admitting: Interventional Radiology

## 2013-08-29 DIAGNOSIS — F17200 Nicotine dependence, unspecified, uncomplicated: Secondary | ICD-10-CM | POA: Insufficient documentation

## 2013-08-29 DIAGNOSIS — Z8619 Personal history of other infectious and parasitic diseases: Secondary | ICD-10-CM | POA: Insufficient documentation

## 2013-08-29 DIAGNOSIS — C228 Malignant neoplasm of liver, primary, unspecified as to type: Secondary | ICD-10-CM | POA: Diagnosis not present

## 2013-08-29 DIAGNOSIS — I10 Essential (primary) hypertension: Secondary | ICD-10-CM | POA: Insufficient documentation

## 2013-08-29 DIAGNOSIS — E119 Type 2 diabetes mellitus without complications: Secondary | ICD-10-CM | POA: Insufficient documentation

## 2013-08-29 LAB — COMPREHENSIVE METABOLIC PANEL
ALBUMIN: 3.9 g/dL (ref 3.5–5.2)
ALT: 72 U/L — ABNORMAL HIGH (ref 0–35)
ANION GAP: 13 (ref 5–15)
AST: 78 U/L — ABNORMAL HIGH (ref 0–37)
Alkaline Phosphatase: 150 U/L — ABNORMAL HIGH (ref 39–117)
BILIRUBIN TOTAL: 0.7 mg/dL (ref 0.3–1.2)
BUN: 9 mg/dL (ref 6–23)
CHLORIDE: 104 meq/L (ref 96–112)
CO2: 26 mEq/L (ref 19–32)
Calcium: 9.3 mg/dL (ref 8.4–10.5)
Creatinine, Ser: 0.66 mg/dL (ref 0.50–1.10)
GFR calc non Af Amer: >90 mL/min (ref >90)
GLUCOSE: 103 mg/dL — AB (ref 70–99)
Potassium: 3.8 mEq/L (ref 3.7–5.3)
Sodium: 143 mEq/L (ref 137–147)
Total Protein: 7.9 g/dL (ref 6.0–8.3)

## 2013-08-29 LAB — CBC WITH DIFFERENTIAL/PLATELET
BASOS PCT: 0 % (ref 0–1)
Basophils Absolute: 0 10*3/uL (ref 0.0–0.1)
Eosinophils Absolute: 0.1 10*3/uL (ref 0.0–0.7)
Eosinophils Relative: 2 % (ref 0–5)
HEMATOCRIT: 32.6 % — AB (ref 36.0–46.0)
HEMOGLOBIN: 11 g/dL — AB (ref 12.0–15.0)
Lymphocytes Relative: 31 % (ref 12–46)
Lymphs Abs: 2.1 10*3/uL (ref 0.7–4.0)
MCH: 29.8 pg (ref 26.0–34.0)
MCHC: 33.7 g/dL (ref 30.0–36.0)
MCV: 88.3 fL (ref 78.0–100.0)
MONO ABS: 0.6 10*3/uL (ref 0.1–1.0)
MONOS PCT: 9 % (ref 3–12)
Neutro Abs: 4 10*3/uL (ref 1.7–7.7)
Neutrophils Relative %: 58 % (ref 43–77)
Platelets: 230 10*3/uL (ref 150–400)
RBC: 3.69 MIL/uL — ABNORMAL LOW (ref 3.87–5.11)
RDW: 13.4 % (ref 11.5–15.5)
WBC: 6.8 10*3/uL (ref 4.0–10.5)

## 2013-08-29 LAB — GLUCOSE, CAPILLARY: GLUCOSE-CAPILLARY: 96 mg/dL (ref 70–99)

## 2013-08-29 MED ORDER — HYDROCODONE-ACETAMINOPHEN 5-325 MG PO TABS: 1.0000 | ORAL_TABLET | ORAL | Status: DC | PRN

## 2013-08-29 MED ORDER — IOHEXOL 300 MG/ML  SOLN
300.0000 mL | Freq: Once | INTRAMUSCULAR | Status: AC | PRN
  Administered 2013-08-29: 83 mL via INTRAVENOUS

## 2013-08-29 MED ORDER — LIDOCAINE HCL 1 % IJ SOLN
INTRAMUSCULAR | Status: AC
  Filled 2013-08-29: qty 20

## 2013-08-29 MED ORDER — FENTANYL CITRATE 0.05 MG/ML IJ SOLN
INTRAMUSCULAR | Status: AC
  Filled 2013-08-29: qty 6

## 2013-08-29 MED ORDER — FENTANYL CITRATE 0.05 MG/ML IJ SOLN
INTRAMUSCULAR | Status: AC | PRN
  Administered 2013-08-29 (×2): 50 ug via INTRAVENOUS

## 2013-08-29 MED ORDER — SODIUM CHLORIDE 0.9 % IV SOLN
INTRAVENOUS | Status: DC
  Administered 2013-08-29: 08:00:00 via INTRAVENOUS

## 2013-08-29 MED ORDER — TECHNETIUM TO 99M ALBUMIN AGGREGATED: 5.0000 | Freq: Once | INTRAVENOUS | Status: AC | PRN

## 2013-08-29 MED ORDER — IOHEXOL 300 MG/ML  SOLN
INTRAMUSCULAR | Status: DC | PRN
  Administered 2013-08-29: 1 mL via INTRAVENOUS

## 2013-08-29 MED ORDER — MIDAZOLAM HCL 2 MG/2ML IJ SOLN
INTRAMUSCULAR | Status: AC
  Filled 2013-08-29: qty 6

## 2013-08-29 MED ORDER — MIDAZOLAM HCL 2 MG/2ML IJ SOLN
INTRAMUSCULAR | Status: AC | PRN
  Administered 2013-08-29 (×6): 1 mg via INTRAVENOUS

## 2013-08-29 NOTE — Sedation Documentation (Signed)
Pt transport back to Short Stay in bed with RN.

## 2013-08-29 NOTE — H&P (Signed)
Whitney Patel is an 58 y.o. female.   Chief Complaint: "I'm having a treatment on my liver" HPI: Patient with history of hepatitis C, multifocal HCC and imaging suggesting early cirrhosis presents today following IR consultation for pre Y90 mapping mesenteric/hepatic arteriography/embolization and test dosing.  Past Medical History  Diagnosis Date  . Diabetes mellitus   . Hypertension   . Hepatocellular carcinoma     Past Surgical History  Procedure Laterality Date  . Cesarean section      History reviewed. No pertinent family history. Social History:  reports that she has been smoking Cigarettes.  She started smoking about 41 years ago. She has a 7.5 pack-year smoking history. She does not have any smokeless tobacco history on file. She reports that she drinks alcohol. She reports that she does not use illicit drugs.  Allergies: No Known Allergies  Current outpatient prescriptions:amLODipine (NORVASC) 10 MG tablet, Take 10 mg by mouth every morning., Disp: , Rfl: ;  carvedilol (COREG) 12.5 MG tablet, Take 1 tablet (12.5 mg total) by mouth 2 (two) times daily with a meal., Disp: 60 tablet, Rfl: 1;  glucose blood test strip, Please dispense glucose test strips for Accu Chek Aviva  to check Blood sugars two to three times daily . Also dispense lancets, Disp: 100 each, Rfl: 10 hydrochlorothiazide (MICROZIDE) 12.5 MG capsule, Take 12.5 mg by mouth 2 (two) times daily., Disp: , Rfl: ;  lisinopril (PRINIVIL,ZESTRIL) 20 MG tablet, Take 1 tablet (20 mg total) by mouth 2 (two) times daily., Disp: 60 tablet, Rfl: 1;  metFORMIN (GLUCOPHAGE) 500 MG tablet, Take 1,000 mg by mouth 2 (two) times daily with a meal. , Disp: , Rfl:  Current facility-administered medications:0.9 %  sodium chloride infusion, , Intravenous, Continuous, D Rowe Robert, PA-C, Last Rate: 75 mL/hr at 08/29/13 0748    Results for orders placed during the hospital encounter of 08/29/13  CBC WITH DIFFERENTIAL      Result Value Ref  Range   WBC 6.8  4.0 - 10.5 K/uL   RBC 3.69 (*) 3.87 - 5.11 MIL/uL   Hemoglobin 11.0 (*) 12.0 - 15.0 g/dL   HCT 32.6 (*) 36.0 - 46.0 %   MCV 88.3  78.0 - 100.0 fL   MCH 29.8  26.0 - 34.0 pg   MCHC 33.7  30.0 - 36.0 g/dL   RDW 13.4  11.5 - 15.5 %   Platelets 230  150 - 400 K/uL   Neutrophils Relative % 58  43 - 77 %   Neutro Abs 4.0  1.7 - 7.7 K/uL   Lymphocytes Relative 31  12 - 46 %   Lymphs Abs 2.1  0.7 - 4.0 K/uL   Monocytes Relative 9  3 - 12 %   Monocytes Absolute 0.6  0.1 - 1.0 K/uL   Eosinophils Relative 2  0 - 5 %   Eosinophils Absolute 0.1  0.0 - 0.7 K/uL   Basophils Relative 0  0 - 1 %   Basophils Absolute 0.0  0.0 - 0.1 K/uL  COMPREHENSIVE METABOLIC PANEL      Result Value Ref Range   Sodium 143  137 - 147 mEq/L   Potassium 3.8  3.7 - 5.3 mEq/L   Chloride 104  96 - 112 mEq/L   CO2 26  19 - 32 mEq/L   Glucose, Bld 103 (*) 70 - 99 mg/dL   BUN 9  6 - 23 mg/dL   Creatinine, Ser 0.66  0.50 - 1.10 mg/dL  Calcium 9.3  8.4 - 10.5 mg/dL   Total Protein 7.9  6.0 - 8.3 g/dL   Albumin 3.9  3.5 - 5.2 g/dL   AST 78 (*) 0 - 37 U/L   ALT 72 (*) 0 - 35 U/L   Alkaline Phosphatase 150 (*) 39 - 117 U/L   Total Bilirubin 0.7  0.3 - 1.2 mg/dL   GFR calc non Af Amer >90  >90 mL/min   GFR calc Af Amer >90  >90 mL/min   Anion gap 13  5 - 15    Review of Systems  Constitutional: Negative for fever and chills.  Respiratory: Negative for cough, hemoptysis and shortness of breath.   Cardiovascular: Negative for chest pain.  Gastrointestinal: Positive for abdominal pain. Negative for nausea, vomiting and blood in stool.  Genitourinary: Negative for dysuria and hematuria.  Musculoskeletal: Positive for back pain.  Neurological: Negative for headaches.  Endo/Heme/Allergies: Does not bruise/bleed easily.  Psychiatric/Behavioral: The patient is nervous/anxious.     Blood pressure 166/94, pulse 71, temperature 98.6 F (37 C), temperature source Oral, resp. rate 16, SpO2  97.00%. Physical Exam  Constitutional: She is oriented to person, place, and time. She appears well-developed and well-nourished.  Cardiovascular: Normal rate and regular rhythm.   Murmur heard. Respiratory: Effort normal and breath sounds normal.  GI: Soft. Bowel sounds are normal. There is tenderness.  Musculoskeletal: Normal range of motion. She exhibits no edema.  Neurological: She is alert and oriented to person, place, and time.     Assessment/Plan Patient with history of hepatitis C, multifocal HCC and imaging suggesting early cirrhosis presents today following IR consultation for pre Y90 mapping mesenteric/hepatic arteriography/embolization and test dosing. Details/risks of procedure d/w pt/husband with their understanding and consent.  ALLRED,D KEVIN 08/29/2013, 8:38 AM

## 2013-08-29 NOTE — Sedation Documentation (Signed)
5Fr sheath removed from R fem art by Dr. Vernard Gambles.  Hemostasis achieved with Exoseal.  R groin level 0, 2+RDP.  Manual pressure applied X 2 min. Gauze/tegaderm bandage applied.

## 2013-08-29 NOTE — Discharge Instructions (Signed)
Arteriogram Care After These instructions give you information on caring for yourself after your procedure. Your doctor may also give you more specific instructions. Call your doctor if you have any problems or questions after your procedure. HOME CARE  Keep your leg straight for at least 6 hours.  Do not bathe, swim, or use a hot tub until directed by your doctor. You can shower.  Do not lift anything heavier than 10 pounds (about a gallon of milk) for 2 days.  Do not walk a lot, run, or drive for 2 days.  Return to normal activities in 2 days or as told by your doctor. Finding out the results of your test Ask when your test results will be ready. Make sure you get your test results. GET HELP RIGHT AWAY IF:   You have fever.  You have more pain in your leg.  The leg that was cut is:  Bleeding.  Puffy (swollen) or red.  Cold.  Pale or changes color.  Weak.  Tingly or numb. If you go to the Emergency Room, tell your nurse that you have had an arteriogram. Take this paper with you to show the nurse. MAKE SURE YOU:  Understand these instructions.  Will watch your condition.  Will get help right away if you are not doing well or get worse. Document Released: 04/07/2008 Document Revised: 01/14/2013 Document Reviewed: 04/07/2008 [facility] Patient Information 2015 [place], [place]. This information is not intended to replace advice given to you by your health care provider. Make sure you discuss any questions you have with your health care provider. Conscious Sedation Sedation is the use of medicines to promote relaxation and relieve discomfort and anxiety. Conscious sedation is a type of sedation. Under conscious sedation you are less alert than normal but are still able to respond to instructions or stimulation. Conscious sedation is used during short medical and dental procedures. It is milder than deep sedation or general anesthesia and allows you to return to your regular  activities sooner.  LET [facility] CARE PROVIDER KNOW ABOUT:   Any allergies you have.  All medicines you are taking, including vitamins, herbs, eye drops, creams, and over-the-counter medicines.  Use of steroids (by mouth or creams).  Previous problems you or members of your family have had with the use of anesthetics.  Any blood disorders you have.  Previous surgeries you have had.  Medical conditions you have.  Possibility of pregnancy, if this applies.  Use of cigarettes, alcohol, or illegal drugs. RISKS AND COMPLICATIONS Generally, this is a safe procedure. However, as with any procedure, problems can occur. Possible problems include:  Oversedation.  Trouble breathing on your own. You may need to have a breathing tube until you are awake and breathing on your own.  Allergic reaction to any of the medicines used for the procedure. BEFORE THE PROCEDURE  You may have blood tests done. These tests can help show how well your kidneys and liver are working. They can also show how well your blood clots.  A physical exam will be done.  Only take medicines as directed by your health care provider. You may need to stop taking medicines (such as blood thinners, aspirin, or nonsteroidal anti-inflammatory drugs) before the procedure.   Do not eat or drink at least 6 hours before the procedure or as directed by your health care provider.  Arrange for a responsible adult, family member, or friend to take you home after the procedure. He or she should stay  with you for at least 24 hours after the procedure, until the medicine has worn off. PROCEDURE   An intravenous (IV) catheter will be inserted into one of your veins. Medicine will be able to flow directly into your body through this catheter. You may be given medicine through this tube to help prevent pain and help you relax.  The medical or dental procedure will be done. AFTER THE PROCEDURE  You will stay in a recovery area  until the medicine has worn off. Your blood pressure and pulse will be checked.   Depending on the procedure you had, you may be allowed to go home when you can tolerate liquids and your pain is under control. Document Released: 10/04/2000 Document Revised: 01/14/2013 Document Reviewed: 09/16/2012 [facility] Patient Information 2015 [place], [place]. This information is not intended to replace advice given to you by your health care provider. Make sure you discuss any questions you have with your health care provider.

## 2013-08-29 NOTE — Procedures (Signed)
Hepatic arteriogram and MAA injection No complication No blood loss. See complete dictation in [employer]

## 2013-09-02 ENCOUNTER — Other Ambulatory Visit (HOSPITAL_COMMUNITY): Payer: Self-pay | Admitting: Interventional Radiology

## 2013-09-02 DIAGNOSIS — C228 Malignant neoplasm of liver, primary, unspecified as to type: Secondary | ICD-10-CM

## 2013-09-03 ENCOUNTER — Ambulatory Visit
Admission: RE | Admit: 2013-09-03 | Discharge: 2013-09-03 | Disposition: A | Discharge status: Home / Self Care | Payer: No Typology Code available for payment source | Source: Ambulatory Visit | Attending: Interventional Radiology | Admitting: Interventional Radiology

## 2013-09-03 DIAGNOSIS — C228 Malignant neoplasm of liver, primary, unspecified as to type: Secondary | ICD-10-CM

## 2013-09-04 ENCOUNTER — Other Ambulatory Visit (HOSPITAL_COMMUNITY): Payer: Self-pay | Admitting: Interventional Radiology

## 2013-09-04 DIAGNOSIS — C228 Malignant neoplasm of liver, primary, unspecified as to type: Secondary | ICD-10-CM

## 2013-09-11 ENCOUNTER — Encounter (HOSPITAL_COMMUNITY): Payer: No Typology Code available for payment source

## 2013-09-11 ENCOUNTER — Other Ambulatory Visit (HOSPITAL_COMMUNITY): Payer: No Typology Code available for payment source

## 2013-09-11 ENCOUNTER — Ambulatory Visit (HOSPITAL_COMMUNITY): Payer: No Typology Code available for payment source

## 2013-09-15 ENCOUNTER — Other Ambulatory Visit: Payer: Self-pay | Admitting: Radiology

## 2013-09-15 ENCOUNTER — Encounter (HOSPITAL_COMMUNITY): Payer: Self-pay | Admitting: Pharmacist

## 2013-09-15 ENCOUNTER — Other Ambulatory Visit: Payer: Self-pay | Admitting: *Deleted

## 2013-09-15 MED ORDER — DOXORUBICIN HCL 50 MG IV SOLR
150.0000 mg | Freq: Once | INTRAVENOUS | Status: AC
  Administered 2013-09-16: 150 mg via INTRA_ARTERIAL
  Filled 2013-09-15: qty 150

## 2013-09-16 ENCOUNTER — Other Ambulatory Visit (HOSPITAL_COMMUNITY): Payer: Self-pay | Admitting: Interventional Radiology

## 2013-09-16 ENCOUNTER — Inpatient Hospital Stay (HOSPITAL_COMMUNITY): Admission: RE | Admit: 2013-09-16 | Payer: No Typology Code available for payment source | Source: Ambulatory Visit

## 2013-09-16 ENCOUNTER — Ambulatory Visit (HOSPITAL_COMMUNITY): Admission: RE | Admit: 2013-09-16 | Payer: No Typology Code available for payment source | Source: Ambulatory Visit

## 2013-09-16 ENCOUNTER — Observation Stay (HOSPITAL_COMMUNITY)
Admission: RE | Admit: 2013-09-16 | Discharge: 2013-09-17 | Disposition: A | Discharge status: Home / Self Care | Payer: No Typology Code available for payment source | Source: Ambulatory Visit | Attending: Interventional Radiology | Admitting: Interventional Radiology

## 2013-09-16 VITALS — BP 151/63 | HR 62 | Temp 98.7°F | Resp 16 | Ht 65.0 in | Wt 133.0 lb

## 2013-09-16 DIAGNOSIS — C229 Malignant neoplasm of liver, not specified as primary or secondary: Secondary | ICD-10-CM | POA: Diagnosis present

## 2013-09-16 DIAGNOSIS — F1920 Other psychoactive substance dependence, uncomplicated: Secondary | ICD-10-CM | POA: Insufficient documentation

## 2013-09-16 DIAGNOSIS — I10 Essential (primary) hypertension: Secondary | ICD-10-CM | POA: Diagnosis not present

## 2013-09-16 DIAGNOSIS — K769 Liver disease, unspecified: Secondary | ICD-10-CM

## 2013-09-16 DIAGNOSIS — T148XXA Other injury of unspecified body region, initial encounter: Secondary | ICD-10-CM

## 2013-09-16 DIAGNOSIS — B182 Chronic viral hepatitis C: Secondary | ICD-10-CM

## 2013-09-16 DIAGNOSIS — K7460 Unspecified cirrhosis of liver: Secondary | ICD-10-CM | POA: Insufficient documentation

## 2013-09-16 DIAGNOSIS — Z720 Tobacco use: Secondary | ICD-10-CM

## 2013-09-16 DIAGNOSIS — C220 Liver cell carcinoma: Secondary | ICD-10-CM | POA: Diagnosis present

## 2013-09-16 DIAGNOSIS — B1920 Unspecified viral hepatitis C without hepatic coma: Secondary | ICD-10-CM | POA: Diagnosis not present

## 2013-09-16 DIAGNOSIS — F17200 Nicotine dependence, unspecified, uncomplicated: Secondary | ICD-10-CM | POA: Insufficient documentation

## 2013-09-16 DIAGNOSIS — F1911 Other psychoactive substance abuse, in remission: Secondary | ICD-10-CM

## 2013-09-16 DIAGNOSIS — E119 Type 2 diabetes mellitus without complications: Secondary | ICD-10-CM | POA: Insufficient documentation

## 2013-09-16 DIAGNOSIS — C228 Malignant neoplasm of liver, primary, unspecified as to type: Secondary | ICD-10-CM

## 2013-09-16 DIAGNOSIS — E138 Other specified diabetes mellitus with unspecified complications: Secondary | ICD-10-CM

## 2013-09-16 DIAGNOSIS — K7030 Alcoholic cirrhosis of liver without ascites: Secondary | ICD-10-CM

## 2013-09-16 LAB — COMPREHENSIVE METABOLIC PANEL
ALT: 78 U/L — ABNORMAL HIGH (ref 0–35)
ANION GAP: 12 (ref 5–15)
AST: 103 U/L — ABNORMAL HIGH (ref 0–37)
Albumin: 3.7 g/dL (ref 3.5–5.2)
Alkaline Phosphatase: 181 U/L — ABNORMAL HIGH (ref 39–117)
BUN: 7 mg/dL (ref 6–23)
CALCIUM: 9.1 mg/dL (ref 8.4–10.5)
CO2: 28 mEq/L (ref 19–32)
Chloride: 103 mEq/L (ref 96–112)
Creatinine, Ser: 0.5 mg/dL (ref 0.50–1.10)
GFR calc non Af Amer: >90 mL/min (ref >90)
GLUCOSE: 116 mg/dL — AB (ref 70–99)
Potassium: 3.8 mEq/L (ref 3.7–5.3)
SODIUM: 143 meq/L (ref 137–147)
Total Bilirubin: 0.5 mg/dL (ref 0.3–1.2)
Total Protein: 7.8 g/dL (ref 6.0–8.3)

## 2013-09-16 LAB — CBC WITH DIFFERENTIAL/PLATELET
Basophils Absolute: 0 10*3/uL (ref 0.0–0.1)
Basophils Relative: 0 % (ref 0–1)
EOS ABS: 0.1 10*3/uL (ref 0.0–0.7)
Eosinophils Relative: 1 % (ref 0–5)
HCT: 35.7 % — ABNORMAL LOW (ref 36.0–46.0)
Hemoglobin: 12 g/dL (ref 12.0–15.0)
LYMPHS ABS: 2.2 10*3/uL (ref 0.7–4.0)
Lymphocytes Relative: 34 % (ref 12–46)
MCH: 30.5 pg (ref 26.0–34.0)
MCHC: 33.6 g/dL (ref 30.0–36.0)
MCV: 90.6 fL (ref 78.0–100.0)
MONOS PCT: 7 % (ref 3–12)
Monocytes Absolute: 0.4 10*3/uL (ref 0.1–1.0)
Neutro Abs: 3.6 10*3/uL (ref 1.7–7.7)
Neutrophils Relative %: 58 % (ref 43–77)
PLATELETS: 208 10*3/uL (ref 150–400)
RBC: 3.94 MIL/uL (ref 3.87–5.11)
RDW: 13.7 % (ref 11.5–15.5)
WBC: 6.3 10*3/uL (ref 4.0–10.5)

## 2013-09-16 LAB — APTT: aPTT: 30 seconds (ref 24–37)

## 2013-09-16 LAB — PROTIME-INR
INR: 1.06 (ref 0.00–1.49)
Prothrombin Time: 13.8 seconds (ref 11.6–15.2)

## 2013-09-16 LAB — GLUCOSE, CAPILLARY: GLUCOSE-CAPILLARY: 112 mg/dL — AB (ref 70–99)

## 2013-09-16 MED ORDER — CARVEDILOL 12.5 MG PO TABS
12.5000 mg | ORAL_TABLET | Freq: Two times a day (BID) | ORAL | Status: DC
  Administered 2013-09-16 – 2013-09-17 (×3): 12.5 mg via ORAL
  Filled 2013-09-16 (×5): qty 1

## 2013-09-16 MED ORDER — LISINOPRIL 20 MG PO TABS: 20.0000 mg | ORAL_TABLET | Freq: Two times a day (BID) | ORAL | Status: DC

## 2013-09-16 MED ORDER — MIDAZOLAM HCL 2 MG/2ML IJ SOLN
INTRAMUSCULAR | Status: AC | PRN
  Administered 2013-09-16 (×5): 1 mg via INTRAVENOUS

## 2013-09-16 MED ORDER — MIDAZOLAM HCL 2 MG/2ML IJ SOLN
INTRAMUSCULAR | Status: AC
Start: 2013-09-16 — End: 2013-09-16
  Filled 2013-09-16: qty 8

## 2013-09-16 MED ORDER — IOHEXOL 300 MG/ML  SOLN
150.0000 mL | Freq: Once | INTRAMUSCULAR | Status: AC | PRN
  Administered 2013-09-16: 1 mL via INTRAVENOUS

## 2013-09-16 MED ORDER — SODIUM CHLORIDE 0.9 % IV SOLN: 250.0000 mL | INTRAVENOUS | Status: DC | PRN

## 2013-09-16 MED ORDER — FENTANYL CITRATE 0.05 MG/ML IJ SOLN
INTRAMUSCULAR | Status: AC | PRN
  Administered 2013-09-16 (×5): 50 ug via INTRAVENOUS

## 2013-09-16 MED ORDER — SODIUM CHLORIDE 0.9 % IV SOLN
Freq: Once | INTRAVENOUS | Status: AC
  Administered 2013-09-16: 08:00:00 via INTRAVENOUS

## 2013-09-16 MED ORDER — SODIUM CHLORIDE 0.9 % IJ SOLN: 3.0000 mL | INTRAMUSCULAR | Status: DC | PRN

## 2013-09-16 MED ORDER — ONDANSETRON HCL 4 MG/2ML IJ SOLN
4.0000 mg | Freq: Once | INTRAMUSCULAR | Status: AC
  Administered 2013-09-16: 4 mg via INTRAVENOUS

## 2013-09-16 MED ORDER — LISINOPRIL 20 MG PO TABS
20.0000 mg | ORAL_TABLET | Freq: Two times a day (BID) | ORAL | Status: DC
  Administered 2013-09-16 – 2013-09-17 (×3): 20 mg via ORAL
  Filled 2013-09-16 (×4): qty 1

## 2013-09-16 MED ORDER — PROMETHAZINE HCL 25 MG PO TABS
25.0000 mg | ORAL_TABLET | Freq: Three times a day (TID) | ORAL | Status: DC | PRN
  Administered 2013-09-17: 25 mg via ORAL
  Filled 2013-09-16: qty 1

## 2013-09-16 MED ORDER — DOCUSATE SODIUM 100 MG PO CAPS
100.0000 mg | ORAL_CAPSULE | Freq: Two times a day (BID) | ORAL | Status: DC
  Administered 2013-09-16 – 2013-09-17 (×2): 100 mg via ORAL
  Filled 2013-09-16 (×4): qty 1

## 2013-09-16 MED ORDER — FENTANYL CITRATE 0.05 MG/ML IJ SOLN
INTRAMUSCULAR | Status: AC
  Filled 2013-09-16: qty 8

## 2013-09-16 MED ORDER — PROMETHAZINE HCL 25 MG RE SUPP
25.0000 mg | Freq: Three times a day (TID) | RECTAL | Status: DC | PRN
  Filled 2013-09-16: qty 1

## 2013-09-16 MED ORDER — HYDROCODONE-ACETAMINOPHEN 5-325 MG PO TABS
1.0000 | ORAL_TABLET | ORAL | Status: DC | PRN
  Administered 2013-09-16: 2 via ORAL
  Filled 2013-09-16: qty 2

## 2013-09-16 MED ORDER — AMLODIPINE BESYLATE 10 MG PO TABS
10.0000 mg | ORAL_TABLET | Freq: Every day | ORAL | Status: DC
  Administered 2013-09-16 – 2013-09-17 (×2): 10 mg via ORAL
  Filled 2013-09-16 (×2): qty 1

## 2013-09-16 MED ORDER — ONDANSETRON HCL 4 MG/2ML IJ SOLN
INTRAMUSCULAR | Status: AC
  Administered 2013-09-16: 4 mg via INTRAVENOUS
  Filled 2013-09-16: qty 2

## 2013-09-16 MED ORDER — HYDROCHLOROTHIAZIDE 12.5 MG PO CAPS
12.5000 mg | ORAL_CAPSULE | Freq: Two times a day (BID) | ORAL | Status: DC
  Administered 2013-09-16 – 2013-09-17 (×3): 12.5 mg via ORAL
  Filled 2013-09-16 (×5): qty 1

## 2013-09-16 MED ORDER — PIPERACILLIN-TAZOBACTAM 3.375 G IVPB
3.3750 g | Freq: Once | INTRAVENOUS | Status: AC
  Administered 2013-09-16: 3.375 g via INTRAVENOUS
  Filled 2013-09-16: qty 50

## 2013-09-16 MED ORDER — IOHEXOL 300 MG/ML  SOLN
100.0000 mL | Freq: Once | INTRAMUSCULAR | Status: AC | PRN
  Administered 2013-09-16: 100 mL via INTRAVENOUS

## 2013-09-16 MED ORDER — ONDANSETRON HCL 4 MG/2ML IJ SOLN
4.0000 mg | Freq: Four times a day (QID) | INTRAMUSCULAR | Status: DC | PRN
Start: 2013-09-16 — End: 2013-09-17
  Administered 2013-09-16 – 2013-09-17 (×2): 4 mg via INTRAVENOUS
  Filled 2013-09-16 (×2): qty 2

## 2013-09-16 MED ORDER — KETOROLAC TROMETHAMINE 30 MG/ML IJ SOLN
30.0000 mg | Freq: Four times a day (QID) | INTRAMUSCULAR | Status: DC
  Administered 2013-09-16 – 2013-09-17 (×4): 30 mg via INTRAVENOUS
  Filled 2013-09-16 (×7): qty 1

## 2013-09-16 MED ORDER — DEXAMETHASONE SODIUM PHOSPHATE 10 MG/ML IJ SOLN
8.0000 mg | Freq: Once | INTRAMUSCULAR | Status: AC
  Administered 2013-09-16: 8 mg via INTRAVENOUS
  Filled 2013-09-16: qty 1

## 2013-09-16 MED ORDER — SODIUM CHLORIDE 0.9 % IJ SOLN
3.0000 mL | Freq: Two times a day (BID) | INTRAMUSCULAR | Status: DC
  Administered 2013-09-16 – 2013-09-17 (×3): 3 mL via INTRAVENOUS

## 2013-09-16 MED ORDER — LIDOCAINE HCL 1 % IJ SOLN
INTRAMUSCULAR | Status: AC
  Filled 2013-09-16: qty 20

## 2013-09-16 MED FILL — Medication: Qty: 1 | Status: AC

## 2013-09-16 NOTE — Progress Notes (Signed)
Report called to [employer], Agricultural consultant, on 6E.

## 2013-09-16 NOTE — Progress Notes (Signed)
Admission note:  Arrival Method: bed Mental Orientation: alert & oriented x 4 Telemetry: not ordered  Assessment: completed  Skin: dressing to right groin covering surgical site; dressing clean, dry, and intact IV: right AC; saline locked  Pain: pt denies pain  Tubes: N/A Safety Measures: Patient Handbook has been given, and discussed the Fall Prevention worksheet. Left at bedside  Admission: Completed and admission orders have been written  6E Orientation: Patient has been oriented to the unit, staff and to the room.  Family: At the bedside; daughter

## 2013-09-16 NOTE — Procedures (Signed)
Successful Rt hepatic artery bland embo and DEB TACE No immed comp Stable Observe overnight Full report in PACS

## 2013-09-16 NOTE — Telephone Encounter (Signed)
Refill for lisinopril placed. Please asked patient to schedule an appointment to meet PCP whenever patient has availability.  Thank you.

## 2013-09-16 NOTE — Progress Notes (Signed)
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  Objective: Vital signs in last 24 hours: Temp:  [98.5 F (36.9 C)] 98.5 F (36.9 C) (08/25 0635) Pulse Rate:  [58-76] 70 (08/25 1200) Resp:  [9-19] 16 (08/25 1200) BP: (123-175)/(62-99) 164/86 mmHg (08/25 1200) SpO2:  [92 %-98 %] 98 % (08/25 1200) Weight:  [133 lb (60.328 kg)] 133 lb (60.328 kg) (08/25 0635)    Intake/Output from previous day:   Intake/Output this shift: Total I/O In: -  Out: 1400 [Urine:1400]  abd soft, mild-mod RUQ/epigastric tenderness,+BS; rt groin puncture site clean and dry,NT, no hematoma; intact distal pulses  Lab Results:   Recent Labs  09/16/13 0721  WBC 6.3  HGB 12.0  HCT 35.7*  PLT 208   BMET  Recent Labs  09/16/13 0721  NA 143  K 3.8  CL 103  CO2 28  GLUCOSE 116*  BUN 7  CREATININE 0.50  CALCIUM 9.1   PT/INR  Recent Labs  09/16/13 0721  LABPROT 13.8  INR 1.06   ABG No results found for this basename: PHART, PCO2, PO2, HCO3,  in the last 72 hours  Studies/Results: Ir Angiogram Visceral Selective  09/16/2013   CLINICAL DATA:  Hepatocellular carcinoma, non operative candidate  EXAM: ULTRASOUND GUIDANCE FOR VASCULAR ACCESS  CELIAC ANGIOGRAM  PROPER HEPATIC ANGIOGRAM  RIGHT HEPATIC ANGIOGRAM  RIGHT HEPATIC BLAND EMBOLIZATION AND DRUG-ELUTING BEADS TRANSARTERIAL CHEMO EMBOLIZATION  Date:  8/25/20158/25/2015 11:03 am  Radiologist:  M. Daryll Brod, MD  Guidance:  Ultrasound and fluoroscopic  FLUOROSCOPY TIME:  12 min 18 seconds  MEDICATIONS AND MEDICAL HISTORY: 8 mg Decadron, 4 mg Zofran, 3.375 g Zosyn administered within 1 hr of the procedure, 5 mg Versed, 250 mcg fentanyl  ANESTHESIA/SEDATION: 1 hr 15 min  CONTRAST:  181mL OMNIPAQUE IOHEXOL 300 MG/ML  SOLN  COMPLICATIONS: No immediate   PROCEDURE: Informed consent was obtained from the patient following explanation of the procedure, risks, benefits and alternatives. The patient understands, agrees and consents for the procedure. All questions were addressed. A time out was performed.  Maximal barrier sterile technique utilized including caps, mask, sterile gowns, sterile gloves, large sterile drape, hand hygiene, and Betadine.  Under sterile conditions and local anesthesia, ultrasound micropuncture access was performed of the right common femoral artery. Five French sheath inserted over a guidewire. C2 catheter utilized to select the celiac origin over a Bentson guidewire. Selective celiac angiogram performed.  Celiac angiogram: Celiac artery is widely patent. Hepatic, gastroduodenal, and splenic vasculature are patent. Arterial portal venous shunting re- demonstrated through the diffuse right hepatic lobe tumor vascularity.  Catheter was advanced into the proper hepatic artery over a Glidewire. Selective proper hepatic angiogram performed.  Proper hepatic angiogram: This demonstrates patency of the right and left hepatic arteries. Arterial portal venous shunting is demonstrated through both the left and and right hepatic arteries. There is retrograde flow demonstrated in the left portal vein. Main portal vein is visualized with retrograde flow indicative of the portal hypertension. Diffuse tumor vascularity present predominately throughout the right hepatic lobe.  Renegade high flow micro catheter was advanced into the right hepatic artery. Selective right hepatic angiogram performed.  Right hepatic angiogram: This demonstrates patency of the right hepatic artery. Peripheral tumor vascularity noted with arterial portal venous shunting.  Bland embolization: From dthi slocation,  right hepatic artery bland embolization was performed with 1.5 vials of 700 - 900 micron embospheres. This is to decrease the degree of arterial portal venous shunting prior  to administering drug-eluting beads.  Following bland embolization, there is no longer significant arterial portal venous shunting demonstrated.  Drug-eluting beads -trans arterial chemo embolization: Again from the same peripheral right hepatic artery micro catheter, 112.5 mg doxorubicin drug-eluting beads/ trans arterial chemo embolization was performed.  A post embolization right hepatic angiogram demonstrates preserved antegrade flow throughout the hepatic vasculature but the arterial flow is significantly decreased and sluggish following the embolization procedure.  Five French catheter and micro catheter removed. Right common femoral artery site was closed with a 5 [place] StarClose device. No immediate complication. Patient tolerated the procedure well. Patient will be admitted for overnight observation.  IMPRESSION: Successful right hepatic artery bland embolization with 700 - 900 micronEmbospheres to decrease the arterial portal venous shunting through the tumor vascularity.  This was followed by, successful drug-eluting beads trans arterial chemo embolization (total dose administered 112.5 mg doxorubicin).   Electronically Signed   By: Daryll Brod M.D.   On: 09/16/2013 11:30   Ir Angiogram Selective Each Additional Vessel  09/16/2013   CLINICAL DATA:  Hepatocellular carcinoma, non operative candidate  EXAM: ULTRASOUND GUIDANCE FOR VASCULAR ACCESS  CELIAC ANGIOGRAM  PROPER HEPATIC ANGIOGRAM  RIGHT HEPATIC ANGIOGRAM  RIGHT HEPATIC BLAND EMBOLIZATION AND DRUG-ELUTING BEADS TRANSARTERIAL CHEMO EMBOLIZATION  Date:  8/25/20158/25/2015 11:03 am  Radiologist:  M. Daryll Brod, MD  Guidance:  Ultrasound and fluoroscopic  FLUOROSCOPY TIME:  12 min 18 seconds  MEDICATIONS AND MEDICAL HISTORY: 8 mg Decadron, 4 mg Zofran, 3.375 g Zosyn administered within 1 hr of the procedure, 5 mg Versed, 250 mcg fentanyl  ANESTHESIA/SEDATION: 1 hr 15 min  CONTRAST:  156mL OMNIPAQUE IOHEXOL 300 MG/ML  SOLN  COMPLICATIONS: No  immediate  PROCEDURE: Informed consent was obtained from the patient following explanation of the procedure, risks, benefits and alternatives. The patient understands, agrees and consents for the procedure. All questions were addressed. A time out was performed.  Maximal barrier sterile technique utilized including caps, mask, sterile gowns, sterile gloves, large sterile drape, hand hygiene, and Betadine.  Under sterile conditions and local anesthesia, ultrasound micropuncture access was performed of the right common femoral artery. Five French sheath inserted over a guidewire. C2 catheter utilized to select the celiac origin over a Bentson guidewire. Selective celiac angiogram performed.  Celiac angiogram: Celiac artery is widely patent. Hepatic, gastroduodenal, and splenic vasculature are patent. Arterial portal venous shunting re- demonstrated through the diffuse right hepatic lobe tumor vascularity.  Catheter was advanced into the proper hepatic artery over a Glidewire. Selective proper hepatic angiogram performed.  Proper hepatic angiogram: This demonstrates patency of the right and left hepatic arteries. Arterial portal venous shunting is demonstrated through both the left and and right hepatic arteries. There is retrograde flow demonstrated in the left portal vein. Main portal vein is visualized with retrograde flow indicative of the portal hypertension. Diffuse tumor vascularity present predominately throughout the right hepatic lobe.  Renegade high flow micro catheter was advanced into the right hepatic artery. Selective right hepatic angiogram performed.  Right hepatic angiogram: This demonstrates patency of the right hepatic artery. Peripheral tumor vascularity noted with arterial portal venous shunting.  Bland embolization: From dthi slocation, right hepatic artery bland embolization was performed with 1.5 vials of 700 - 900 micron embospheres. This is to decrease the degree of arterial portal venous  shunting prior  to administering drug-eluting beads.  Following bland embolization, there is no longer significant arterial portal venous shunting demonstrated.  Drug-eluting beads -trans arterial chemo embolization: Again from the same peripheral right hepatic artery micro catheter, 112.5 mg doxorubicin drug-eluting beads/ trans arterial chemo embolization was performed.  A post embolization right hepatic angiogram demonstrates preserved antegrade flow throughout the hepatic vasculature but the arterial flow is significantly decreased and sluggish following the embolization procedure.  Five French catheter and micro catheter removed. Right common femoral artery site was closed with a 5 [place] StarClose device. No immediate complication. Patient tolerated the procedure well. Patient will be admitted for overnight observation.  IMPRESSION: Successful right hepatic artery bland embolization with 700 - 900 micronEmbospheres to decrease the arterial portal venous shunting through the tumor vascularity.  This was followed by, successful drug-eluting beads trans arterial chemo embolization (total dose administered 112.5 mg doxorubicin).   Electronically Signed   By: Daryll Brod M.D.   On: 09/16/2013 11:30   Ir Angiogram Selective Each Additional Vessel  09/16/2013   CLINICAL DATA:  Hepatocellular carcinoma, non operative candidate  EXAM: ULTRASOUND GUIDANCE FOR VASCULAR ACCESS  CELIAC ANGIOGRAM  PROPER HEPATIC ANGIOGRAM  RIGHT HEPATIC ANGIOGRAM  RIGHT HEPATIC BLAND EMBOLIZATION AND DRUG-ELUTING BEADS TRANSARTERIAL CHEMO EMBOLIZATION  Date:  8/25/20158/25/2015 11:03 am  Radiologist:  M. Daryll Brod, MD  Guidance:  Ultrasound and fluoroscopic  FLUOROSCOPY TIME:  12 min 18 seconds  MEDICATIONS AND MEDICAL HISTORY: 8 mg Decadron, 4 mg Zofran, 3.375 g Zosyn administered within 1 hr of the procedure, 5 mg Versed, 250 mcg fentanyl  ANESTHESIA/SEDATION: 1 hr 15 min  CONTRAST:  145mL OMNIPAQUE IOHEXOL 300 MG/ML  SOLN   COMPLICATIONS: No immediate  PROCEDURE: Informed consent was obtained from the patient following explanation of the procedure, risks, benefits and alternatives. The patient understands, agrees and consents for the procedure. All questions were addressed. A time out was performed.  Maximal barrier sterile technique utilized including caps, mask, sterile gowns, sterile gloves, large sterile drape, hand hygiene, and Betadine.  Under sterile conditions and local anesthesia, ultrasound micropuncture access was performed of the right common femoral artery. Five French sheath inserted over a guidewire. C2 catheter utilized to select the celiac origin over a Bentson guidewire. Selective celiac angiogram performed.  Celiac angiogram: Celiac artery is widely patent. Hepatic, gastroduodenal, and splenic vasculature are patent. Arterial portal venous shunting re- demonstrated through the diffuse right hepatic lobe tumor vascularity.  Catheter was advanced into the proper hepatic artery over a Glidewire. Selective proper hepatic angiogram performed.  Proper hepatic angiogram: This demonstrates patency of the right and left hepatic arteries. Arterial portal venous shunting is demonstrated through both the left and and right hepatic arteries. There is retrograde flow demonstrated in the left portal vein. Main portal vein is visualized with retrograde flow indicative of the portal hypertension. Diffuse tumor vascularity present predominately throughout the right hepatic lobe.  Renegade high flow micro catheter was advanced into the right hepatic artery. Selective right hepatic angiogram performed.  Right hepatic angiogram: This demonstrates patency of the right hepatic artery. Peripheral tumor vascularity noted with arterial portal venous shunting.  Bland embolization: From dthi slocation, right hepatic artery bland embolization was performed with 1.5 vials of 700 - 900 micron embospheres. This is to decrease the degree of arterial  portal venous shunting prior to administering drug-eluting beads.  Following bland embolization, there is no longer significant arterial portal venous shunting demonstrated.  Drug-eluting beads -trans arterial chemo embolization: Again from the same  peripheral right hepatic artery micro catheter, 112.5 mg doxorubicin drug-eluting beads/ trans arterial chemo embolization was performed.  A post embolization right hepatic angiogram demonstrates preserved antegrade flow throughout the hepatic vasculature but the arterial flow is significantly decreased and sluggish following the embolization procedure.  Five French catheter and micro catheter removed. Right common femoral artery site was closed with a 5 [place] StarClose device. No immediate complication. Patient tolerated the procedure well. Patient will be admitted for overnight observation.  IMPRESSION: Successful right hepatic artery bland embolization with 700 - 900 micronEmbospheres to decrease the arterial portal venous shunting through the tumor vascularity.  This was followed by, successful drug-eluting beads trans arterial chemo embolization (total dose administered 112.5 mg doxorubicin).   Electronically Signed   By: Daryll Brod M.D.   On: 09/16/2013 11:30   Ir US Guide Vasc Access Right  09/16/2013   CLINICAL DATA:  Hepatocellular carcinoma, non operative candidate  EXAM: ULTRASOUND GUIDANCE FOR VASCULAR ACCESS  CELIAC ANGIOGRAM  PROPER HEPATIC ANGIOGRAM  RIGHT HEPATIC ANGIOGRAM  RIGHT HEPATIC BLAND EMBOLIZATION AND DRUG-ELUTING BEADS TRANSARTERIAL CHEMO EMBOLIZATION  Date:  8/25/20158/25/2015 11:03 am  Radiologist:  M. Daryll Brod, MD  Guidance:  Ultrasound and fluoroscopic  FLUOROSCOPY TIME:  12 min 18 seconds  MEDICATIONS AND MEDICAL HISTORY: 8 mg Decadron, 4 mg Zofran, 3.375 g Zosyn administered within 1 hr of the procedure, 5 mg Versed, 250 mcg fentanyl  ANESTHESIA/SEDATION: 1 hr 15 min  CONTRAST:  163mL OMNIPAQUE IOHEXOL 300 MG/ML  SOLN   COMPLICATIONS: No immediate  PROCEDURE: Informed consent was obtained from the patient following explanation of the procedure, risks, benefits and alternatives. The patient understands, agrees and consents for the procedure. All questions were addressed. A time out was performed.  Maximal barrier sterile technique utilized including caps, mask, sterile gowns, sterile gloves, large sterile drape, hand hygiene, and Betadine.  Under sterile conditions and local anesthesia, ultrasound micropuncture access was performed of the right common femoral artery. Five French sheath inserted over a guidewire. C2 catheter utilized to select the celiac origin over a Bentson guidewire. Selective celiac angiogram performed.  Celiac angiogram: Celiac artery is widely patent. Hepatic, gastroduodenal, and splenic vasculature are patent. Arterial portal venous shunting re- demonstrated through the diffuse right hepatic lobe tumor vascularity.  Catheter was advanced into the proper hepatic artery over a Glidewire. Selective proper hepatic angiogram performed.  Proper hepatic angiogram: This demonstrates patency of the right and left hepatic arteries. Arterial portal venous shunting is demonstrated through both the left and and right hepatic arteries. There is retrograde flow demonstrated in the left portal vein. Main portal vein is visualized with retrograde flow indicative of the portal hypertension. Diffuse tumor vascularity present predominately throughout the right hepatic lobe.  Renegade high flow micro catheter was advanced into the right hepatic artery. Selective right hepatic angiogram performed.  Right hepatic angiogram: This demonstrates patency of the right hepatic artery. Peripheral tumor vascularity noted with arterial portal venous shunting.  Bland embolization: From dthi slocation, right hepatic artery bland embolization was performed with 1.5 vials of 700 - 900 micron embospheres. This is to decrease the degree of arterial  portal venous shunting prior to administering drug-eluting beads.  Following bland embolization, there is no longer significant arterial portal venous shunting demonstrated.  Drug-eluting beads -trans arterial chemo embolization: Again from the same peripheral right hepatic artery micro catheter, 112.5 mg doxorubicin drug-eluting beads/ trans arterial chemo embolization was performed.  A post embolization right hepatic angiogram demonstrates preserved antegrade flow throughout  the hepatic vasculature but the arterial flow is significantly decreased and sluggish following the embolization procedure.  Five French catheter and micro catheter removed. Right common femoral artery site was closed with a 5 [place] StarClose device. No immediate complication. Patient tolerated the procedure well. Patient will be admitted for overnight observation.  IMPRESSION: Successful right hepatic artery bland embolization with 700 - 900 micronEmbospheres to decrease the arterial portal venous shunting through the tumor vascularity.  This was followed by, successful drug-eluting beads trans arterial chemo embolization (total dose administered 112.5 mg doxorubicin).   Electronically Signed   By: Daryll Brod M.D.   On: 09/16/2013 11:30   Ir Embo Tumor Organ Ischemia Infarct Inc Guide Roadmapping  09/16/2013   CLINICAL DATA:  Hepatocellular carcinoma, non operative candidate  EXAM: ULTRASOUND GUIDANCE FOR VASCULAR ACCESS  CELIAC ANGIOGRAM  PROPER HEPATIC ANGIOGRAM  RIGHT HEPATIC ANGIOGRAM  RIGHT HEPATIC BLAND EMBOLIZATION AND DRUG-ELUTING BEADS TRANSARTERIAL CHEMO EMBOLIZATION  Date:  8/25/20158/25/2015 11:03 am  Radiologist:  M. Daryll Brod, MD  Guidance:  Ultrasound and fluoroscopic  FLUOROSCOPY TIME:  12 min 18 seconds  MEDICATIONS AND MEDICAL HISTORY: 8 mg Decadron, 4 mg Zofran, 3.375 g Zosyn administered within 1 hr of the procedure, 5 mg Versed, 250 mcg fentanyl  ANESTHESIA/SEDATION: 1 hr 15 min  CONTRAST:  150mL OMNIPAQUE  IOHEXOL 300 MG/ML  SOLN  COMPLICATIONS: No immediate  PROCEDURE: Informed consent was obtained from the patient following explanation of the procedure, risks, benefits and alternatives. The patient understands, agrees and consents for the procedure. All questions were addressed. A time out was performed.  Maximal barrier sterile technique utilized including caps, mask, sterile gowns, sterile gloves, large sterile drape, hand hygiene, and Betadine.  Under sterile conditions and local anesthesia, ultrasound micropuncture access was performed of the right common femoral artery. Five French sheath inserted over a guidewire. C2 catheter utilized to select the celiac origin over a Bentson guidewire. Selective celiac angiogram performed.  Celiac angiogram: Celiac artery is widely patent. Hepatic, gastroduodenal, and splenic vasculature are patent. Arterial portal venous shunting re- demonstrated through the diffuse right hepatic lobe tumor vascularity.  Catheter was advanced into the proper hepatic artery over a Glidewire. Selective proper hepatic angiogram performed.  Proper hepatic angiogram: This demonstrates patency of the right and left hepatic arteries. Arterial portal venous shunting is demonstrated through both the left and and right hepatic arteries. There is retrograde flow demonstrated in the left portal vein. Main portal vein is visualized with retrograde flow indicative of the portal hypertension. Diffuse tumor vascularity present predominately throughout the right hepatic lobe.  Renegade high flow micro catheter was advanced into the right hepatic artery. Selective right hepatic angiogram performed.  Right hepatic angiogram: This demonstrates patency of the right hepatic artery. Peripheral tumor vascularity noted with arterial portal venous shunting.  Bland embolization: From dthi slocation, right hepatic artery bland embolization was performed with 1.5 vials of 700 - 900 micron embospheres. This is to  decrease the degree of arterial portal venous shunting prior to administering drug-eluting beads.  Following bland embolization, there is no longer significant arterial portal venous shunting demonstrated.  Drug-eluting beads -trans arterial chemo embolization: Again from the same peripheral right hepatic artery micro catheter, 112.5 mg doxorubicin drug-eluting beads/ trans arterial chemo embolization was performed.  A post embolization right hepatic angiogram demonstrates preserved antegrade flow throughout the hepatic vasculature but the arterial flow is significantly decreased and sluggish following the embolization procedure.  Five French catheter and micro catheter removed. Right common  femoral artery site was closed with a 5 [place] StarClose device. No immediate complication. Patient tolerated the procedure well. Patient will be admitted for overnight observation.  IMPRESSION: Successful right hepatic artery bland embolization with 700 - 900 micronEmbospheres to decrease the arterial portal venous shunting through the tumor vascularity.  This was followed by, successful drug-eluting beads trans arterial chemo embolization (total dose administered 112.5 mg doxorubicin).   Electronically Signed   By: Daryll Brod M.D.   On: 09/16/2013 11:30    Anti-infectives: Anti-infectives   Start     Dose/Rate Route Frequency Ordered Stop   09/16/13 0730  piperacillin-tazobactam (ZOSYN) IVPB 3.375 g     3.375 g 12.5 mL/hr over 240 Minutes Intravenous  Once 09/16/13 0720 09/16/13 1301      Assessment/Plan: s/p rt hepatic artery bland embolization/DEB-TACE for [facility] 8/25; d/c hydrocodone, add toradol for pain, zofran prn nausea; diet as tolerated; check am labs; f/u CT /IR clinic visit in 1 month  LOS: 0 days    Jerianne Anselmo,D [facility] 09/16/2013

## 2013-09-16 NOTE — Progress Notes (Addendum)
Pt transported w/ RN to floor, dtr at bedside.  Rt groin dry and intact, 3+ dorsalis pedis rt.

## 2013-09-16 NOTE — Progress Notes (Signed)
Called for food tray for pt, [place] sandwich with peaches and sugar-free jello. VSS  Lying flat. Called bed control, still no beds available.

## 2013-09-16 NOTE — H&P (Signed)
Whitney Patel is an 58 y.o. female.   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 Despite the  extensive tumor burden, she remains asymptomatic and continues to  work. Excellent functional status. She recently underwent a Y90  angiography with shunt calculation to the lungs. 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 Therefore she is not a candidate for Y  90 embolization. She presents today for elective hepatic chemoembolization (DEB-TACE).    Past Medical History  Diagnosis Date  . Diabetes mellitus   . Hypertension   . Hepatocellular carcinoma     Past Surgical History  Procedure Laterality Date  . Cesarean section      No family history on file. Social History:  reports that she has been smoking Cigarettes.  She started smoking about 41 years ago. She has a 7.5 pack-year smoking history. She does not have any smokeless tobacco history on file. She reports that she drinks alcohol. She reports that she does not use illicit drugs.  Allergies: No Known Allergies  Current outpatient prescriptions:amLODipine (NORVASC) 10 MG tablet, Take 10 mg by mouth every morning., Disp: , Rfl: ;  carvedilol (COREG) 12.5 MG tablet, Take 1 tablet (12.5 mg total) by mouth 2 (two) times daily with a meal., Disp: 60 tablet, Rfl: 1;  glucose blood test strip, Please dispense glucose test strips for Accu [employer]k [employer]  to check Blood sugars two to three times daily . Also dispense lancets, Disp: 100 each, Rfl: 10 hydrochlorothiazide (MICROZIDE) 12.5 MG capsule, Take 12.5 mg by mouth 2 (two) times daily., Disp: , Rfl: ;  lisinopril (PRINIVIL,ZESTRIL) 20 MG tablet, Take 1 tablet (20 mg total) by mouth 2 (two) times daily., Disp: 60 tablet, Rfl: 1 Current facility-administered medications:0.9 %  sodium chloride infusion, ,  Intravenous, Once, [name], PA-C;  dexamethasone (DECADRON) injection 8 mg, 8 mg, Intravenous, Once, [name], PA-C;  DOXOrubicin (ADRIAMYCIN) chemo injection 150 mg, 150 mg, Intra-arterial, Once, [name], MD;  ondansetron [employer]) 4 MG/2ML injection, , , , ;  ondansetron (ZOFRAN) injection 4 mg, 4 mg, Intravenous, Once, [name], PA-C piperacillin-tazobactam (ZOSYN) IVPB 3.375 g, 3.375 g, Intravenous, Once, [name], PA-C   Results for orders placed during the hospital encounter of 09/16/13 (from the past 48 hour(s))  GLUCOSE, CAPILLARY     Status: Abnormal   Collection Time    09/16/13  7:31 AM      Result Value Ref Range   Glucose-Capillary 112 (*) 70 - 99 mg/dL   09/16/13 labs pending Review of Systems  Constitutional: Negative for fever and chills.  Respiratory: Negative for cough, hemoptysis and shortness of breath.   Cardiovascular: Negative for chest pain.  Gastrointestinal: Positive for abdominal pain. Negative for vomiting and blood in stool.       Occ nausea  Genitourinary: Negative for dysuria and hematuria.  Musculoskeletal: Negative for back pain.  Neurological: Negative for headaches.  Endo/Heme/Allergies: Does not bruise/bleed easily.    Blood pressure 162/99, pulse 68, temperature 98.5 F (36.9 C), temperature source Oral, resp. rate 18, height 5\' 5"  (1.651 m), weight 133 lb (60.328 kg). Physical Exam  Constitutional: She is oriented to person, place, and time. She appears well-developed and well-nourished.  Cardiovascular: Normal rate and regular rhythm.   Murmur heard. Respiratory: Effort normal and breath sounds normal.  GI: Soft. Bowel  sounds are normal. There is tenderness.  Mild hepatomegaly  Musculoskeletal: Normal range of motion. She exhibits no edema.  Neurological: She is alert and oriented to person, place, and time.     Assessment/Plan 58 year old female with hepatitis-C and multifocal hepatocellular  carcinoma  predominantly in the right hepatic lobe. Despite the  extensive tumor burden, she remains asymptomatic and continues to  work. Excellent functional status. She recently underwent a Y90  angiography with shunt calculation to the lungs. 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 Therefore she is not a candidate for Y  90 embolization. She presents today for elective hepatic chemoembolization (DEB-TACE). Details/risks of procedure d/w pt/husband with their understanding and consent.  [name],D KEVIN 09/16/2013, 7:50 AM

## 2013-09-16 NOTE — Progress Notes (Signed)
Called for bed assignment, no beds available on med surg

## 2013-09-17 DIAGNOSIS — C228 Malignant neoplasm of liver, primary, unspecified as to type: Secondary | ICD-10-CM | POA: Diagnosis not present

## 2013-09-17 DIAGNOSIS — C229 Malignant neoplasm of liver, not specified as primary or secondary: Secondary | ICD-10-CM | POA: Diagnosis present

## 2013-09-17 LAB — COMPREHENSIVE METABOLIC PANEL WITH GFR
ALT: 105 U/L — ABNORMAL HIGH (ref 0–35)
AST: 178 U/L — ABNORMAL HIGH (ref 0–37)
Albumin: 3.6 g/dL (ref 3.5–5.2)
Alkaline Phosphatase: 178 U/L — ABNORMAL HIGH (ref 39–117)
Anion gap: 17 — ABNORMAL HIGH (ref 5–15)
BUN: 10 mg/dL (ref 6–23)
CO2: 25 meq/L (ref 19–32)
Calcium: 9.3 mg/dL (ref 8.4–10.5)
Chloride: 95 meq/L — ABNORMAL LOW (ref 96–112)
Creatinine, Ser: 0.55 mg/dL (ref 0.50–1.10)
GFR calc Af Amer: >90 mL/min
GFR calc non Af Amer: >90 mL/min
Glucose, Bld: 84 mg/dL (ref 70–99)
Potassium: 3.2 meq/L — ABNORMAL LOW (ref 3.7–5.3)
Sodium: 137 meq/L (ref 137–147)
Total Bilirubin: 0.6 mg/dL (ref 0.3–1.2)
Total Protein: 7.9 g/dL (ref 6.0–8.3)

## 2013-09-17 LAB — CBC
HCT: 35 % — ABNORMAL LOW (ref 36.0–46.0)
Hemoglobin: 12.2 g/dL (ref 12.0–15.0)
MCH: 30.2 pg (ref 26.0–34.0)
MCHC: 34.9 g/dL (ref 30.0–36.0)
MCV: 86.6 fL (ref 78.0–100.0)
Platelets: 228 K/uL (ref 150–400)
RBC: 4.04 MIL/uL (ref 3.87–5.11)
RDW: 13.3 % (ref 11.5–15.5)
WBC: 9 K/uL (ref 4.0–10.5)

## 2013-09-17 MED ORDER — HYDROCODONE-ACETAMINOPHEN 5-325 MG PO TABS
1.0000 | ORAL_TABLET | Freq: Four times a day (QID) | ORAL | Status: DC | PRN
  Administered 2013-09-17: 1 via ORAL
  Administered 2013-09-17: 2 via ORAL
  Filled 2013-09-17: qty 1
  Filled 2013-09-17: qty 2

## 2013-09-17 NOTE — Progress Notes (Signed)
1 Day Post-Op  Subjective: Pt with hepatocellular carcinoma Not Y90 candidate secondary lung shunt percentage calculated in procedure 08/29/13 in IR Was then scheduled instead for Hepatic arteriogram with bland embolization/with drug eluding beads/transarterial chemoembolization- performed 8/25 in IR with Dr Daryll Brod Pt has done well overnight Some abd pain (noted even before procedure) And off and on nausea Slept well Urinating well  Objective: Vital signs in last 24 hours: Temp:  [98 F (36.7 C)-98.7 F (37.1 C)] 98.7 F (37.1 C) (08/26 0427) Pulse Rate:  [58-76] 62 (08/26 0427) Resp:  [9-19] 16 (08/26 0427) BP: (123-175)/(62-93) 151/63 mmHg (08/26 0427) SpO2:  [92 %-100 %] 98 % (08/26 0427) Last BM Date:  (pt stated not today)  Intake/Output from previous day: 08/25 0701 - 08/26 0700 In: 3 [I.V.:3] Out: 1400 [Urine:1400] Intake/Output this shift:    PE:  Afeb; vss Lungs CTA Abd: soft; mild pain to palpate Heart: RRR Rt groin NT no bleeding; no hematoma Rt foot 2+ pulses Labs : H/H stable Sl increase in LFTs  Lab Results:   Recent Labs  09/16/13 0721 09/17/13 0555  WBC 6.3 9.0  HGB 12.0 12.2  HCT 35.7* 35.0*  PLT 208 228   BMET  Recent Labs  09/16/13 0721 09/17/13 0555  NA 143 137  K 3.8 3.2*  CL 103 95*  CO2 28 25  GLUCOSE 116* 84  BUN 7 10  CREATININE 0.50 0.55  CALCIUM 9.1 9.3   PT/INR  Recent Labs  09/16/13 0721  LABPROT 13.8  INR 1.06   ABG No results found for this basename: PHART, PCO2, PO2, HCO3,  in the last 72 hours  Studies/Results: Ir Angiogram Visceral Selective  09/16/2013   CLINICAL DATA:  Hepatocellular carcinoma, non operative candidate  EXAM: ULTRASOUND GUIDANCE FOR VASCULAR ACCESS  CELIAC ANGIOGRAM  PROPER HEPATIC ANGIOGRAM  RIGHT HEPATIC ANGIOGRAM  RIGHT HEPATIC BLAND EMBOLIZATION AND DRUG-ELUTING BEADS TRANSARTERIAL CHEMO EMBOLIZATION  Date:  8/25/20158/25/2015 11:03 am  Radiologist:  M. Daryll Brod, MD  Guidance:   Ultrasound and fluoroscopic  FLUOROSCOPY TIME:  12 min 18 seconds  MEDICATIONS AND MEDICAL HISTORY: 8 mg Decadron, 4 mg Zofran, 3.375 g Zosyn administered within 1 hr of the procedure, 5 mg Versed, 250 mcg fentanyl  ANESTHESIA/SEDATION: 1 hr 15 min  CONTRAST:  153mL OMNIPAQUE IOHEXOL 300 MG/ML  SOLN  COMPLICATIONS: No immediate  PROCEDURE: Informed consent was obtained from the patient following explanation of the procedure, risks, benefits and alternatives. The patient understands, agrees and consents for the procedure. All questions were addressed. A time out was performed.  Maximal barrier sterile technique utilized including caps, mask, sterile gowns, sterile gloves, large sterile drape, hand hygiene, and Betadine.  Under sterile conditions and local anesthesia, ultrasound micropuncture access was performed of the right common femoral artery. Five French sheath inserted over a guidewire. C2 catheter utilized to select the celiac origin over a Bentson guidewire. Selective celiac angiogram performed.  Celiac angiogram: Celiac artery is widely patent. Hepatic, gastroduodenal, and splenic vasculature are patent. Arterial portal venous shunting re- demonstrated through the diffuse right hepatic lobe tumor vascularity.  Catheter was advanced into the proper hepatic artery over a Glidewire. Selective proper hepatic angiogram performed.  Proper hepatic angiogram: This demonstrates patency of the right and left hepatic arteries. Arterial portal venous shunting is demonstrated through both the left and and right hepatic arteries. There is retrograde flow demonstrated in the left portal vein. Main portal vein is visualized with retrograde flow indicative of the portal  hypertension. Diffuse tumor vascularity present predominately throughout the right hepatic lobe.  Renegade high flow micro catheter was advanced into the right hepatic artery. Selective right hepatic angiogram performed.  Right hepatic angiogram: This  demonstrates patency of the right hepatic artery. Peripheral tumor vascularity noted with arterial portal venous shunting.  Bland embolization: From dthi slocation, right hepatic artery bland embolization was performed with 1.5 vials of 700 - 900 micron embospheres. This is to decrease the degree of arterial portal venous shunting prior to administering drug-eluting beads.  Following bland embolization, there is no longer significant arterial portal venous shunting demonstrated.  Drug-eluting beads -trans arterial chemo embolization: Again from the same peripheral right hepatic artery micro catheter, 112.5 mg doxorubicin drug-eluting beads/ trans arterial chemo embolization was performed.  A post embolization right hepatic angiogram demonstrates preserved antegrade flow throughout the hepatic vasculature but the arterial flow is significantly decreased and sluggish following the embolization procedure.  Five French catheter and micro catheter removed. Right common femoral artery site was closed with a 5 [place] StarClose device. No immediate complication. Patient tolerated the procedure well. Patient will be admitted for overnight observation.  IMPRESSION: Successful right hepatic artery bland embolization with 700 - 900 micronEmbospheres to decrease the arterial portal venous shunting through the tumor vascularity.  This was followed by, successful drug-eluting beads trans arterial chemo embolization (total dose administered 112.5 mg doxorubicin).   Electronically Signed   By: Daryll Brod M.D.   On: 09/16/2013 11:30   Ir Angiogram Selective Each Additional Vessel  09/16/2013   CLINICAL DATA:  Hepatocellular carcinoma, non operative candidate  EXAM: ULTRASOUND GUIDANCE FOR VASCULAR ACCESS  CELIAC ANGIOGRAM  PROPER HEPATIC ANGIOGRAM  RIGHT HEPATIC ANGIOGRAM  RIGHT HEPATIC BLAND EMBOLIZATION AND DRUG-ELUTING BEADS TRANSARTERIAL CHEMO EMBOLIZATION  Date:  8/25/20158/25/2015 11:03 am  Radiologist:  M. Daryll Brod,  MD  Guidance:  Ultrasound and fluoroscopic  FLUOROSCOPY TIME:  12 min 18 seconds  MEDICATIONS AND MEDICAL HISTORY: 8 mg Decadron, 4 mg Zofran, 3.375 g Zosyn administered within 1 hr of the procedure, 5 mg Versed, 250 mcg fentanyl  ANESTHESIA/SEDATION: 1 hr 15 min  CONTRAST:  16mL OMNIPAQUE IOHEXOL 300 MG/ML  SOLN  COMPLICATIONS: No immediate  PROCEDURE: Informed consent was obtained from the patient following explanation of the procedure, risks, benefits and alternatives. The patient understands, agrees and consents for the procedure. All questions were addressed. A time out was performed.  Maximal barrier sterile technique utilized including caps, mask, sterile gowns, sterile gloves, large sterile drape, hand hygiene, and Betadine.  Under sterile conditions and local anesthesia, ultrasound micropuncture access was performed of the right common femoral artery. Five French sheath inserted over a guidewire. C2 catheter utilized to select the celiac origin over a Bentson guidewire. Selective celiac angiogram performed.  Celiac angiogram: Celiac artery is widely patent. Hepatic, gastroduodenal, and splenic vasculature are patent. Arterial portal venous shunting re- demonstrated through the diffuse right hepatic lobe tumor vascularity.  Catheter was advanced into the proper hepatic artery over a Glidewire. Selective proper hepatic angiogram performed.  Proper hepatic angiogram: This demonstrates patency of the right and left hepatic arteries. Arterial portal venous shunting is demonstrated through both the left and and right hepatic arteries. There is retrograde flow demonstrated in the left portal vein. Main portal vein is visualized with retrograde flow indicative of the portal hypertension. Diffuse tumor vascularity present predominately throughout the right hepatic lobe.  Renegade high flow micro catheter was advanced into the right hepatic artery. Selective right hepatic angiogram performed.  Right hepatic  angiogram: This demonstrates patency of the right hepatic artery. Peripheral tumor vascularity noted with arterial portal venous shunting.  Bland embolization: From dthi slocation, right hepatic artery bland embolization was performed with 1.5 vials of 700 - 900 micron embospheres. This is to decrease the degree of arterial portal venous shunting prior to administering drug-eluting beads.  Following bland embolization, there is no longer significant arterial portal venous shunting demonstrated.  Drug-eluting beads -trans arterial chemo embolization: Again from the same peripheral right hepatic artery micro catheter, 112.5 mg doxorubicin drug-eluting beads/ trans arterial chemo embolization was performed.  A post embolization right hepatic angiogram demonstrates preserved antegrade flow throughout the hepatic vasculature but the arterial flow is significantly decreased and sluggish following the embolization procedure.  Five French catheter and micro catheter removed. Right common femoral artery site was closed with a 5 [place] StarClose device. No immediate complication. Patient tolerated the procedure well. Patient will be admitted for overnight observation.  IMPRESSION: Successful right hepatic artery bland embolization with 700 - 900 micronEmbospheres to decrease the arterial portal venous shunting through the tumor vascularity.  This was followed by, successful drug-eluting beads trans arterial chemo embolization (total dose administered 112.5 mg doxorubicin).   Electronically Signed   By: Daryll Brod M.D.   On: 09/16/2013 11:30   Ir Angiogram Selective Each Additional Vessel  09/16/2013   CLINICAL DATA:  Hepatocellular carcinoma, non operative candidate  EXAM: ULTRASOUND GUIDANCE FOR VASCULAR ACCESS  CELIAC ANGIOGRAM  PROPER HEPATIC ANGIOGRAM  RIGHT HEPATIC ANGIOGRAM  RIGHT HEPATIC BLAND EMBOLIZATION AND DRUG-ELUTING BEADS TRANSARTERIAL CHEMO EMBOLIZATION  Date:  8/25/20158/25/2015 11:03 am  Radiologist:   M. Daryll Brod, MD  Guidance:  Ultrasound and fluoroscopic  FLUOROSCOPY TIME:  12 min 18 seconds  MEDICATIONS AND MEDICAL HISTORY: 8 mg Decadron, 4 mg Zofran, 3.375 g Zosyn administered within 1 hr of the procedure, 5 mg Versed, 250 mcg fentanyl  ANESTHESIA/SEDATION: 1 hr 15 min  CONTRAST:  157mL OMNIPAQUE IOHEXOL 300 MG/ML  SOLN  COMPLICATIONS: No immediate  PROCEDURE: Informed consent was obtained from the patient following explanation of the procedure, risks, benefits and alternatives. The patient understands, agrees and consents for the procedure. All questions were addressed. A time out was performed.  Maximal barrier sterile technique utilized including caps, mask, sterile gowns, sterile gloves, large sterile drape, hand hygiene, and Betadine.  Under sterile conditions and local anesthesia, ultrasound micropuncture access was performed of the right common femoral artery. Five French sheath inserted over a guidewire. C2 catheter utilized to select the celiac origin over a Bentson guidewire. Selective celiac angiogram performed.  Celiac angiogram: Celiac artery is widely patent. Hepatic, gastroduodenal, and splenic vasculature are patent. Arterial portal venous shunting re- demonstrated through the diffuse right hepatic lobe tumor vascularity.  Catheter was advanced into the proper hepatic artery over a Glidewire. Selective proper hepatic angiogram performed.  Proper hepatic angiogram: This demonstrates patency of the right and left hepatic arteries. Arterial portal venous shunting is demonstrated through both the left and and right hepatic arteries. There is retrograde flow demonstrated in the left portal vein. Main portal vein is visualized with retrograde flow indicative of the portal hypertension. Diffuse tumor vascularity present predominately throughout the right hepatic lobe.  Renegade high flow micro catheter was advanced into the right hepatic artery. Selective right hepatic angiogram performed.  Right  hepatic angiogram: This demonstrates patency of the right hepatic artery. Peripheral tumor vascularity noted with arterial portal venous shunting.  Bland embolization: From dthi slocation, right hepatic  artery bland embolization was performed with 1.5 vials of 700 - 900 micron embospheres. This is to decrease the degree of arterial portal venous shunting prior to administering drug-eluting beads.  Following bland embolization, there is no longer significant arterial portal venous shunting demonstrated.  Drug-eluting beads -trans arterial chemo embolization: Again from the same peripheral right hepatic artery micro catheter, 112.5 mg doxorubicin drug-eluting beads/ trans arterial chemo embolization was performed.  A post embolization right hepatic angiogram demonstrates preserved antegrade flow throughout the hepatic vasculature but the arterial flow is significantly decreased and sluggish following the embolization procedure.  Five French catheter and micro catheter removed. Right common femoral artery site was closed with a 5 [place] StarClose device. No immediate complication. Patient tolerated the procedure well. Patient will be admitted for overnight observation.  IMPRESSION: Successful right hepatic artery bland embolization with 700 - 900 micronEmbospheres to decrease the arterial portal venous shunting through the tumor vascularity.  This was followed by, successful drug-eluting beads trans arterial chemo embolization (total dose administered 112.5 mg doxorubicin).   Electronically Signed   By: Daryll Brod M.D.   On: 09/16/2013 11:30   Ir US Guide Vasc Access Right  09/16/2013   CLINICAL DATA:  Hepatocellular carcinoma, non operative candidate  EXAM: ULTRASOUND GUIDANCE FOR VASCULAR ACCESS  CELIAC ANGIOGRAM  PROPER HEPATIC ANGIOGRAM  RIGHT HEPATIC ANGIOGRAM  RIGHT HEPATIC BLAND EMBOLIZATION AND DRUG-ELUTING BEADS TRANSARTERIAL CHEMO EMBOLIZATION  Date:  8/25/20158/25/2015 11:03 am  Radiologist:  M. Daryll Brod, MD  Guidance:  Ultrasound and fluoroscopic  FLUOROSCOPY TIME:  12 min 18 seconds  MEDICATIONS AND MEDICAL HISTORY: 8 mg Decadron, 4 mg Zofran, 3.375 g Zosyn administered within 1 hr of the procedure, 5 mg Versed, 250 mcg fentanyl  ANESTHESIA/SEDATION: 1 hr 15 min  CONTRAST:  135mL OMNIPAQUE IOHEXOL 300 MG/ML  SOLN  COMPLICATIONS: No immediate  PROCEDURE: Informed consent was obtained from the patient following explanation of the procedure, risks, benefits and alternatives. The patient understands, agrees and consents for the procedure. All questions were addressed. A time out was performed.  Maximal barrier sterile technique utilized including caps, mask, sterile gowns, sterile gloves, large sterile drape, hand hygiene, and Betadine.  Under sterile conditions and local anesthesia, ultrasound micropuncture access was performed of the right common femoral artery. Five French sheath inserted over a guidewire. C2 catheter utilized to select the celiac origin over a Bentson guidewire. Selective celiac angiogram performed.  Celiac angiogram: Celiac artery is widely patent. Hepatic, gastroduodenal, and splenic vasculature are patent. Arterial portal venous shunting re- demonstrated through the diffuse right hepatic lobe tumor vascularity.  Catheter was advanced into the proper hepatic artery over a Glidewire. Selective proper hepatic angiogram performed.  Proper hepatic angiogram: This demonstrates patency of the right and left hepatic arteries. Arterial portal venous shunting is demonstrated through both the left and and right hepatic arteries. There is retrograde flow demonstrated in the left portal vein. Main portal vein is visualized with retrograde flow indicative of the portal hypertension. Diffuse tumor vascularity present predominately throughout the right hepatic lobe.  Renegade high flow micro catheter was advanced into the right hepatic artery. Selective right hepatic angiogram performed.  Right hepatic  angiogram: This demonstrates patency of the right hepatic artery. Peripheral tumor vascularity noted with arterial portal venous shunting.  Bland embolization: From dthi slocation, right hepatic artery bland embolization was performed with 1.5 vials of 700 - 900 micron embospheres. This is to decrease the degree of arterial portal venous shunting prior to administering drug-eluting  beads.  Following bland embolization, there is no longer significant arterial portal venous shunting demonstrated.  Drug-eluting beads -trans arterial chemo embolization: Again from the same peripheral right hepatic artery micro catheter, 112.5 mg doxorubicin drug-eluting beads/ trans arterial chemo embolization was performed.  A post embolization right hepatic angiogram demonstrates preserved antegrade flow throughout the hepatic vasculature but the arterial flow is significantly decreased and sluggish following the embolization procedure.  Five French catheter and micro catheter removed. Right common femoral artery site was closed with a 5 [place] StarClose device. No immediate complication. Patient tolerated the procedure well. Patient will be admitted for overnight observation.  IMPRESSION: Successful right hepatic artery bland embolization with 700 - 900 micronEmbospheres to decrease the arterial portal venous shunting through the tumor vascularity.  This was followed by, successful drug-eluting beads trans arterial chemo embolization (total dose administered 112.5 mg doxorubicin).   Electronically Signed   By: Daryll Brod M.D.   On: 09/16/2013 11:30   Ir Embo Tumor Organ Ischemia Infarct Inc Guide Roadmapping  09/16/2013   CLINICAL DATA:  Hepatocellular carcinoma, non operative candidate  EXAM: ULTRASOUND GUIDANCE FOR VASCULAR ACCESS  CELIAC ANGIOGRAM  PROPER HEPATIC ANGIOGRAM  RIGHT HEPATIC ANGIOGRAM  RIGHT HEPATIC BLAND EMBOLIZATION AND DRUG-ELUTING BEADS TRANSARTERIAL CHEMO EMBOLIZATION  Date:  8/25/20158/25/2015 11:03 am   Radiologist:  M. Daryll Brod, MD  Guidance:  Ultrasound and fluoroscopic  FLUOROSCOPY TIME:  12 min 18 seconds  MEDICATIONS AND MEDICAL HISTORY: 8 mg Decadron, 4 mg Zofran, 3.375 g Zosyn administered within 1 hr of the procedure, 5 mg Versed, 250 mcg fentanyl  ANESTHESIA/SEDATION: 1 hr 15 min  CONTRAST:  138mL OMNIPAQUE IOHEXOL 300 MG/ML  SOLN  COMPLICATIONS: No immediate  PROCEDURE: Informed consent was obtained from the patient following explanation of the procedure, risks, benefits and alternatives. The patient understands, agrees and consents for the procedure. All questions were addressed. A time out was performed.  Maximal barrier sterile technique utilized including caps, mask, sterile gowns, sterile gloves, large sterile drape, hand hygiene, and Betadine.  Under sterile conditions and local anesthesia, ultrasound micropuncture access was performed of the right common femoral artery. Five French sheath inserted over a guidewire. C2 catheter utilized to select the celiac origin over a Bentson guidewire. Selective celiac angiogram performed.  Celiac angiogram: Celiac artery is widely patent. Hepatic, gastroduodenal, and splenic vasculature are patent. Arterial portal venous shunting re- demonstrated through the diffuse right hepatic lobe tumor vascularity.  Catheter was advanced into the proper hepatic artery over a Glidewire. Selective proper hepatic angiogram performed.  Proper hepatic angiogram: This demonstrates patency of the right and left hepatic arteries. Arterial portal venous shunting is demonstrated through both the left and and right hepatic arteries. There is retrograde flow demonstrated in the left portal vein. Main portal vein is visualized with retrograde flow indicative of the portal hypertension. Diffuse tumor vascularity present predominately throughout the right hepatic lobe.  Renegade high flow micro catheter was advanced into the right hepatic artery. Selective right hepatic angiogram  performed.  Right hepatic angiogram: This demonstrates patency of the right hepatic artery. Peripheral tumor vascularity noted with arterial portal venous shunting.  Bland embolization: From dthi slocation, right hepatic artery bland embolization was performed with 1.5 vials of 700 - 900 micron embospheres. This is to decrease the degree of arterial portal venous shunting prior to administering drug-eluting beads.  Following bland embolization, there is no longer significant arterial portal venous shunting demonstrated.  Drug-eluting beads -trans arterial chemo embolization: Again from the same  peripheral right hepatic artery micro catheter, 112.5 mg doxorubicin drug-eluting beads/ trans arterial chemo embolization was performed.  A post embolization right hepatic angiogram demonstrates preserved antegrade flow throughout the hepatic vasculature but the arterial flow is significantly decreased and sluggish following the embolization procedure.  Five French catheter and micro catheter removed. Right common femoral artery site was closed with a 5 [place] StarClose device. No immediate complication. Patient tolerated the procedure well. Patient will be admitted for overnight observation.  IMPRESSION: Successful right hepatic artery bland embolization with 700 - 900 micronEmbospheres to decrease the arterial portal venous shunting through the tumor vascularity.  This was followed by, successful drug-eluting beads trans arterial chemo embolization (total dose administered 112.5 mg doxorubicin).   Electronically Signed   By: Daryll Brod M.D.   On: 09/16/2013 11:30    Anti-infectives: Anti-infectives   Start     Dose/Rate Route Frequency Ordered Stop   09/16/13 0730  piperacillin-tazobactam (ZOSYN) IVPB 3.375 g     3.375 g 12.5 mL/hr over 240 Minutes Intravenous  Once 09/16/13 0720 09/16/13 1301      Assessment/Plan: s/p * No procedures listed *  HCC Hepatic bland embolization/ DEB-TACE 8/25 Minimal abd  pain Minimal nausea  Will treat nausea and pain Plan for dc today Discussed with Dr Annamaria Boots   LOS: 1 day    Faiz Weber A 09/17/2013

## 2013-09-17 NOTE — Discharge Summary (Signed)
Physician Discharge Summary  Patient ID: Whitney Patel MRN: [id number] DOB/AGE: 56-Jan-1957 56 y.o.  Admit date: 09/16/2013 Discharge date: 09/17/2013  Admission Diagnoses:  Hepatocellular carcinoma  Discharge Diagnoses: Treatment of Hepatocelluar carcinoma  Active problems: Hepatic ca; smoker; DM; HTN; Hep C; Cirrhosis; Drug addiction- previous  Discharged Condition: stable; improved  Hospital Course: Pt with Hx Hep C; cirrhosis and Hepatocellular carcinoma. Deemed not a surgical candidate and unfortunately not a candidate for Y90 treatment secondary high percentage lung shunt calculation during previous hepatic arteriogram. Right lobe hepatocellular carcinoma was treated with Drug Eluting Beads / TransArterial Embolization after Rt hepatic artery bland embolization 8/25 with Dr Annamaria Boots in IR. Pt tolerated well.  Was admitted overnight for observation. Has done well. Did have one episode of vomit yesterday after procedure - none since. Minimal nausea ongoing- although better.  Continues to have mild abd pain- present even before procedure. Slept well. Drinking fluids- urinating well on own. Eating lunch now. No nausea at present. Discussed pt status with Dr Annamaria Boots. I have seen and examined pt. Plan for discharge today.  Consults: None  Significant Diagnostic Studies: Hepatic arteriogram  Treatments: Rt hepatic artery bland embolization with Drug Eluting Beads- Trans arterial Chemoembolization  Discharge Exam: Blood pressure 151/63, pulse 62, temperature 98.7 F (37.1 C), temperature source Oral, resp. rate 16, height 5\' 5"  (1.651 m), weight 60.328 kg (133 lb), SpO2 98.00%.  PE: A/O; pleasant VSS; afeb Resting Heart: RRR Lungs: CTA Abd: soft; mild tender Rt groin: NT; no bleeding; no hematoma Rt foot: 2+ pulses H/H stable LFTs with mild increase- expected  Results for orders placed during the hospital encounter of 09/16/13  APTT      Result Value Ref Range   aPTT 30  24  - 37 seconds  CBC WITH DIFFERENTIAL      Result Value Ref Range   WBC 6.3  4.0 - 10.5 K/uL   RBC 3.94  3.87 - 5.11 MIL/uL   Hemoglobin 12.0  12.0 - 15.0 g/dL   HCT 35.7 (*) 36.0 - 46.0 %   MCV 90.6  78.0 - 100.0 fL   MCH 30.5  26.0 - 34.0 pg   MCHC 33.6  30.0 - 36.0 g/dL   RDW 13.7  11.5 - 15.5 %   Platelets 208  150 - 400 K/uL   Neutrophils Relative % 58  43 - 77 %   Neutro Abs 3.6  1.7 - 7.7 K/uL   Lymphocytes Relative 34  12 - 46 %   Lymphs Abs 2.2  0.7 - 4.0 K/uL   Monocytes Relative 7  3 - 12 %   Monocytes Absolute 0.4  0.1 - 1.0 K/uL   Eosinophils Relative 1  0 - 5 %   Eosinophils Absolute 0.1  0.0 - 0.7 K/uL   Basophils Relative 0  0 - 1 %   Basophils Absolute 0.0  0.0 - 0.1 K/uL  COMPREHENSIVE METABOLIC PANEL      Result Value Ref Range   Sodium 143  137 - 147 mEq/L   Potassium 3.8  3.7 - 5.3 mEq/L   Chloride 103  96 - 112 mEq/L   CO2 28  19 - 32 mEq/L   Glucose, Bld 116 (*) 70 - 99 mg/dL   BUN 7  6 - 23 mg/dL   Creatinine, Ser 0.50  0.50 - 1.10 mg/dL   Calcium 9.1  8.4 - 10.5 mg/dL   Total Protein 7.8  6.0 - 8.3 g/dL   Albumin 3.7  3.5 - 5.2 g/dL   AST 103 (*) 0 - 37 U/L   ALT 78 (*) 0 - 35 U/L   Alkaline Phosphatase 181 (*) 39 - 117 U/L   Total Bilirubin 0.5  0.3 - 1.2 mg/dL   GFR calc non Af Amer >90  >90 mL/min   GFR calc Af Amer >90  >90 mL/min   Anion gap 12  5 - 15  PROTIME-INR      Result Value Ref Range   Prothrombin Time 13.8  11.6 - 15.2 seconds   INR 1.06  0.00 - 1.49  GLUCOSE, CAPILLARY      Result Value Ref Range   Glucose-Capillary 112 (*) 70 - 99 mg/dL  CBC      Result Value Ref Range   WBC 9.0  4.0 - 10.5 K/uL   RBC 4.04  3.87 - 5.11 MIL/uL   Hemoglobin 12.2  12.0 - 15.0 g/dL   HCT 35.0 (*) 36.0 - 46.0 %   MCV 86.6  78.0 - 100.0 fL   MCH 30.2  26.0 - 34.0 pg   MCHC 34.9  30.0 - 36.0 g/dL   RDW 13.3  11.5 - 15.5 %   Platelets 228  150 - 400 K/uL  COMPREHENSIVE METABOLIC PANEL      Result Value Ref Range   Sodium 137  137 - 147 mEq/L    Potassium 3.2 (*) 3.7 - 5.3 mEq/L   Chloride 95 (*) 96 - 112 mEq/L   CO2 25  19 - 32 mEq/L   Glucose, Bld 84  70 - 99 mg/dL   BUN 10  6 - 23 mg/dL   Creatinine, Ser 0.55  0.50 - 1.10 mg/dL   Calcium 9.3  8.4 - 10.5 mg/dL   Total Protein 7.9  6.0 - 8.3 g/dL   Albumin 3.6  3.5 - 5.2 g/dL   AST 178 (*) 0 - 37 U/L   ALT 105 (*) 0 - 35 U/L   Alkaline Phosphatase 178 (*) 39 - 117 U/L   Total Bilirubin 0.6  0.3 - 1.2 mg/dL   GFR calc non Af Amer >90  >90 mL/min   GFR calc Af Amer >90  >90 mL/min   Anion gap 17 (*) 5 - 15     Disposition: Hx hepatocellular carcinoma Hepatic arteriogram with Rt hepatic artery bland embolization. Drug Eluting Beads with Transarterial Chemoembolization performed in IR 8/25 Doing well. Plan for dc to home. Phenergan 25 mg #10-- Rx to pt Continue all home meds Pt to follow up with Dr Annamaria Boots in 1 mo---scheduler will call with time and date. Pt and husband have good understanding of plan.  Discharge Instructions   Call MD for:  difficulty breathing, headache or visual disturbances    Complete by:  As directed      Call MD for:  extreme fatigue    Complete by:  As directed      Call MD for:  hives    Complete by:  As directed      Call MD for:  persistant dizziness or light-headedness    Complete by:  As directed      Call MD for:  persistant nausea and vomiting    Complete by:  As directed      Call MD for:  redness, tenderness, or signs of infection (pain, swelling, redness, odor or green/yellow discharge around incision site)    Complete by:  As directed      Call MD for:  severe uncontrolled pain    Complete by:  As directed      Call MD for:  temperature >100.4    Complete by:  As directed      Diet - low sodium heart healthy    Complete by:  As directed      Discharge instructions    Complete by:  As directed   Restful at home x 1 week; follow up with Dr Annamaria Boots 1 mo---scheduler will call pt with time and date; call [phone] if questions or  concerns     Discharge wound care:    Complete by:  As directed   May shower tomorrow; change bandage at Rt groin to bandaid tomorrow--clean band aid daily x 1 week     Driving Restrictions    Complete by:  As directed   No driving x 1 week     Increase activity slowly    Complete by:  As directed      Lifting restrictions    Complete by:  As directed   No lifting over 10 lbs x 1 week            Medication List         amLODipine 10 MG tablet  Commonly known as:  NORVASC  Take 10 mg by mouth every morning.     carvedilol 12.5 MG tablet  Commonly known as:  COREG  Take 1 tablet (12.5 mg total) by mouth 2 (two) times daily with a meal.     glucose blood test strip  - Please dispense glucose test strips for [employer]u [employer]k [employer]iva  to check Blood sugars two to three times daily .  - Also dispense lancets     hydrochlorothiazide 12.5 MG capsule  Commonly known as:  MICROZIDE  Take 12.5 mg by mouth 2 (two) times daily.     lisinopril 20 MG tablet  Commonly known as:  PRINIVIL,ZESTRIL  Take 1 tablet (20 mg total) by mouth 2 (two) times daily.         Signed: Codee Tutson A 09/17/2013, 12:15 PM

## 2013-09-17 NOTE — Progress Notes (Signed)
UR completed 

## 2013-09-18 NOTE — Telephone Encounter (Signed)
Left voice message for pt to call and schedule an appointment with PCP. Derl Barrow, RN

## 2013-09-24 ENCOUNTER — Encounter: Payer: Self-pay | Admitting: Family Medicine

## 2013-09-24 ENCOUNTER — Ambulatory Visit (INDEPENDENT_AMBULATORY_CARE_PROVIDER_SITE_OTHER): Payer: No Typology Code available for payment source | Admitting: Family Medicine

## 2013-09-24 VITALS — Temp 99.0°F | Wt 133.0 lb

## 2013-09-24 DIAGNOSIS — F1911 Other psychoactive substance abuse, in remission: Secondary | ICD-10-CM

## 2013-09-24 DIAGNOSIS — R42 Dizziness and giddiness: Secondary | ICD-10-CM

## 2013-09-24 LAB — CBC
HEMATOCRIT: 31.8 % — AB (ref 36.0–46.0)
HEMOGLOBIN: 10.9 g/dL — AB (ref 12.0–15.0)
MCH: 30 pg (ref 26.0–34.0)
MCHC: 34.3 g/dL (ref 30.0–36.0)
MCV: 87.6 fL (ref 78.0–100.0)
Platelets: 426 10*3/uL — ABNORMAL HIGH (ref 150–400)
RBC: 3.63 MIL/uL — ABNORMAL LOW (ref 3.87–5.11)
RDW: 13.2 % (ref 11.5–15.5)
WBC: 7.7 10*3/uL (ref 4.0–10.5)

## 2013-09-24 LAB — COMPREHENSIVE METABOLIC PANEL
ALT: 40 U/L — ABNORMAL HIGH (ref 0–35)
AST: 45 U/L — AB (ref 0–37)
Albumin: 3.9 g/dL (ref 3.5–5.2)
Alkaline Phosphatase: 139 U/L — ABNORMAL HIGH (ref 39–117)
BUN: 12 mg/dL (ref 6–23)
CO2: 25 mEq/L (ref 19–32)
CREATININE: 0.79 mg/dL (ref 0.50–1.10)
Calcium: 8.9 mg/dL (ref 8.4–10.5)
Chloride: 100 mEq/L (ref 96–112)
Glucose, Bld: 95 mg/dL (ref 70–99)
Potassium: 3.9 mEq/L (ref 3.5–5.3)
Sodium: 138 mEq/L (ref 135–145)
Total Bilirubin: 0.6 mg/dL (ref 0.2–1.2)
Total Protein: 7.6 g/dL (ref 6.0–8.3)

## 2013-09-24 MED ORDER — LISINOPRIL 20 MG PO TABS: 10.0000 mg | ORAL_TABLET | Freq: Two times a day (BID) | ORAL | Status: DC

## 2013-09-24 MED ORDER — CARVEDILOL 12.5 MG PO TABS: 6.2500 mg | ORAL_TABLET | Freq: Two times a day (BID) | ORAL | Status: DC

## 2013-09-24 NOTE — Assessment & Plan Note (Addendum)
DDx includes orthostasis (orthostatic VS positive in clinic) with poor intake and taking antihypertensives regularly for the past week, anemia, malnutrition with cancer diagnosis, medication-induced, and metabolic derangement. Unlikely to be meniere's or BPPV given no other symptoms and not a room spinning sensation. Afebrile with normal VS other than orthostasis. Pulse not increasing with decreasing BP likely due to beta blockade. - CMET, CBC - Decrease lisinopril to 10mg  BID (from 20mg  BID) and coreg to 6.25mg  BID (from 12.5mg  BID).  - Stay hydrated. - Return precautions reviewed. F/u 1-2 weeks if no improvement. - Precepted with Dr Mingo Amber

## 2013-09-24 NOTE — Progress Notes (Signed)
Patient ID: Whitney Patel, female   DOB: 26-Dec-1955, 58 y.o.   MRN: [id number] Subjective:   CC: Dizziness  HPI:   Whitney Patel is an 58 y.o. female presenting with diszziness / lightheadedness for 5 days with h/o recently diagnosed hepatocellular carcinoma s/p recent hospitalization for treatment with drug eluting beads/transarterial embolization after right hepatic artery bland embolization 09/16/13. She reports that she has tried staying hydrated despite no appetite. Urine is normal. Position does not change dizziness. Symptoms seem present all the time with no pattern. She denies vision or hearing changes, neurologic changes, nausea, vomiting, tinnitus, syncope, fevers, chills, chest pian, dyspnea, leg swelling, bowel changes, or palpitations. She has not had new medications and denies missing any of her regular doses for the past week including her antihypertensives, though she had been out prior to that. She has not checked BP at home because she needs new batteries for her machine. She does not report any bleeding.    Review of Systems - Per HPI. Also reports losing weight.  PMH: Recent HCC dx No tobacco use in 5 days; prior everyday smoker H/o IVDU; denies any drug use now No alcohol use    Objective:  Physical Exam Temp(Src) 99 F (37.2 C) (Oral)  Wt 133 lb (60.328 kg) GEN: NAD CV: RRR, II/VI systolic murmur PULM: CTAB, normal effort ABD: full with liver palpated 3cm below border, soft, nontender EXTR: No LE edema SKIN: No rash or cyanosis HEENT: Mild temporal wasting     Assessment:     Whitney Patel is a 58 y.o. female with h/o recently diagnosed HCC, DM, and HTN here for dizziness/lightheadedness.    Plan:     Dizziness DDx includes orthostasis (orthostatic VS positive in clinic) with poor intake and taking antihypertensives regularly for the past week, anemia, malnutrition with cancer diagnosis, medication-induced, and metabolic derangement. Unlikely to be meniere's  or BPPV given no other symptoms and not a room spinning sensation. Afebrile with normal VS other than orthostasis. Pulse not increasing with decreasing BP likely due to beta blockade. - CMET, CBC - Decrease lisinopril to 10mg  BID (from 20mg  BID) and coreg to 6.25mg  BID (from 12.5mg  BID).  - Stay hydrated. - Return precautions reviewed. F/u 1-2 weeks if no improvement. - Precepted with Dr Mingo Amber  Follow-up: Follow up in 1-2 weeks for re-evaluation if dizziness not improved.   Whitney Sinclair, MD [place]

## 2013-09-24 NOTE — Patient Instructions (Signed)
Good to see you.  Decrease your lisinopril to 1/2 the dose twice daily and your coreg to 1/2 the dose twice daily. We are checking some labs today and I will call you if any are NOT normal. In the meantime, stay hydrated. If you have chest pain, trouble breathing, or other concerns, seek immediate care. Otherwise, follow up in 1-2 weeks if symptoms not improving.   Best,  Hilton Sinclair, MD

## 2013-09-25 ENCOUNTER — Other Ambulatory Visit (HOSPITAL_COMMUNITY): Payer: Self-pay | Admitting: Interventional Radiology

## 2013-09-25 ENCOUNTER — Telehealth: Payer: Self-pay | Admitting: Family Medicine

## 2013-09-25 DIAGNOSIS — C228 Malignant neoplasm of liver, primary, unspecified as to type: Secondary | ICD-10-CM

## 2013-09-25 DIAGNOSIS — Z720 Tobacco use: Secondary | ICD-10-CM

## 2013-09-25 DIAGNOSIS — I10 Essential (primary) hypertension: Secondary | ICD-10-CM

## 2013-09-25 DIAGNOSIS — T148XXA Other injury of unspecified body region, initial encounter: Secondary | ICD-10-CM

## 2013-09-25 DIAGNOSIS — E138 Other specified diabetes mellitus with unspecified complications: Secondary | ICD-10-CM

## 2013-09-25 DIAGNOSIS — R42 Dizziness and giddiness: Secondary | ICD-10-CM

## 2013-09-25 DIAGNOSIS — K769 Liver disease, unspecified: Secondary | ICD-10-CM

## 2013-09-25 DIAGNOSIS — K7030 Alcoholic cirrhosis of liver without ascites: Secondary | ICD-10-CM

## 2013-09-25 DIAGNOSIS — C220 Liver cell carcinoma: Secondary | ICD-10-CM

## 2013-09-25 DIAGNOSIS — B182 Chronic viral hepatitis C: Secondary | ICD-10-CM

## 2013-09-25 DIAGNOSIS — F1911 Other psychoactive substance abuse, in remission: Secondary | ICD-10-CM

## 2013-09-25 NOTE — Telephone Encounter (Signed)
LM for patient to call back.  Please inform of message from MD.  Thanks Whitney Patel

## 2013-09-25 NOTE — Telephone Encounter (Signed)
Please let Whitney Patel know her labs are all improving (better potassium and liver function tests) compared to when she was in the hospital. These do not explain her dizziness. However, her hemoglobin has dropped a little bit from 12.2 to 10.9. Please let her know that I would like her to make sure she is eating a healthy diet and 3 meals daily with some iron, and to come back in a week to repeat her CBC.  THanks,  Whitney Sinclair, MD

## 2013-09-25 NOTE — Telephone Encounter (Signed)
LMOVM for pt to return call .Fleeger, Jessica Dawn  

## 2013-10-03 ENCOUNTER — Telehealth: Payer: Self-pay | Admitting: Medical Oncology

## 2013-10-03 ENCOUNTER — Other Ambulatory Visit: Payer: Self-pay | Admitting: Medical Oncology

## 2013-10-03 MED ORDER — MEGESTROL ACETATE 625 MG/5ML PO SUSP: 625.0000 mg | Freq: Every day | ORAL | Status: DC

## 2013-10-03 NOTE — Telephone Encounter (Signed)
Please call her Megace please.

## 2013-10-03 NOTE — Telephone Encounter (Signed)
Patient called reporting a significant decrease in appetite since her surgery 08/25, states eats an average of once a day if she eats at all, reports to have lost 11 lbs since last week. Patient reports having nausea w/out emesis, was given prescription for phenergan but has not been taking d/t makes her drowsy. Patient asking about megace, states her "neighbor has cancer and has been taking megace and has helped him with his appetite, is this something I can get for me." Informed pt will review with MD and return call to her.   mssg routed to MD for review.  F/u appt 10/29 with MD LOV 07/02

## 2013-10-03 NOTE — Telephone Encounter (Signed)
Patient informed of prescription being sent to her listed pharmacy. Directions given on how to take. Patient gave verbal understanding, expressed thanks and knows to call office with further questions or concerns.

## 2013-10-08 ENCOUNTER — Encounter: Payer: Self-pay | Admitting: *Deleted

## 2013-10-08 ENCOUNTER — Telehealth: Payer: Self-pay | Admitting: *Deleted

## 2013-10-08 NOTE — Telephone Encounter (Signed)
Patient calling and is very upset, states her hair is falling out and she wants to know how long this will last. Gave her dr [facility] number [facility], who performed  Recent procedure on her.

## 2013-10-13 ENCOUNTER — Ambulatory Visit (INDEPENDENT_AMBULATORY_CARE_PROVIDER_SITE_OTHER): Payer: No Typology Code available for payment source | Admitting: Family Medicine

## 2013-10-13 ENCOUNTER — Encounter: Payer: Self-pay | Admitting: Family Medicine

## 2013-10-13 VITALS — BP 162/89 | HR 83 | Temp 98.3°F | Ht 65.0 in | Wt 137.5 lb

## 2013-10-13 DIAGNOSIS — C228 Malignant neoplasm of liver, primary, unspecified as to type: Secondary | ICD-10-CM

## 2013-10-13 DIAGNOSIS — R42 Dizziness and giddiness: Secondary | ICD-10-CM

## 2013-10-13 DIAGNOSIS — C220 Liver cell carcinoma: Secondary | ICD-10-CM

## 2013-10-13 NOTE — Patient Instructions (Signed)
It was a please seeing you today in our clinic. Today we discussed your current cancer and health status. Here is the treatment plan we have discussed and agreed upon together:  - continue current medication regimen. - I would like to see you again in 10-14 days to discuss your CT on 9/22

## 2013-10-13 NOTE — Assessment & Plan Note (Signed)
Patient states she has not been experiencing any further dizziness. She says that since her previous visit (9/2) she is been eating more and drinking more. She believes that this has significantly improved any of her dizziness and she has not had any further episodes.  It is my understanding that she also decreased her blood pressure medications (Corag and lisinopril) by half at her last visit on September 2 with Dr. Dianah Field. She could have had a positive response to this change in her medications. Today she is slightly hypertensive with blood pressure of 162/89. At her next visit she continues to be hypertensive I will gradually increase her hypertensive medications back to their original dose assuming no symptomatic changes are identified.

## 2013-10-13 NOTE — Progress Notes (Signed)
Patient ID: Whitney Patel, female   DOB: Mar 06, 1955, 58 y.o.   MRN: [id number]   HPI  Patient presents today for meeting new primary care provider. Patient has a recent diagnosis of hepatocellular carcinoma. She is currently being followed and treated by oncology. On 8/25 patient had a procedure with drug-eluting beads/transarterial embolization and right hepatic artery bland embolization. She states that she is doing much better however she is many questions about her prognosis. At this time I do not feel comfortable answering these questions because this was our first time meeting. Also, she did not have much time for this appointment and essentially had to leave after about 5 minutes of inquiries due to her husband waiting for her in the car. I asked consistently that she make a followup appointment in the next 10-14 days for [place] to go over her prognosis. At which time I will contact her oncologist in order to obtain more information on her treatments and prognosis.  ROS: Per HPI  Objective: BP 162/89  Pulse 83  Temp(Src) 98.3 F (36.8 C) (Oral)  Ht 5\' 5"  (1.651 m)  Wt 137 lb 8 oz (62.37 kg)  BMI 22.88 kg/m2 Gen: NAD, alert, cooperative with exam HEENT: NCAT, EOMI, PERRL CV: RRR, good S1/S2, no murmur Resp: CTABL, no wheezes, non-labored Abd: SNTND, BS present, no guarding or organomegaly Ext: No edema, warm Neuro: Alert and oriented, No gross deficits  Assessment and plan:  Dizziness and giddiness Patient states she has not been experiencing any further dizziness. She says that since her previous visit (9/2) she is been eating more and drinking more. She believes that this has significantly improved any of her dizziness and she has not had any further episodes.  It is my understanding that she also decreased her blood pressure medications (Corag and lisinopril) by half at her last visit on September 2 with Dr. Dianah Field. She could have had a positive response to this change in her  medications. Today she is slightly hypertensive with blood pressure of 162/89. At her next visit she continues to be hypertensive I will gradually increase her hypertensive medications back to their original dose assuming no symptomatic changes are identified.  [place] (hepatocellular carcinoma) Patient is currently being followed by oncology. She is s/p drug eluting beads/transarterial embolization and right hepatic artery bland embolization (8/25). Patient states that she has been doing well. She says she is been losing hair secondary to her chemotherapy, she is very interested in when or if this will stop. She had many questions for me today in the clinic. Specifically she was asking about her estimated life expectancy. She is also asking about how she may pass. However most important to this clinician was the fact the patient was asking whether or not she would die from this disease. When asked if she had discussed this with her oncologist patient said no.  During this visit I was not comfortable answering the majority of her questions. Patient was on a time constraint had to leave early. I asked that she reschedule an appointment with me in 10-14 days. Before this appointment I would like to contact her oncologist to discuss patient's overall prognosis, so I can have an accurate discussion with her.  Of note: patient does not have an advanced reactive in place.   No orders of the defined types were placed in this encounter.    No orders of the defined types were placed in this encounter.     Elberta Leatherwood, MD,MS,  PGY1 10/13/2013 4:19 PM

## 2013-10-13 NOTE — Assessment & Plan Note (Signed)
Patient is currently being followed by oncology. She is s/p drug eluting beads/transarterial embolization and right hepatic artery bland embolization (8/25). Patient states that she has been doing well. She says she is been losing hair secondary to her chemotherapy, she is very interested in when or if this will stop. She had many questions for me today in the clinic. Specifically she was asking about her estimated life expectancy. She is also asking about how she may pass. However most important to this clinician was the fact the patient was asking whether or not she would die from this disease. When asked if she had discussed this with her oncologist patient said no.  During this visit I was not comfortable answering the majority of her questions. Patient was on a time constraint had to leave early. I asked that she reschedule an appointment with me in 10-14 days. Before this appointment I would like to contact her oncologist to discuss patient's overall prognosis, so I can have an accurate discussion with her.  Of note: patient does not have an advanced reactive in place.

## 2013-10-14 ENCOUNTER — Ambulatory Visit (HOSPITAL_COMMUNITY): Payer: No Typology Code available for payment source

## 2013-10-15 ENCOUNTER — Inpatient Hospital Stay: Admission: RE | Admit: 2013-10-15 | Payer: No Typology Code available for payment source | Source: Ambulatory Visit

## 2013-10-23 ENCOUNTER — Ambulatory Visit: Payer: No Typology Code available for payment source | Admitting: Family Medicine

## 2013-10-28 ENCOUNTER — Encounter (HOSPITAL_COMMUNITY): Payer: Self-pay

## 2013-10-28 ENCOUNTER — Ambulatory Visit (HOSPITAL_COMMUNITY)
Admission: RE | Admit: 2013-10-28 | Discharge: 2013-10-28 | Disposition: A | Discharge status: Home / Self Care | Payer: No Typology Code available for payment source | Source: Ambulatory Visit | Attending: Interventional Radiology | Admitting: Interventional Radiology

## 2013-10-28 DIAGNOSIS — N200 Calculus of kidney: Secondary | ICD-10-CM | POA: Diagnosis not present

## 2013-10-28 DIAGNOSIS — Z87891 Personal history of nicotine dependence: Secondary | ICD-10-CM | POA: Insufficient documentation

## 2013-10-28 DIAGNOSIS — Z08 Encounter for follow-up examination after completed treatment for malignant neoplasm: Secondary | ICD-10-CM | POA: Diagnosis present

## 2013-10-28 DIAGNOSIS — N281 Cyst of kidney, acquired: Secondary | ICD-10-CM | POA: Insufficient documentation

## 2013-10-28 DIAGNOSIS — F1911 Other psychoactive substance abuse, in remission: Secondary | ICD-10-CM

## 2013-10-28 DIAGNOSIS — T148XXA Other injury of unspecified body region, initial encounter: Secondary | ICD-10-CM

## 2013-10-28 DIAGNOSIS — K7030 Alcoholic cirrhosis of liver without ascites: Secondary | ICD-10-CM | POA: Insufficient documentation

## 2013-10-28 DIAGNOSIS — C228 Malignant neoplasm of liver, primary, unspecified as to type: Secondary | ICD-10-CM

## 2013-10-28 DIAGNOSIS — C220 Liver cell carcinoma: Secondary | ICD-10-CM | POA: Diagnosis present

## 2013-10-28 DIAGNOSIS — E119 Type 2 diabetes mellitus without complications: Secondary | ICD-10-CM | POA: Insufficient documentation

## 2013-10-28 DIAGNOSIS — I700 Atherosclerosis of aorta: Secondary | ICD-10-CM | POA: Insufficient documentation

## 2013-10-28 DIAGNOSIS — I517 Cardiomegaly: Secondary | ICD-10-CM | POA: Diagnosis not present

## 2013-10-28 DIAGNOSIS — B182 Chronic viral hepatitis C: Secondary | ICD-10-CM | POA: Insufficient documentation

## 2013-10-28 DIAGNOSIS — E138 Other specified diabetes mellitus with unspecified complications: Secondary | ICD-10-CM

## 2013-10-28 DIAGNOSIS — Z720 Tobacco use: Secondary | ICD-10-CM

## 2013-10-28 DIAGNOSIS — K769 Liver disease, unspecified: Secondary | ICD-10-CM

## 2013-10-28 DIAGNOSIS — I10 Essential (primary) hypertension: Secondary | ICD-10-CM

## 2013-10-28 MED ORDER — IOHEXOL 300 MG/ML  SOLN
100.0000 mL | Freq: Once | INTRAMUSCULAR | Status: AC | PRN
  Administered 2013-10-28: 100 mL via INTRAVENOUS

## 2013-10-30 ENCOUNTER — Ambulatory Visit
Admission: RE | Admit: 2013-10-30 | Discharge: 2013-10-30 | Disposition: A | Discharge status: Home / Self Care | Payer: No Typology Code available for payment source | Source: Ambulatory Visit | Attending: Radiology | Admitting: Radiology

## 2013-10-30 DIAGNOSIS — K769 Liver disease, unspecified: Secondary | ICD-10-CM

## 2013-10-30 DIAGNOSIS — F1911 Other psychoactive substance abuse, in remission: Secondary | ICD-10-CM

## 2013-10-30 DIAGNOSIS — E138 Other specified diabetes mellitus with unspecified complications: Secondary | ICD-10-CM

## 2013-10-30 DIAGNOSIS — C220 Liver cell carcinoma: Secondary | ICD-10-CM

## 2013-10-30 DIAGNOSIS — B182 Chronic viral hepatitis C: Secondary | ICD-10-CM

## 2013-10-30 DIAGNOSIS — Z720 Tobacco use: Secondary | ICD-10-CM

## 2013-10-30 DIAGNOSIS — C228 Malignant neoplasm of liver, primary, unspecified as to type: Secondary | ICD-10-CM

## 2013-10-30 DIAGNOSIS — T148XXA Other injury of unspecified body region, initial encounter: Secondary | ICD-10-CM

## 2013-10-30 DIAGNOSIS — K7030 Alcoholic cirrhosis of liver without ascites: Secondary | ICD-10-CM

## 2013-10-30 DIAGNOSIS — I10 Essential (primary) hypertension: Secondary | ICD-10-CM

## 2013-10-30 NOTE — Progress Notes (Signed)
Patient ID: Whitney Patel, female   DOB: May 23, 1955, 58 y.o.   MRN: [id number]   Chief Complaint: multifocal HCC s/p bland and DEB/TACE 1 month ago Chief Complaint  Patient presents with  . Follow-up    follow up [facility]      Referring Physician(s): Shadad  History of Present Illness: Whitney Patel is a 58 y.o. 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 She is now one month status post combined bland embolization and DEB/TACE performed 09/16/2013. She has recovered at home very well. She describes a 3 day episode of post embolization syndrome at home with nausea, abdominal pain, and vomiting. Otherwise, no physical limitations. She is back to work full-time with a 4 hour work weeks. She returns for outpatient followup with a repeat posttreatment CT scan.  Past Medical History  Diagnosis Date  . Hypertension   . Hepatocellular carcinoma   . Diabetes mellitus     Past Surgical History  Procedure Laterality Date  . Cesarean section      Allergies: Review of patient's allergies indicates no known allergies.  Medications: Prior to Admission medications   Medication Sig Start Date End Date Taking? Authorizing Provider  amLODipine (NORVASC) 10 MG tablet Take 10 mg by mouth every morning.   Yes Historical Provider, MD  carvedilol (COREG) 12.5 MG tablet Take 0.5 tablets (6.25 mg total) by mouth 2 (two) times daily with a meal. 09/24/13  Yes Hilton Sinclair, MD  glucose blood test strip Please dispense glucose test strips for Accu Chek Aviva  to check Blood sugars two to three times daily . Also dispense lancets 05/22/11  Yes Shanda Howells, MD  hydrochlorothiazide (MICROZIDE) 12.5 MG capsule Take 12.5 mg by mouth 2 (two) times daily.   Yes Historical Provider, MD  lisinopril (PRINIVIL,ZESTRIL) 20 MG tablet Take 0.5 tablets (10 mg total) by mouth 2 (two) times daily. 09/24/13  Yes Hilton Sinclair, MD  megestrol (MEGACE ES) 625 MG/5ML suspension  Take 5 mLs (625 mg total) by mouth daily. 10/03/13  Yes Wyatt Portela, MD  promethazine (PHENERGAN) 25 MG tablet  09/17/13  Yes Historical Provider, MD    No family history on file.  History   Social History  . Marital Status: Legally Separated    Spouse Name: N/A    Number of Children: N/A  . Years of Education: N/A   Social History Main Topics  . Smoking status: Former Smoker -- 0.25 packs/day for 30 years    Types: Cigarettes    Start date: 10/21/1971  . Smokeless tobacco: Not on file     Comment: 1 pack per week  . Alcohol Use: Yes     Comment: occasion  . Drug Use: No  . Sexual Activity: Not on file   Other Topics Concern  . Not on file   Social History Narrative  . No narrative on file    ECOG Status:0 0 - Asymptomatic  Review of Systems: A 12 point ROS discussed and pertinent positives are indicated in the HPI above.  All other systems are negative.  Review of Systems  Constitutional: Negative for fever, activity change and appetite change.  HENT: Negative for congestion.   Respiratory: Negative for chest tightness and shortness of breath.   Cardiovascular: Negative for chest pain.  Gastrointestinal: Negative for abdominal distention.    Vital Signs: BP 179/92  Pulse 79  Temp(Src) 98.7 F (37.1 C) (Oral)  Resp 14  Ht 5\' 5"  (1.651 m)  Wt 137 lb (  62.143 kg)  BMI 22.80 kg/m2  SpO2 99%  Physical Exam  Constitutional: She appears well-developed and well-nourished. No distress.  Cardiovascular: Normal rate and regular rhythm.   Murmur heard. Pulmonary/Chest: Effort normal and breath sounds normal. No respiratory distress. She has no wheezes.  Abdominal: Soft. Bowel sounds are normal. She exhibits no distension.  Liver remains enlarged and firm to palpation below the subcostal margin  Skin: She is not diaphoretic.  Psychiatric: She has a normal mood and affect. Her behavior is normal. Judgment and thought content normal.    Imaging: Ct Abd Wo & W  Cm  10/28/2013   CLINICAL DATA:  Status post chemo embolization of hepatocellular carcinoma 1 month ago. Alcoholic cirrhosis. Hepatitis-C. Diabetes.  EXAM: CT ABDOMEN WITHOUT AND WITH CONTRAST  TECHNIQUE: Multidetector CT imaging of the abdomen was performed following the standard protocol before and following the bolus administration of intravenous contrast.  CONTRAST:  114mL OMNIPAQUE IOHEXOL 300 MG/ML  SOLN  COMPARISON:  08/11/2013.  MRI of 06/1913.  FINDINGS: Lower chest: Clear lung bases. Moderate cardiomegaly. Trace right pleural fluid or thickening.  Hepatobiliary: Moderate cirrhosis. Marked hepatomegaly. Nearly 23 cm.  The arterial phase images demonstrate heterogeneous enhancement throughout the liver, greater on the right.  The most direct comparison of liver mass burden is series 5, portal venous phase today versus series 6 of the 08/11/2013 exam.  -Index anterior segment right liver lobe lesion measures 4.2 x 3.4 cm on image 46 today versus 4.3 x 4.0 cm on the prior exam (when remeasured). Less well-defined today.  -Index posterior segment right liver lobe lesion measures 5.3 x 4.6 cm on image 54 of series 5 today versus 5.7 x 5.4 cm on the prior. Greater necrosis today.  -Immediately inferior posterior segment right liver lobe lesion measures 6.1 x 5.2 cm on image 65 today versus 6.2 x 5.0 cm on the prior. This suggests size stability with greater central necrosis identified today.  -In the anterior segment right liver lobe index lesion measures 5.2 x 4.9 cm on image 60 of series 5. Compare 4.5 x 4.3 cm on image 33 of series 6 in the prior exam (when remeasured). Suggests slight interval enlargement.  Tumor extends into the medial segment of the left lobe of the liver, similar. Foci of arterial phase hyper enhancement within the lateral segment left liver lobe, including on image 32 of series 3 are nonspecific. Subcapsular subcentimeter left hepatic lobe cyst.  Normal gallbladder, without biliary  ductal dilatation.  Pancreas: Normal, without mass or pancreatic ductal dilatation.  Spleen: Normal  Adrenals/Urinary Tract: Normal adrenal glands. Upper pole right renal cyst. Upper pole right renal calculus, without hydronephrosis. Normal left kidney.  Stomach/Bowel: Underdistended proximal stomach. Normal abdominal bowel loops.  Vascular/Lymphatic: Aortic and branch vessel atherosclerosis. Patent hepatic and portal veins. No splenic artery aneurysm. Circumaortic left renal vein. No retroperitoneal or retrocrural adenopathy. Porta hepatis node measures 1.7 cm on image 51 of series 5. This is favored to be reactive and is not significantly changed.  Other:  No ascites.  Musculoskeletal: Convex left lumbar spine curvature.  IMPRESSION: 1. Overall mild response to therapy of multi focal hepatocellular carcinoma, primarily within the right lobe of the liver. When directly comparing today's portal venous phase study to that of 08/11/2013, the majority of lesions are similar to slightly smaller with increased necrosis. One lesion, involving the anterior segment right lobe of the liver, demonstrates apparent mild enlargement. 2. No evidence of extrahepatic disease/metastasis. 3. Cirrhosis and marked hepatomegaly.  4. Age advanced atherosclerosis. 5. The anterior segment right portal vein thrombus detailed on the prior CT is not apparent today. 6. Right nephrolithiasis.   Electronically Signed   By: Abigail Miyamoto M.D.   On: 10/28/2013 16:13    Labs:  CBC:  Recent Labs  08/29/13 0750 09/16/13 0721 09/17/13 0555 09/24/13 1632  WBC 6.8 6.3 9.0 7.7  HGB 11.0* 12.0 12.2 10.9*  HCT 32.6* 35.7* 35.0* 31.8*  PLT 230 208 228 426*    COAGS:  Recent Labs  06/11/13 1008 07/21/13 1238 08/25/13 1111 09/16/13 0721  INR 1.05 0.99 0.97 1.06  APTT  --   --  29 30    BMP:  Recent Labs  08/25/13 1111 08/29/13 0750 09/16/13 0721 09/17/13 0555 09/24/13 1632  NA 143 143 143 137 138  K 3.8 3.8 3.8 3.2* 3.9   CL 103 104 103 95* 100  CO2 27 26 28 25 25   GLUCOSE 110* 103* 116* 84 95  BUN 10 9 7 10 12   CALCIUM 9.4 9.3 9.1 9.3 8.9  CREATININE 0.66 0.66 0.50 0.55 0.79  GFRNONAA >90 >90 >90 >90  --   GFRAA >90 >90 >90 >90  --     LIVER FUNCTION TESTS:  Recent Labs  08/29/13 0750 09/16/13 0721 09/17/13 0555 09/24/13 1632  BILITOT 0.7 0.5 0.6 0.6  AST 78* 103* 178* 45*  ALT 72* 78* 105* 40*  ALKPHOS 150* 181* 178* 139*  PROT 7.9 7.8 7.9 7.6  ALBUMIN 3.9 3.7 3.6 3.9    TUMOR MARKERS:  Recent Labs  07/24/13 1318  AFPTM 9.2*    Assessment and Plan:  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 One month status post bland embolization and DEB/TACE.  She had a mild post embolization syndrome for 3 days posttreatment. She remains very functional and is back to work full-time. Imaging shows some response with less enhancement of the multifocal tumor burden and areas of central necrosis. No biliary dilatation. Patent portal vein. Liver functions remain stable. She is a candidate for a second treatment and this was discussed at length with the patient. She would like to proceed with a second DEB/TACE. This will be scheduled the week of November 2 at [facility]..    I spent a total of 30 minutes face to face in clinical consultation, greater than 50% of which was counseling/coordinating care for hepatocellular carcinoma.  SignedGreggory Keen T. 10/30/2013, 9:36 AM

## 2013-10-30 NOTE — Progress Notes (Addendum)
Patient reports that appetite is good, weight stable.  Occasional nausea.  Denies vomiting.  C/o constipation.  Denies pain associated with procedure.      Sleeping:  Fair-good.  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     [place], RN 10/30/2013 8:02 AM

## 2013-10-31 ENCOUNTER — Other Ambulatory Visit: Payer: Self-pay | Admitting: Interventional Radiology

## 2013-10-31 DIAGNOSIS — C220 Liver cell carcinoma: Secondary | ICD-10-CM

## 2013-11-05 ENCOUNTER — Other Ambulatory Visit (HOSPITAL_COMMUNITY): Payer: Self-pay | Admitting: Interventional Radiology

## 2013-11-05 DIAGNOSIS — C228 Malignant neoplasm of liver, primary, unspecified as to type: Secondary | ICD-10-CM

## 2013-11-05 DIAGNOSIS — Z720 Tobacco use: Secondary | ICD-10-CM

## 2013-11-05 DIAGNOSIS — T148XXA Other injury of unspecified body region, initial encounter: Secondary | ICD-10-CM

## 2013-11-05 DIAGNOSIS — K7030 Alcoholic cirrhosis of liver without ascites: Secondary | ICD-10-CM

## 2013-11-05 DIAGNOSIS — K769 Liver disease, unspecified: Secondary | ICD-10-CM

## 2013-11-05 DIAGNOSIS — B182 Chronic viral hepatitis C: Secondary | ICD-10-CM

## 2013-11-05 DIAGNOSIS — I10 Essential (primary) hypertension: Secondary | ICD-10-CM

## 2013-11-05 DIAGNOSIS — E138 Other specified diabetes mellitus with unspecified complications: Secondary | ICD-10-CM

## 2013-11-05 DIAGNOSIS — F1911 Other psychoactive substance abuse, in remission: Secondary | ICD-10-CM

## 2013-11-05 DIAGNOSIS — C220 Liver cell carcinoma: Secondary | ICD-10-CM

## 2013-11-06 ENCOUNTER — Ambulatory Visit
Admission: RE | Admit: 2013-11-06 | Discharge: 2013-11-06 | Disposition: A | Discharge status: Home / Self Care | Payer: No Typology Code available for payment source | Source: Ambulatory Visit | Attending: Interventional Radiology | Admitting: Interventional Radiology

## 2013-11-06 DIAGNOSIS — F1911 Other psychoactive substance abuse, in remission: Secondary | ICD-10-CM

## 2013-11-06 DIAGNOSIS — K7030 Alcoholic cirrhosis of liver without ascites: Secondary | ICD-10-CM

## 2013-11-06 DIAGNOSIS — Z720 Tobacco use: Secondary | ICD-10-CM

## 2013-11-06 DIAGNOSIS — I10 Essential (primary) hypertension: Secondary | ICD-10-CM

## 2013-11-06 DIAGNOSIS — E138 Other specified diabetes mellitus with unspecified complications: Secondary | ICD-10-CM

## 2013-11-06 DIAGNOSIS — K769 Liver disease, unspecified: Secondary | ICD-10-CM

## 2013-11-06 DIAGNOSIS — C228 Malignant neoplasm of liver, primary, unspecified as to type: Secondary | ICD-10-CM

## 2013-11-06 DIAGNOSIS — B182 Chronic viral hepatitis C: Secondary | ICD-10-CM

## 2013-11-06 DIAGNOSIS — T148XXA Other injury of unspecified body region, initial encounter: Secondary | ICD-10-CM

## 2013-11-06 DIAGNOSIS — C220 Liver cell carcinoma: Secondary | ICD-10-CM

## 2013-11-06 NOTE — Progress Notes (Signed)
Patient ID: Whitney Patel, female   DOB: [birth year], 58 y.o.   MRN: [id number]   Chief Complaint: 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 One month status post bland and DEB/TACE. She returns to review her CT imaging and discuss again a second embolization treatment.   Referring Physician(s): Shadad   History of Present Illness: [name] is a 58 y.o. 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 She is one month status post combined bland embolization and DEB/TACE performed 09/16/2013. She has recovered from the treatment. She did have a 3 day episode of post embolization syndrome at home with nausea, abdominal pain vomiting. Otherwise she is back to work full-time. No physical limitations. No fevers. She returns to discuss and review her CT imaging and additional treatment.  Past Medical History  Diagnosis Date  . Hypertension   . Hepatocellular carcinoma   . Diabetes mellitus     Past Surgical History  Procedure Laterality Date  . Cesarean section      Allergies: Review of patient's allergies indicates no known allergies.  Medications: Prior to Admission medications   Medication Sig Start Date End Date Taking? Authorizing Provider  amLODipine (NORVASC) 10 MG tablet Take 10 mg by mouth every morning.    Historical Provider, MD  carvedilol (COREG) 12.5 MG tablet Take 0.5 tablets (6.25 mg total) by mouth 2 (two) times daily with a meal. 09/24/13   Hilton Sinclair, MD  glucose blood test strip Please dispense glucose test strips for Accu Chek Aviva  to check Blood sugars two to three times daily . Also dispense lancets 05/22/11   Shanda Howells, MD  hydrochlorothiazide (MICROZIDE) 12.5 MG capsule Take 12.5 mg by mouth 2 (two) times daily.    Historical Provider, MD  lisinopril (PRINIVIL,ZESTRIL) 20 MG tablet Take 0.5 tablets (10 mg total) by mouth 2 (two) times daily. 09/24/13   Hilton Sinclair, MD    megestrol (MEGACE ES) 625 MG/5ML suspension Take 5 mLs (625 mg total) by mouth daily. 10/03/13   Wyatt Portela, MD  promethazine (PHENERGAN) 25 MG tablet  09/17/13   Historical Provider, MD    No family history on file.  History   Social History  . Marital Status: Legally Separated    Spouse Name: N/A    Number of Children: N/A  . Years of Education: N/A   Social History Main Topics  . Smoking status: Former Smoker -- 0.25 packs/day for 30 years    Types: Cigarettes    Start date: 10/21/1971  . Smokeless tobacco: Not on file     Comment: 1 pack per week  . Alcohol Use: Yes     Comment: occasion  . Drug Use: No  . Sexual Activity: Not on file   Other Topics Concern  . Not on file   Social History Narrative  . No narrative on file    ECOG Status: 0 - Asymptomatic  Review of Systems: A 12 point ROS discussed and pertinent positives are indicated in the HPI above.  All other systems are negative.  Review of Systems  Constitutional: Negative for fever, activity change and appetite change.  Respiratory: Positive for shortness of breath. Negative for chest tightness and wheezing.   Cardiovascular: Negative for chest pain and palpitations.  Gastrointestinal: Negative for abdominal distention.       Enlarged liver below the rt subcostal margin  Psychiatric/Behavioral: Negative for behavioral problems, confusion and agitation.    Vital Signs: BP 159/71  Pulse 81  Temp(Src) 97.3 F (36.3 C) (Oral)  Resp 13  Ht 5\' 5"  (1.651 m)  Wt 135 lb (61.236 kg)  BMI 22.47 kg/m2  SpO2 100%  Physical Exam  Constitutional: She appears well-developed and well-nourished. No distress.  Abdominal: Soft. Bowel sounds are normal. She exhibits no distension.  Palpable enlarged liver along the right subcostal margin  Skin: She is not diaphoretic.  Psychiatric: She has a normal mood and affect. Her behavior is normal.    Imaging: Ct Abd Wo & W Cm  10/28/2013   CLINICAL DATA:  Status  post chemo embolization of hepatocellular carcinoma 1 month ago. Alcoholic cirrhosis. Hepatitis-C. Diabetes.  EXAM: CT ABDOMEN WITHOUT AND WITH CONTRAST  TECHNIQUE: Multidetector CT imaging of the abdomen was performed following the standard protocol before and following the bolus administration of intravenous contrast.  CONTRAST:  168mL OMNIPAQUE IOHEXOL 300 MG/ML  SOLN  COMPARISON:  08/11/2013.  MRI of 06/1913.  FINDINGS: Lower chest: Clear lung bases. Moderate cardiomegaly. Trace right pleural fluid or thickening.  Hepatobiliary: Moderate cirrhosis. Marked hepatomegaly. Nearly 23 cm.  The arterial phase images demonstrate heterogeneous enhancement throughout the liver, greater on the right.  The most direct comparison of liver mass burden is series 5, portal venous phase today versus series 6 of the 08/11/2013 exam.  -Index anterior segment right liver lobe lesion measures 4.2 x 3.4 cm on image 46 today versus 4.3 x 4.0 cm on the prior exam (when remeasured). Less well-defined today.  -Index posterior segment right liver lobe lesion measures 5.3 x 4.6 cm on image 54 of series 5 today versus 5.7 x 5.4 cm on the prior. Greater necrosis today.  -Immediately inferior posterior segment right liver lobe lesion measures 6.1 x 5.2 cm on image 65 today versus 6.2 x 5.0 cm on the prior. This suggests size stability with greater central necrosis identified today.  -In the anterior segment right liver lobe index lesion measures 5.2 x 4.9 cm on image 60 of series 5. Compare 4.5 x 4.3 cm on image 33 of series 6 in the prior exam (when remeasured). Suggests slight interval enlargement.  Tumor extends into the medial segment of the left lobe of the liver, similar. Foci of arterial phase hyper enhancement within the lateral segment left liver lobe, including on image 32 of series 3 are nonspecific. Subcapsular subcentimeter left hepatic lobe cyst.  Normal gallbladder, without biliary ductal dilatation.  Pancreas: Normal, without  mass or pancreatic ductal dilatation.  Spleen: Normal  Adrenals/Urinary Tract: Normal adrenal glands. Upper pole right renal cyst. Upper pole right renal calculus, without hydronephrosis. Normal left kidney.  Stomach/Bowel: Underdistended proximal stomach. Normal abdominal bowel loops.  Vascular/Lymphatic: Aortic and branch vessel atherosclerosis. Patent hepatic and portal veins. No splenic artery aneurysm. Circumaortic left renal vein. No retroperitoneal or retrocrural adenopathy. Porta hepatis node measures 1.7 cm on image 51 of series 5. This is favored to be reactive and is not significantly changed.  Other:  No ascites.  Musculoskeletal: Convex left lumbar spine curvature.  IMPRESSION: 1. Overall mild response to therapy of multi focal hepatocellular carcinoma, primarily within the right lobe of the liver. When directly comparing today's portal venous phase study to that of 08/11/2013, the majority of lesions are similar to slightly smaller with increased necrosis. One lesion, involving the anterior segment right lobe of the liver, demonstrates apparent mild enlargement. 2. No evidence of extrahepatic disease/metastasis. 3. Cirrhosis and marked hepatomegaly. 4. Age advanced atherosclerosis. 5. The anterior segment right portal vein  thrombus detailed on the prior CT is not apparent today. 6. Right nephrolithiasis.   Electronically Signed   By: Abigail Miyamoto M.D.   On: 10/28/2013 16:13    Labs:  CBC:  Recent Labs  08/29/13 0750 09/16/13 0721 09/17/13 0555 09/24/13 1632  WBC 6.8 6.3 9.0 7.7  HGB 11.0* 12.0 12.2 10.9*  HCT 32.6* 35.7* 35.0* 31.8*  PLT 230 208 228 426*    COAGS:  Recent Labs  06/11/13 1008 07/21/13 1238 08/25/13 1111 09/16/13 0721  INR 1.05 0.99 0.97 1.06  APTT  --   --  29 30    BMP:  Recent Labs  08/25/13 1111 08/29/13 0750 09/16/13 0721 09/17/13 0555 09/24/13 1632  NA 143 143 143 137 138  K 3.8 3.8 3.8 3.2* 3.9  CL 103 104 103 95* 100  CO2 27 26 28 25 25     GLUCOSE 110* 103* 116* 84 95  BUN 10 9 7 10 12   CALCIUM 9.4 9.3 9.1 9.3 8.9  CREATININE 0.66 0.66 0.50 0.55 0.79  GFRNONAA >90 >90 >90 >90  --   GFRAA >90 >90 >90 >90  --     LIVER FUNCTION TESTS:  Recent Labs  08/29/13 0750 09/16/13 0721 09/17/13 0555 09/24/13 1632  BILITOT 0.7 0.5 0.6 0.6  AST 78* 103* 178* 45*  ALT 72* 78* 105* 40*  ALKPHOS 150* 181* 178* 139*  PROT 7.9 7.8 7.9 7.6  ALBUMIN 3.9 3.7 3.6 3.9    TUMOR MARKERS:  Recent Labs  07/24/13 1318  AFPTM 9.2*    Assessment and Plan:  Multifocal hepatocellular carcinoma secondary to cirrhosis and hepatitis C. One month status post combined bland and DEB/TACE embolization. Imaging shows some response with less enhancement of the multifocal tumor burden predominantly in the right hepatic lobe with areas of central necrosis. No biliary dilatation. Portal vein remains patent. Liver functions are stable. She remains a candidate for second treatment which was again discussed at length with the patient. Imaging was reviewed. She has a clear understanding of the embolization treatment and does wish to proceed with a second treatment. Again, this has been scheduled the week of November 2 at [facility].    I spent a total of 20 minutes face to face in clinical consultation, greater than 50% of which was counseling/coordinating care for this patient with hepatocellular carcinoma.  SignedGreggory Keen T. 11/06/2013, 1:45 PM

## 2013-11-17 ENCOUNTER — Other Ambulatory Visit: Payer: Self-pay | Admitting: Family Medicine

## 2013-11-17 NOTE — Telephone Encounter (Signed)
Pt needs more refills on hctz,  goes to [facility]

## 2013-11-18 ENCOUNTER — Other Ambulatory Visit: Payer: Self-pay | Admitting: Interventional Radiology

## 2013-11-18 MED ORDER — HYDROCHLOROTHIAZIDE 12.5 MG PO CAPS: 12.5000 mg | ORAL_CAPSULE | Freq: Two times a day (BID) | ORAL | Status: DC

## 2013-11-18 MED ORDER — DOXORUBICIN HCL 50 MG IV SOLR
150.0000 mg | Freq: Once | INTRAVENOUS | Status: AC
  Administered 2013-11-25: 150 mg via INTRA_ARTERIAL
  Filled 2013-11-18: qty 150

## 2013-11-20 ENCOUNTER — Encounter (HOSPITAL_COMMUNITY): Payer: Self-pay | Admitting: Pharmacy Technician

## 2013-11-20 ENCOUNTER — Telehealth: Payer: Self-pay | Admitting: Oncology

## 2013-11-20 ENCOUNTER — Ambulatory Visit (HOSPITAL_BASED_OUTPATIENT_CLINIC_OR_DEPARTMENT_OTHER): Payer: No Typology Code available for payment source | Admitting: Oncology

## 2013-11-20 VITALS — BP 139/70 | HR 87 | Temp 99.6°F | Resp 20 | Ht 65.0 in | Wt 130.2 lb

## 2013-11-20 DIAGNOSIS — C220 Liver cell carcinoma: Secondary | ICD-10-CM

## 2013-11-20 DIAGNOSIS — R630 Anorexia: Secondary | ICD-10-CM

## 2013-11-20 DIAGNOSIS — C228 Malignant neoplasm of liver, primary, unspecified as to type: Secondary | ICD-10-CM

## 2013-11-20 NOTE — Telephone Encounter (Signed)
LM to confirm Jan 2016 appt d/t. Mailed cal.

## 2013-11-20 NOTE — Progress Notes (Signed)
Hematology and Oncology Follow Up Visit  Whitney Patel [id number] [birth year] 58 y.o. 11/20/2013 3:39 PM Whitney Patel, MDWilliamson, Marena Chancy, MD   Principle Diagnosis: 58 year old woman with hepatocellular carcinoma diagnosed in June 2015. She had a mass measuring 5.0 x 5.3 x 5.5 cm. Her initial staging was stage IIIA. Alpha-fetoprotein is 9.2 this was biopsy-proven on 08/25/2013.   Prior Therapy: Right lobe hepatocellular carcinoma was treated with Drug Eluting Beads / TransArterial Embolization after Rt hepatic artery bland embolization 8/25 with Dr Annamaria Boots in IR.   Current therapy: Observation and surveillance.  Interim History:  Whitney Patel presents today for a follow-up visit. Since her last visit, she underwent liver directed therapy as mentioned and tolerated it well. She did have nausea and vomiting for 4 days but have resolved at this time. She continues to eat better at this time since she was started on Megace. She continues to work full time despite her illness. She has no new complaints. She has not reported any headaches or blurry vision or double vision. Has not reported any syncope or encephalopathy. She does not report any fevers or chills or sweats or weight loss. Does not report any past or symptoms. She did not report any chest pain orthopnea PND or palpitation. Does not report any cough or hemoptysis or wheezing. Does not report any nausea, vomiting, abdominal pain, distention, hematochezia, melena or change in her bowel habits. He really had any ascites or early satiety. She does not report any frequency urgency or hesitancy. Did not report any skeletal complaints. Does not report any arthralgias or myalgias. She did have some ecchymoses noted on her arm but no easy bruisability or epistaxis. Rest of her review of systems unremarkable.     Medications: I have reviewed the patient's current medications.  Current Outpatient Prescriptions  Medication Sig Dispense Refill  .  amLODipine (NORVASC) 10 MG tablet Take 10 mg by mouth every morning.      . carvedilol (COREG) 12.5 MG tablet Take 6.25 mg by mouth 2 (two) times daily with a meal.      . hydrochlorothiazide (MICROZIDE) 12.5 MG capsule Take 1 capsule (12.5 mg total) by mouth 2 (two) times daily.  60 capsule  3  . lisinopril (PRINIVIL,ZESTRIL) 20 MG tablet Take 20 mg by mouth 2 (two) times daily.      . megestrol (MEGACE ES) 625 MG/5ML suspension Take 5 mLs (625 mg total) by mouth daily.  150 mL  0  . promethazine (PHENERGAN) 25 MG tablet Take 25 mg by mouth every 6 (six) hours as needed for nausea.        No current facility-administered medications for this visit.   Facility-Administered Medications Ordered in Other Visits  Medication Dose Route Frequency Provider Last Rate Last Dose  . [START ON 11/25/2013] DOXOrubicin (ADRIAMYCIN) chemo injection 150 mg  150 mg Intra-arterial Once [name], MD         Allergies: No Known Allergies  Past Medical History, Surgical history, Social history, and Family History were reviewed and updated.  Physical Exam: Blood pressure 139/70, pulse 87, temperature 99.6 F (37.6 C), temperature source Oral, resp. rate 20, height 5\' 5"  (1.651 m), weight 130 lb 3.2 oz (59.058 kg). ECOG: 1 General appearance: alert and cooperative Head: Normocephalic, without obvious abnormality Neck: no adenopathy Lymph nodes: Cervical, supraclavicular, and axillary nodes normal. Heart:regular rate and rhythm, S1, S2 normal, no murmur, click, rub or gallop Lung:chest clear, no wheezing, rales, normal symmetric air entry. Abdomin: soft,  non-tender, without masses or organomegaly. Slightly distended without rebound or guarding. EXT:no erythema, induration, or nodules   Lab Results: Lab Results  Component Value Date   WBC 7.7 09/24/2013   HGB 10.9* 09/24/2013   HCT 31.8* 09/24/2013   MCV 87.6 09/24/2013   PLT 426* 09/24/2013     Chemistry      Component Value Date/Time   NA 138  09/24/2013 1632   NA 143 07/24/2013 1318   K 3.9 09/24/2013 1632   K 4.0 07/24/2013 1318   CL 100 09/24/2013 1632   CO2 25 09/24/2013 1632   CO2 28 07/24/2013 1318   BUN 12 09/24/2013 1632   BUN 9.4 07/24/2013 1318   CREATININE 0.79 09/24/2013 1632   CREATININE 0.55 09/17/2013 0555   CREATININE 0.7 07/24/2013 1318      Component Value Date/Time   CALCIUM 8.9 09/24/2013 1632   CALCIUM 9.2 07/24/2013 1318   ALKPHOS 139* 09/24/2013 1632   ALKPHOS 153* 07/24/2013 1318   AST 45* 09/24/2013 1632   AST 65* 07/24/2013 1318   ALT 40* 09/24/2013 1632   ALT 68* 07/24/2013 1318   BILITOT 0.6 09/24/2013 1632   BILITOT 0.54 07/24/2013 1318       Radiological Studies: 1. Overall mild response to therapy of multi focal hepatocellular  carcinoma, primarily within the right lobe of the liver. When  directly comparing today's portal venous phase study to that of  08/11/2013, the majority of lesions are similar to slightly smaller  with increased necrosis. One lesion, involving the anterior segment  right lobe of the liver, demonstrates apparent mild enlargement.  2. No evidence of extrahepatic disease/metastasis.  3. Cirrhosis and marked hepatomegaly.  4. Age advanced atherosclerosis.  5. The anterior segment right portal vein thrombus detailed on the  prior CT is not apparent today.  6. Right nephrolithiasis.    Impression and Plan:  58 year old woman with the following issues:  1. 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 She is status post liver directed therapy and a follow-up CT scan in 10/28/2013 showed response to therapy as outlined above. She is doing well clinically with liver function tests actually improving at this time. She is currently under evaluation for a second treatment and tentatively scheduled on 11/24/2013 under the care of Dr. Annamaria Boots. I will continue to follow her clinically with a follow-up in 3 months.  2. Anorexia: Seems  to be improved on Megace.   Zola Button, MD 10/29/20153:39 PM

## 2013-11-21 ENCOUNTER — Other Ambulatory Visit: Payer: Self-pay | Admitting: Radiology

## 2013-11-24 ENCOUNTER — Other Ambulatory Visit: Payer: Self-pay | Admitting: Radiology

## 2013-11-25 ENCOUNTER — Telehealth: Payer: Self-pay | Admitting: *Deleted

## 2013-11-25 ENCOUNTER — Encounter (HOSPITAL_COMMUNITY): Payer: Self-pay

## 2013-11-25 ENCOUNTER — Ambulatory Visit (HOSPITAL_COMMUNITY)
Admission: RE | Admit: 2013-11-25 | Discharge: 2013-11-25 | Disposition: A | Discharge status: Home / Self Care | Payer: No Typology Code available for payment source | Source: Ambulatory Visit | Attending: Interventional Radiology | Admitting: Interventional Radiology

## 2013-11-25 ENCOUNTER — Inpatient Hospital Stay (HOSPITAL_COMMUNITY)
Admission: RE | Admit: 2013-11-25 | Discharge: 2013-12-02 | DRG: 356 | Disposition: A | Discharge status: Home Health | Payer: No Typology Code available for payment source | Source: Ambulatory Visit | Attending: Internal Medicine | Admitting: Internal Medicine

## 2013-11-25 VITALS — BP 105/55 | HR 66 | Temp 99.1°F | Resp 20 | Ht 65.0 in | Wt 130.0 lb

## 2013-11-25 DIAGNOSIS — E875 Hyperkalemia: Secondary | ICD-10-CM | POA: Diagnosis not present

## 2013-11-25 DIAGNOSIS — K3189 Other diseases of stomach and duodenum: Secondary | ICD-10-CM | POA: Diagnosis present

## 2013-11-25 DIAGNOSIS — E43 Unspecified severe protein-calorie malnutrition: Secondary | ICD-10-CM | POA: Diagnosis present

## 2013-11-25 DIAGNOSIS — J189 Pneumonia, unspecified organism: Secondary | ICD-10-CM | POA: Diagnosis not present

## 2013-11-25 DIAGNOSIS — J9601 Acute respiratory failure with hypoxia: Secondary | ICD-10-CM | POA: Diagnosis not present

## 2013-11-25 DIAGNOSIS — F1911 Other psychoactive substance abuse, in remission: Secondary | ICD-10-CM

## 2013-11-25 DIAGNOSIS — R5381 Other malaise: Secondary | ICD-10-CM | POA: Diagnosis present

## 2013-11-25 DIAGNOSIS — Z720 Tobacco use: Secondary | ICD-10-CM

## 2013-11-25 DIAGNOSIS — Z6821 Body mass index (BMI) 21.0-21.9, adult: Secondary | ICD-10-CM

## 2013-11-25 DIAGNOSIS — R42 Dizziness and giddiness: Secondary | ICD-10-CM

## 2013-11-25 DIAGNOSIS — K7460 Unspecified cirrhosis of liver: Secondary | ICD-10-CM | POA: Diagnosis present

## 2013-11-25 DIAGNOSIS — R0602 Shortness of breath: Secondary | ICD-10-CM

## 2013-11-25 DIAGNOSIS — D62 Acute posthemorrhagic anemia: Secondary | ICD-10-CM | POA: Diagnosis present

## 2013-11-25 DIAGNOSIS — E119 Type 2 diabetes mellitus without complications: Secondary | ICD-10-CM

## 2013-11-25 DIAGNOSIS — C220 Liver cell carcinoma: Secondary | ICD-10-CM | POA: Diagnosis present

## 2013-11-25 DIAGNOSIS — K7030 Alcoholic cirrhosis of liver without ascites: Secondary | ICD-10-CM

## 2013-11-25 DIAGNOSIS — I10 Essential (primary) hypertension: Secondary | ICD-10-CM

## 2013-11-25 DIAGNOSIS — B182 Chronic viral hepatitis C: Secondary | ICD-10-CM

## 2013-11-25 DIAGNOSIS — Z79899 Other long term (current) drug therapy: Secondary | ICD-10-CM

## 2013-11-25 DIAGNOSIS — I272 Other secondary pulmonary hypertension: Secondary | ICD-10-CM | POA: Diagnosis present

## 2013-11-25 DIAGNOSIS — K769 Liver disease, unspecified: Secondary | ICD-10-CM

## 2013-11-25 DIAGNOSIS — R918 Other nonspecific abnormal finding of lung field: Secondary | ICD-10-CM | POA: Clinically undetermined

## 2013-11-25 DIAGNOSIS — T148XXA Other injury of unspecified body region, initial encounter: Secondary | ICD-10-CM

## 2013-11-25 DIAGNOSIS — K920 Hematemesis: Principal | ICD-10-CM

## 2013-11-25 DIAGNOSIS — K766 Portal hypertension: Secondary | ICD-10-CM | POA: Diagnosis present

## 2013-11-25 DIAGNOSIS — I864 Gastric varices: Secondary | ICD-10-CM

## 2013-11-25 DIAGNOSIS — R0902 Hypoxemia: Secondary | ICD-10-CM

## 2013-11-25 DIAGNOSIS — F17210 Nicotine dependence, cigarettes, uncomplicated: Secondary | ICD-10-CM | POA: Diagnosis present

## 2013-11-25 HISTORY — DX: Unspecified viral hepatitis C without hepatic coma: B19.20

## 2013-11-25 LAB — CBC WITH DIFFERENTIAL/PLATELET
Basophils Absolute: 0 10*3/uL (ref 0.0–0.1)
Basophils Relative: 0 % (ref 0–1)
EOS ABS: 0.1 10*3/uL (ref 0.0–0.7)
EOS PCT: 1 % (ref 0–5)
HCT: 31.9 % — ABNORMAL LOW (ref 36.0–46.0)
HEMOGLOBIN: 10.4 g/dL — AB (ref 12.0–15.0)
Lymphocytes Relative: 24 % (ref 12–46)
Lymphs Abs: 1.9 10*3/uL (ref 0.7–4.0)
MCH: 29.4 pg (ref 26.0–34.0)
MCHC: 32.6 g/dL (ref 30.0–36.0)
MCV: 90.1 fL (ref 78.0–100.0)
MONOS PCT: 8 % (ref 3–12)
Monocytes Absolute: 0.6 10*3/uL (ref 0.1–1.0)
NEUTROS PCT: 67 % (ref 43–77)
Neutro Abs: 5.4 10*3/uL (ref 1.7–7.7)
Platelets: 242 10*3/uL (ref 150–400)
RBC: 3.54 MIL/uL — ABNORMAL LOW (ref 3.87–5.11)
RDW: 16.7 % — ABNORMAL HIGH (ref 11.5–15.5)
WBC: 8.1 10*3/uL (ref 4.0–10.5)

## 2013-11-25 LAB — GLUCOSE, CAPILLARY: GLUCOSE-CAPILLARY: 137 mg/dL — AB (ref 70–99)

## 2013-11-25 LAB — APTT: aPTT: 24 seconds (ref 24–37)

## 2013-11-25 LAB — COMPREHENSIVE METABOLIC PANEL
ALBUMIN: 3.2 g/dL — AB (ref 3.5–5.2)
ALT: 63 U/L — ABNORMAL HIGH (ref 0–35)
AST: 108 U/L — ABNORMAL HIGH (ref 0–37)
Alkaline Phosphatase: 192 U/L — ABNORMAL HIGH (ref 39–117)
Anion gap: 14 (ref 5–15)
BUN: 11 mg/dL (ref 6–23)
CALCIUM: 9 mg/dL (ref 8.4–10.5)
CO2: 24 mEq/L (ref 19–32)
CREATININE: 0.72 mg/dL (ref 0.50–1.10)
Chloride: 101 mEq/L (ref 96–112)
GFR calc non Af Amer: >90 mL/min (ref >90)
GLUCOSE: 142 mg/dL — AB (ref 70–99)
Potassium: 4.3 mEq/L (ref 3.7–5.3)
Sodium: 139 mEq/L (ref 137–147)
TOTAL PROTEIN: 7.7 g/dL (ref 6.0–8.3)
Total Bilirubin: 1.2 mg/dL (ref 0.3–1.2)

## 2013-11-25 LAB — PROTIME-INR
INR: 1.12 (ref 0.00–1.49)
PROTHROMBIN TIME: 14.5 s (ref 11.6–15.2)

## 2013-11-25 MED ORDER — AMLODIPINE BESYLATE 10 MG PO TABS
10.0000 mg | ORAL_TABLET | Freq: Every morning | ORAL | Status: DC
  Administered 2013-11-26 – 2013-12-02 (×7): 10 mg via ORAL
  Filled 2013-11-25 (×7): qty 1

## 2013-11-25 MED ORDER — DOCUSATE SODIUM 100 MG PO CAPS
100.0000 mg | ORAL_CAPSULE | Freq: Two times a day (BID) | ORAL | Status: DC
Start: 2013-11-25 — End: 2013-12-02
  Administered 2013-11-25 – 2013-11-29 (×8): 100 mg via ORAL
  Filled 2013-11-25 (×20): qty 1

## 2013-11-25 MED ORDER — PROMETHAZINE HCL 25 MG PO TABS
25.0000 mg | ORAL_TABLET | Freq: Three times a day (TID) | ORAL | Status: DC | PRN
  Administered 2013-11-25: 25 mg via ORAL
  Filled 2013-11-25: qty 1

## 2013-11-25 MED ORDER — SODIUM CHLORIDE 0.9 % IJ SOLN: 3.0000 mL | INTRAMUSCULAR | Status: DC | PRN

## 2013-11-25 MED ORDER — FENTANYL CITRATE 0.05 MG/ML IJ SOLN
INTRAMUSCULAR | Status: DC | PRN
  Administered 2013-11-25: 25 ug via INTRAVENOUS
  Administered 2013-11-25 (×3): 50 ug via INTRAVENOUS
  Administered 2013-11-25: 25 ug via INTRAVENOUS

## 2013-11-25 MED ORDER — ONDANSETRON HCL 4 MG/2ML IJ SOLN
4.0000 mg | Freq: Four times a day (QID) | INTRAMUSCULAR | Status: DC | PRN
  Administered 2013-11-25: 4 mg via INTRAVENOUS
  Filled 2013-11-25: qty 2

## 2013-11-25 MED ORDER — MIDAZOLAM HCL 2 MG/2ML IJ SOLN
INTRAMUSCULAR | Status: DC | PRN
  Administered 2013-11-25: 1 mg via INTRAVENOUS
  Administered 2013-11-25: 0.5 mg via INTRAVENOUS
  Administered 2013-11-25 (×2): 1 mg via INTRAVENOUS
  Administered 2013-11-25: 0.5 mg via INTRAVENOUS

## 2013-11-25 MED ORDER — IOHEXOL 300 MG/ML  SOLN
80.0000 mL | Freq: Once | INTRAMUSCULAR | Status: AC | PRN
  Administered 2013-11-25: 1 mL via INTRA_ARTERIAL

## 2013-11-25 MED ORDER — FENTANYL 10 MCG/ML IV SOLN
INTRAVENOUS | Status: DC
  Administered 2013-11-25: 170 ug via INTRAVENOUS
  Administered 2013-11-25: 13 mL via INTRAVENOUS
  Administered 2013-11-25: 30 ug via INTRAVENOUS
  Administered 2013-11-25: 13:00:00 via INTRAVENOUS
  Administered 2013-11-26: 10 ug via INTRAVENOUS
  Administered 2013-11-26: 0 ug via INTRAVENOUS
  Administered 2013-11-26: 10 ug via INTRAVENOUS
  Filled 2013-11-25: qty 50

## 2013-11-25 MED ORDER — DIPHENHYDRAMINE HCL 12.5 MG/5ML PO ELIX
12.5000 mg | ORAL_SOLUTION | Freq: Four times a day (QID) | ORAL | Status: DC | PRN
Start: 2013-11-25 — End: 2013-11-26

## 2013-11-25 MED ORDER — SODIUM CHLORIDE 0.9 % IJ SOLN: 9.0000 mL | INTRAMUSCULAR | Status: DC | PRN

## 2013-11-25 MED ORDER — HYDROCHLOROTHIAZIDE 12.5 MG PO CAPS
12.5000 mg | ORAL_CAPSULE | Freq: Two times a day (BID) | ORAL | Status: DC
  Administered 2013-11-26: 12.5 mg via ORAL
  Filled 2013-11-25 (×3): qty 1

## 2013-11-25 MED ORDER — PROMETHAZINE HCL 25 MG RE SUPP: 25.0000 mg | Freq: Three times a day (TID) | RECTAL | Status: DC | PRN

## 2013-11-25 MED ORDER — FENTANYL CITRATE 0.05 MG/ML IJ SOLN
INTRAMUSCULAR | Status: AC
  Filled 2013-11-25: qty 6

## 2013-11-25 MED ORDER — SODIUM CHLORIDE 0.9 % IV SOLN: 250.0000 mL | INTRAVENOUS | Status: DC | PRN

## 2013-11-25 MED ORDER — LIDOCAINE HCL 1 % IJ SOLN
INTRAMUSCULAR | Status: AC
  Filled 2013-11-25: qty 20

## 2013-11-25 MED ORDER — SODIUM CHLORIDE 0.9 % IV SOLN
80.0000 mg | Freq: Once | INTRAVENOUS | Status: AC
  Administered 2013-11-25: 80 mg via INTRAVENOUS
  Filled 2013-11-25: qty 80

## 2013-11-25 MED ORDER — MIDAZOLAM HCL 2 MG/2ML IJ SOLN
INTRAMUSCULAR | Status: AC
  Filled 2013-11-25: qty 6

## 2013-11-25 MED ORDER — ONDANSETRON HCL 4 MG/2ML IJ SOLN
4.0000 mg | Freq: Once | INTRAMUSCULAR | Status: AC
  Administered 2013-11-25: 4 mg via INTRAVENOUS
  Filled 2013-11-25: qty 2

## 2013-11-25 MED ORDER — DEXAMETHASONE SODIUM PHOSPHATE 10 MG/ML IJ SOLN
10.0000 mg | Freq: Once | INTRAMUSCULAR | Status: AC
  Administered 2013-11-25: 10 mg via INTRAVENOUS
  Filled 2013-11-25: qty 1

## 2013-11-25 MED ORDER — NALOXONE HCL 0.4 MG/ML IJ SOLN: 0.4000 mg | INTRAMUSCULAR | Status: DC | PRN

## 2013-11-25 MED ORDER — ONDANSETRON HCL 4 MG/2ML IJ SOLN
4.0000 mg | Freq: Four times a day (QID) | INTRAMUSCULAR | Status: DC | PRN
  Administered 2013-11-27: 4 mg via INTRAVENOUS
  Filled 2013-11-25 (×2): qty 2

## 2013-11-25 MED ORDER — HYDROCODONE-ACETAMINOPHEN 5-325 MG PO TABS
1.0000 | ORAL_TABLET | ORAL | Status: DC | PRN
  Administered 2013-11-25 (×2): 2 via ORAL
  Administered 2013-11-27 – 2013-11-29 (×3): 1 via ORAL
  Administered 2013-11-29 – 2013-12-01 (×5): 2 via ORAL
  Filled 2013-11-25 (×2): qty 2
  Filled 2013-11-25 (×2): qty 1
  Filled 2013-11-25: qty 2
  Filled 2013-11-25: qty 1
  Filled 2013-11-25 (×6): qty 2

## 2013-11-25 MED ORDER — SODIUM CHLORIDE 0.9 % IJ SOLN
3.0000 mL | Freq: Two times a day (BID) | INTRAMUSCULAR | Status: DC
  Administered 2013-11-27 – 2013-12-02 (×6): 3 mL via INTRAVENOUS

## 2013-11-25 MED ORDER — SODIUM CHLORIDE 0.9 % IV SOLN
INTRAVENOUS | Status: DC
  Administered 2013-11-25: 08:00:00 via INTRAVENOUS
  Administered 2013-11-25: 1000 mL via INTRAVENOUS
  Administered 2013-11-27: 02:00:00 via INTRAVENOUS

## 2013-11-25 MED ORDER — DIPHENHYDRAMINE HCL 50 MG/ML IJ SOLN: 12.5000 mg | Freq: Four times a day (QID) | INTRAMUSCULAR | Status: DC | PRN

## 2013-11-25 MED ORDER — PIPERACILLIN-TAZOBACTAM 3.375 G IVPB
3.3750 g | Freq: Once | INTRAVENOUS | Status: AC
  Administered 2013-11-25: 3.375 g via INTRAVENOUS
  Filled 2013-11-25: qty 50

## 2013-11-25 MED ORDER — CARVEDILOL 6.25 MG PO TABS
6.2500 mg | ORAL_TABLET | Freq: Two times a day (BID) | ORAL | Status: DC
  Administered 2013-11-26 – 2013-12-02 (×13): 6.25 mg via ORAL
  Filled 2013-11-25 (×17): qty 1

## 2013-11-25 MED ORDER — LISINOPRIL 20 MG PO TABS
20.0000 mg | ORAL_TABLET | Freq: Two times a day (BID) | ORAL | Status: DC
  Administered 2013-11-26: 20 mg via ORAL
  Filled 2013-11-25 (×3): qty 1

## 2013-11-25 NOTE — Telephone Encounter (Signed)
-----   Message from Elberta Leatherwood, MD sent at 11/18/2013  8:24 AM EDT ----- Regarding: appointment Please let patient know I'd like to see her in clinic. Our EXTREMELY short visit last time was barely worthy of an actual visit.   Thank you!

## 2013-11-25 NOTE — Progress Notes (Signed)
Day of Surgery  Subjective: Pt a little drowsy but alert; c/o mid back discomfort (chronic); denies sig /worsening abd pain,N/V at present; has had small amt liquids  Objective: Vital signs in last 24 hours: Temp:  [97.7 F (36.5 C)-98.5 F (36.9 C)] 97.7 F (36.5 C) (11/03 1310) Pulse Rate:  [64-74] 72 (11/03 1604) Resp:  [14-26] 14 (11/03 1604) BP: (98-141)/(42-83) 140/78 mmHg (11/03 1604) SpO2:  [91 %-100 %] 99 % (11/03 1604) Weight:  [130 lb (58.968 kg)] 130 lb (58.968 kg) (11/03 1230)    Intake/Output from previous day:   Intake/Output this shift:    Pt awake, drowsy, answers questions appropriately; abd dist,+BS, hepatomegaly, minimal upper abd tenderness; rt CFA puncture site clean and dry, NT, no hematoma  Lab Results:   Recent Labs  11/25/13 0800  WBC 8.1  HGB 10.4*  HCT 31.9*  PLT 242   BMET  Recent Labs  11/25/13 0800  NA 139  K 4.3  CL 101  CO2 24  GLUCOSE 142*  BUN 11  CREATININE 0.72  CALCIUM 9.0   PT/INR  Recent Labs  11/25/13 0800  LABPROT 14.5  INR 1.12   ABG No results for input(s): PHART, HCO3 in the last 72 hours.  Invalid input(s): PCO2, PO2  Studies/Results: Ir Angiogram Visceral Selective  11/25/2013   CLINICAL DATA:  Multifocal diffuse hepatocellular carcinoma  EXAM: ULTRASOUND GUIDANCE FOR VASCULAR ACCESS  CELIAC ANGIOGRAM  COMMON HEPATIC, PROPER HEPATIC, RIGHT AND LEFT HEPATIC ANGIOGRAMS  RIGHT AND LEFT HEPATIC ARTERY BLAND EMBOLIZATIONS  RIGHT HEPATIC ARTERY DRUG-ELUTING BEADS TRANS ARTERIAL CHEMO EMBOLIZATION  Date:  11/3/201511/03/2013 11:52 am  Radiologist:  M. Daryll Brod, MD  Guidance:  Ultrasound and fluoroscopic  FLUOROSCOPY TIME:  12 min 36 seconds, 2,853 mGy  MEDICATIONS AND MEDICAL HISTORY: 4 mg versed, 200 mcg fentanyl, 20 mg Decadron, 4 mg Zofran, 3.375 g Zosyn administered within 1 hr of the procedure  ANESTHESIA/SEDATION: 1 hr 12 min  CONTRAST:  140 cc Omnipaque [id number]  COMPLICATIONS: None immediate  PROCEDURE:  Informed consent was obtained from the patient following explanation of the procedure, risks, benefits and alternatives. The patient understands, agrees and consents for the procedure. All questions were addressed. A time out was performed.  Maximal barrier sterile technique utilized including caps, mask, sterile gowns, sterile gloves, large sterile drape, hand hygiene, and Betadine.  Under sterile conditions and local anesthesia, ultrasound micropuncture access was performed of the right common femoral artery. Five French sheath inserted over a Bentson guidewire. C2 catheter utilized to select the celiac origin. Selective celiac angiogram performed.  Celiac angiogram: Celiac origin is widely patent. Hepatic, gastroduodenal, and splenic vasculature are patent. Arterial portal venous shunting demonstrated through the diffuse hepatic tumor vascularity.  Micro catheter was utilized to select the common hepatic artery selective common hepatic angiogram performed. This again demonstrates patency of the right and left hepatic arteries. Diffuse hepatic tumor vascularity demonstrated with early arterial portal venous shunting.  Micro catheter utilized to select the right hepatic artery. Selective right hepatic angiogram also confirms arterial portal venous shunting with diffuse tumor vascularity.  Right hepatic artery Bland embolization: From this location, right hepatic artery bland embolization was performed with 1 vial of 700- 900 micron embospheres. This is in preparation for chemo embolization to decrease the arterial portal venous shunting prior to delivering drug-eluting beads. Following right hepatic artery bland embolization, there is significant reduction in the arterial portal venous shunting.  Next, the micro catheter was utilized to select the left hepatic  artery. Selective left hepatic angiogram and bland embolization performed.  Left hepatic bland embolization: Left hepatic artery is widely patent with  diffuse arterial tumor vascularity and portal venous shunting into the left portal vein. Bland embolization performed with 1 vial of 700 - 900 micron embospheres to decrease the arterial portal venous shunting.  Micro catheter was utilized to re- select the right hepatic artery. Repeat right hepatic arteriogram confirms position with very little residual arterial portal venous shunting.  Drug-eluting beads trans arterial chemo embolization: From this location, 150 mg of doxorubicin drug-eluting beads were delivered predominately into the right hepatic artery. Minimal reflux into the left hepatic artery. Entire dose was able to be delivered. Post embolization angiographic confirms preserved patency of right hepatic artery with antegrade flow.  Minor catheter and C2 catheter were removed. Right common femoral artery access site was closed with a 5 [place] Star[employer] device. No immediate complication. Overall, patient tolerated the procedure well. She will be admitted for overnight observation.  IMPRESSION: Successful right and left hepatic artery bland embolizations with 700- 900 micron embospheres to decrease arterial portal venous shunting through the tumor vascularity.  Successful drug-eluting beads trans arterial chemo embolization predominantly to the right hepatic tumor vascularity, total dose administered 150 mg doxorubicin.   Electronically Signed   By: Daryll Brod M.D.   On: 11/25/2013 13:19   Ir Angiogram Selective Each Additional Vessel  11/25/2013   CLINICAL DATA:  Multifocal diffuse hepatocellular carcinoma  EXAM: ULTRASOUND GUIDANCE FOR VASCULAR ACCESS  CELIAC ANGIOGRAM  COMMON HEPATIC, PROPER HEPATIC, RIGHT AND LEFT HEPATIC ANGIOGRAMS  RIGHT AND LEFT HEPATIC ARTERY BLAND EMBOLIZATIONS  RIGHT HEPATIC ARTERY DRUG-ELUTING BEADS TRANS ARTERIAL CHEMO EMBOLIZATION  Date:  11/3/201511/03/2013 11:52 am  Radiologist:  M. Daryll Brod, MD  Guidance:  Ultrasound and fluoroscopic  FLUOROSCOPY TIME:  12 min 36  seconds, 2,853 mGy  MEDICATIONS AND MEDICAL HISTORY: 4 mg versed, 200 mcg fentanyl, 20 mg Decadron, 4 mg Zofran, 3.375 g Zosyn administered within 1 hr of the procedure  ANESTHESIA/SEDATION: 1 hr 12 min  CONTRAST:  140 cc Omnipaque [id number]  COMPLICATIONS: None immediate  PROCEDURE: Informed consent was obtained from the patient following explanation of the procedure, risks, benefits and alternatives. The patient understands, agrees and consents for the procedure. All questions were addressed. A time out was performed.  Maximal barrier sterile technique utilized including caps, mask, sterile gowns, sterile gloves, large sterile drape, hand hygiene, and Betadine.  Under sterile conditions and local anesthesia, ultrasound micropuncture access was performed of the right common femoral artery. Five French sheath inserted over a Bentson guidewire. C2 catheter utilized to select the celiac origin. Selective celiac angiogram performed.  Celiac angiogram: Celiac origin is widely patent. Hepatic, gastroduodenal, and splenic vasculature are patent. Arterial portal venous shunting demonstrated through the diffuse hepatic tumor vascularity.  Micro catheter was utilized to select the common hepatic artery selective common hepatic angiogram performed. This again demonstrates patency of the right and left hepatic arteries. Diffuse hepatic tumor vascularity demonstrated with early arterial portal venous shunting.  Micro catheter utilized to select the right hepatic artery. Selective right hepatic angiogram also confirms arterial portal venous shunting with diffuse tumor vascularity.  Right hepatic artery Bland embolization: From this location, right hepatic artery bland embolization was performed with 1 vial of 700- 900 micron embospheres. This is in preparation for chemo embolization to decrease the arterial portal venous shunting prior to delivering drug-eluting beads. Following right hepatic artery bland embolization, there is  significant reduction in the  arterial portal venous shunting.  Next, the micro catheter was utilized to select the left hepatic artery. Selective left hepatic angiogram and bland embolization performed.  Left hepatic bland embolization: Left hepatic artery is widely patent with diffuse arterial tumor vascularity and portal venous shunting into the left portal vein. Bland embolization performed with 1 vial of 700 - 900 micron embospheres to decrease the arterial portal venous shunting.  Micro catheter was utilized to re- select the right hepatic artery. Repeat right hepatic arteriogram confirms position with very little residual arterial portal venous shunting.  Drug-eluting beads trans arterial chemo embolization: From this location, 150 mg of doxorubicin drug-eluting beads were delivered predominately into the right hepatic artery. Minimal reflux into the left hepatic artery. Entire dose was able to be delivered. Post embolization angiographic confirms preserved patency of right hepatic artery with antegrade flow.  Minor catheter and C2 catheter were removed. Right common femoral artery access site was closed with a 5 [place] [employer] device. No immediate complication. Overall, patient tolerated the procedure well. She will be admitted for overnight observation.  IMPRESSION: Successful right and left hepatic artery bland embolizations with 700- 900 micron embospheres to decrease arterial portal venous shunting through the tumor vascularity.  Successful drug-eluting beads trans arterial chemo embolization predominantly to the right hepatic tumor vascularity, total dose administered 150 mg doxorubicin.   Electronically Signed   By: Daryll Brod M.D.   On: 11/25/2013 13:19   Ir Angiogram Selective Each Additional Vessel  11/25/2013   CLINICAL DATA:  Multifocal diffuse hepatocellular carcinoma  EXAM: ULTRASOUND GUIDANCE FOR VASCULAR ACCESS  CELIAC ANGIOGRAM  COMMON HEPATIC, PROPER HEPATIC, RIGHT AND LEFT HEPATIC  ANGIOGRAMS  RIGHT AND LEFT HEPATIC ARTERY BLAND EMBOLIZATIONS  RIGHT HEPATIC ARTERY DRUG-ELUTING BEADS TRANS ARTERIAL CHEMO EMBOLIZATION  Date:  11/3/201511/03/2013 11:52 am  Radiologist:  M. Daryll Brod, MD  Guidance:  Ultrasound and fluoroscopic  FLUOROSCOPY TIME:  12 min 36 seconds, 2,853 mGy  MEDICATIONS AND MEDICAL HISTORY: 4 mg versed, 200 mcg fentanyl, 20 mg Decadron, 4 mg Zofran, 3.375 g Zosyn administered within 1 hr of the procedure  ANESTHESIA/SEDATION: 1 hr 12 min  CONTRAST:  140 cc Omnipaque [id number]  COMPLICATIONS: None immediate  PROCEDURE: Informed consent was obtained from the patient following explanation of the procedure, risks, benefits and alternatives. The patient understands, agrees and consents for the procedure. All questions were addressed. A time out was performed.  Maximal barrier sterile technique utilized including caps, mask, sterile gowns, sterile gloves, large sterile drape, hand hygiene, and Betadine.  Under sterile conditions and local anesthesia, ultrasound micropuncture access was performed of the right common femoral artery. Five French sheath inserted over a Bentson guidewire. C2 catheter utilized to select the celiac origin. Selective celiac angiogram performed.  Celiac angiogram: Celiac origin is widely patent. Hepatic, gastroduodenal, and splenic vasculature are patent. Arterial portal venous shunting demonstrated through the diffuse hepatic tumor vascularity.  Micro catheter was utilized to select the common hepatic artery selective common hepatic angiogram performed. This again demonstrates patency of the right and left hepatic arteries. Diffuse hepatic tumor vascularity demonstrated with early arterial portal venous shunting.  Micro catheter utilized to select the right hepatic artery. Selective right hepatic angiogram also confirms arterial portal venous shunting with diffuse tumor vascularity.  Right hepatic artery Bland embolization: From this location, right hepatic artery  bland embolization was performed with 1 vial of 700- 900 micron embospheres. This is in preparation for chemo embolization to decrease the arterial portal venous shunting prior to  delivering drug-eluting beads. Following right hepatic artery bland embolization, there is significant reduction in the arterial portal venous shunting.  Next, the micro catheter was utilized to select the left hepatic artery. Selective left hepatic angiogram and bland embolization performed.  Left hepatic bland embolization: Left hepatic artery is widely patent with diffuse arterial tumor vascularity and portal venous shunting into the left portal vein. Bland embolization performed with 1 vial of 700 - 900 micron embospheres to decrease the arterial portal venous shunting.  Micro catheter was utilized to re- select the right hepatic artery. Repeat right hepatic arteriogram confirms position with very little residual arterial portal venous shunting.  Drug-eluting beads trans arterial chemo embolization: From this location, 150 mg of doxorubicin drug-eluting beads were delivered predominately into the right hepatic artery. Minimal reflux into the left hepatic artery. Entire dose was able to be delivered. Post embolization angiographic confirms preserved patency of right hepatic artery with antegrade flow.  Minor catheter and C2 catheter were removed. Right common femoral artery access site was closed with a 5 [place] [employer] device. No immediate complication. Overall, patient tolerated the procedure well. She will be admitted for overnight observation.  IMPRESSION: Successful right and left hepatic artery bland embolizations with 700- 900 micron embospheres to decrease arterial portal venous shunting through the tumor vascularity.  Successful drug-eluting beads trans arterial chemo embolization predominantly to the right hepatic tumor vascularity, total dose administered 150 mg doxorubicin.   Electronically Signed   By: Daryll Brod M.D.    On: 11/25/2013 13:19   Ir Angiogram Selective Each Additional Vessel  11/25/2013   CLINICAL DATA:  Multifocal diffuse hepatocellular carcinoma  EXAM: ULTRASOUND GUIDANCE FOR VASCULAR ACCESS  CELIAC ANGIOGRAM  COMMON HEPATIC, PROPER HEPATIC, RIGHT AND LEFT HEPATIC ANGIOGRAMS  RIGHT AND LEFT HEPATIC ARTERY BLAND EMBOLIZATIONS  RIGHT HEPATIC ARTERY DRUG-ELUTING BEADS TRANS ARTERIAL CHEMO EMBOLIZATION  Date:  11/3/201511/03/2013 11:52 am  Radiologist:  M. Daryll Brod, MD  Guidance:  Ultrasound and fluoroscopic  FLUOROSCOPY TIME:  12 min 36 seconds, 2,853 mGy  MEDICATIONS AND MEDICAL HISTORY: 4 mg versed, 200 mcg fentanyl, 20 mg Decadron, 4 mg Zofran, 3.375 g Zosyn administered within 1 hr of the procedure  ANESTHESIA/SEDATION: 1 hr 12 min  CONTRAST:  140 cc Omnipaque [id number]  COMPLICATIONS: None immediate  PROCEDURE: Informed consent was obtained from the patient following explanation of the procedure, risks, benefits and alternatives. The patient understands, agrees and consents for the procedure. All questions were addressed. A time out was performed.  Maximal barrier sterile technique utilized including caps, mask, sterile gowns, sterile gloves, large sterile drape, hand hygiene, and Betadine.  Under sterile conditions and local anesthesia, ultrasound micropuncture access was performed of the right common femoral artery. Five French sheath inserted over a Bentson guidewire. C2 catheter utilized to select the celiac origin. Selective celiac angiogram performed.  Celiac angiogram: Celiac origin is widely patent. Hepatic, gastroduodenal, and splenic vasculature are patent. Arterial portal venous shunting demonstrated through the diffuse hepatic tumor vascularity.  Micro catheter was utilized to select the common hepatic artery selective common hepatic angiogram performed. This again demonstrates patency of the right and left hepatic arteries. Diffuse hepatic tumor vascularity demonstrated with early arterial portal  venous shunting.  Micro catheter utilized to select the right hepatic artery. Selective right hepatic angiogram also confirms arterial portal venous shunting with diffuse tumor vascularity.  Right hepatic artery Bland embolization: From this location, right hepatic artery bland embolization was performed with 1 vial of 700- 900 micron embospheres. This  is in preparation for chemo embolization to decrease the arterial portal venous shunting prior to delivering drug-eluting beads. Following right hepatic artery bland embolization, there is significant reduction in the arterial portal venous shunting.  Next, the micro catheter was utilized to select the left hepatic artery. Selective left hepatic angiogram and bland embolization performed.  Left hepatic bland embolization: Left hepatic artery is widely patent with diffuse arterial tumor vascularity and portal venous shunting into the left portal vein. Bland embolization performed with 1 vial of 700 - 900 micron embospheres to decrease the arterial portal venous shunting.  Micro catheter was utilized to re- select the right hepatic artery. Repeat right hepatic arteriogram confirms position with very little residual arterial portal venous shunting.  Drug-eluting beads trans arterial chemo embolization: From this location, 150 mg of doxorubicin drug-eluting beads were delivered predominately into the right hepatic artery. Minimal reflux into the left hepatic artery. Entire dose was able to be delivered. Post embolization angiographic confirms preserved patency of right hepatic artery with antegrade flow.  Minor catheter and C2 catheter were removed. Right common femoral artery access site was closed with a 5 [place] [employer] device. No immediate complication. Overall, patient tolerated the procedure well. She will be admitted for overnight observation.  IMPRESSION: Successful right and left hepatic artery bland embolizations with 700- 900 micron embospheres to decrease  arterial portal venous shunting through the tumor vascularity.  Successful drug-eluting beads trans arterial chemo embolization predominantly to the right hepatic tumor vascularity, total dose administered 150 mg doxorubicin.   Electronically Signed   By: Daryll Brod M.D.   On: 11/25/2013 13:19   Ir US Guide Vasc Access Right  11/25/2013   CLINICAL DATA:  Multifocal diffuse hepatocellular carcinoma  EXAM: ULTRASOUND GUIDANCE FOR VASCULAR ACCESS  CELIAC ANGIOGRAM  COMMON HEPATIC, PROPER HEPATIC, RIGHT AND LEFT HEPATIC ANGIOGRAMS  RIGHT AND LEFT HEPATIC ARTERY BLAND EMBOLIZATIONS  RIGHT HEPATIC ARTERY DRUG-ELUTING BEADS TRANS ARTERIAL CHEMO EMBOLIZATION  Date:  11/3/201511/03/2013 11:52 am  Radiologist:  M. Daryll Brod, MD  Guidance:  Ultrasound and fluoroscopic  FLUOROSCOPY TIME:  12 min 36 seconds, 2,853 mGy  MEDICATIONS AND MEDICAL HISTORY: 4 mg versed, 200 mcg fentanyl, 20 mg Decadron, 4 mg Zofran, 3.375 g Zosyn administered within 1 hr of the procedure  ANESTHESIA/SEDATION: 1 hr 12 min  CONTRAST:  140 cc Omnipaque [id number]  COMPLICATIONS: None immediate  PROCEDURE: Informed consent was obtained from the patient following explanation of the procedure, risks, benefits and alternatives. The patient understands, agrees and consents for the procedure. All questions were addressed. A time out was performed.  Maximal barrier sterile technique utilized including caps, mask, sterile gowns, sterile gloves, large sterile drape, hand hygiene, and Betadine.  Under sterile conditions and local anesthesia, ultrasound micropuncture access was performed of the right common femoral artery. Five French sheath inserted over a Bentson guidewire. C2 catheter utilized to select the celiac origin. Selective celiac angiogram performed.  Celiac angiogram: Celiac origin is widely patent. Hepatic, gastroduodenal, and splenic vasculature are patent. Arterial portal venous shunting demonstrated through the diffuse hepatic tumor vascularity.   Micro catheter was utilized to select the common hepatic artery selective common hepatic angiogram performed. This again demonstrates patency of the right and left hepatic arteries. Diffuse hepatic tumor vascularity demonstrated with early arterial portal venous shunting.  Micro catheter utilized to select the right hepatic artery. Selective right hepatic angiogram also confirms arterial portal venous shunting with diffuse tumor vascularity.  Right hepatic artery Bland embolization: From this location, right  hepatic artery bland embolization was performed with 1 vial of 700- 900 micron embospheres. This is in preparation for chemo embolization to decrease the arterial portal venous shunting prior to delivering drug-eluting beads. Following right hepatic artery bland embolization, there is significant reduction in the arterial portal venous shunting.  Next, the micro catheter was utilized to select the left hepatic artery. Selective left hepatic angiogram and bland embolization performed.  Left hepatic bland embolization: Left hepatic artery is widely patent with diffuse arterial tumor vascularity and portal venous shunting into the left portal vein. Bland embolization performed with 1 vial of 700 - 900 micron embospheres to decrease the arterial portal venous shunting.  Micro catheter was utilized to re- select the right hepatic artery. Repeat right hepatic arteriogram confirms position with very little residual arterial portal venous shunting.  Drug-eluting beads trans arterial chemo embolization: From this location, 150 mg of doxorubicin drug-eluting beads were delivered predominately into the right hepatic artery. Minimal reflux into the left hepatic artery. Entire dose was able to be delivered. Post embolization angiographic confirms preserved patency of right hepatic artery with antegrade flow.  Minor catheter and C2 catheter were removed. Right common femoral artery access site was closed with a 5 [place]  Star[employer] device. No immediate complication. Overall, patient tolerated the procedure well. She will be admitted for overnight observation.  IMPRESSION: Successful right and left hepatic artery bland embolizations with 700- 900 micron embospheres to decrease arterial portal venous shunting through the tumor vascularity.  Successful drug-eluting beads trans arterial chemo embolization predominantly to the right hepatic tumor vascularity, total dose administered 150 mg doxorubicin.   Electronically Signed   By: Daryll Brod M.D.   On: 11/25/2013 13:19   Ir Embo Tumor Organ Ischemia Infarct Inc Guide Roadmapping  11/25/2013   CLINICAL DATA:  Multifocal diffuse hepatocellular carcinoma  EXAM: ULTRASOUND GUIDANCE FOR VASCULAR ACCESS  CELIAC ANGIOGRAM  COMMON HEPATIC, PROPER HEPATIC, RIGHT AND LEFT HEPATIC ANGIOGRAMS  RIGHT AND LEFT HEPATIC ARTERY BLAND EMBOLIZATIONS  RIGHT HEPATIC ARTERY DRUG-ELUTING BEADS TRANS ARTERIAL CHEMO EMBOLIZATION  Date:  11/3/201511/03/2013 11:52 am  Radiologist:  M. Daryll Brod, MD  Guidance:  Ultrasound and fluoroscopic  FLUOROSCOPY TIME:  12 min 36 seconds, 2,853 mGy  MEDICATIONS AND MEDICAL HISTORY: 4 mg versed, 200 mcg fentanyl, 20 mg Decadron, 4 mg Zofran, 3.375 g Zosyn administered within 1 hr of the procedure  ANESTHESIA/SEDATION: 1 hr 12 min  CONTRAST:  140 cc Omnipaque [id number]  COMPLICATIONS: None immediate  PROCEDURE: Informed consent was obtained from the patient following explanation of the procedure, risks, benefits and alternatives. The patient understands, agrees and consents for the procedure. All questions were addressed. A time out was performed.  Maximal barrier sterile technique utilized including caps, mask, sterile gowns, sterile gloves, large sterile drape, hand hygiene, and Betadine.  Under sterile conditions and local anesthesia, ultrasound micropuncture access was performed of the right common femoral artery. Five French sheath inserted over a Bentson guidewire. C2  catheter utilized to select the celiac origin. Selective celiac angiogram performed.  Celiac angiogram: Celiac origin is widely patent. Hepatic, gastroduodenal, and splenic vasculature are patent. Arterial portal venous shunting demonstrated through the diffuse hepatic tumor vascularity.  Micro catheter was utilized to select the common hepatic artery selective common hepatic angiogram performed. This again demonstrates patency of the right and left hepatic arteries. Diffuse hepatic tumor vascularity demonstrated with early arterial portal venous shunting.  Micro catheter utilized to select the right hepatic artery. 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  Right hepatic artery Bland embolization: From this location, right hepatic artery bland embolization was performed with 1 vial of 700- 900 micron embospheres. This is in preparation for chemo embolization to decrease the arterial portal venous shunting prior to delivering drug-eluting beads. Following right hepatic artery bland embolization, there is significant reduction in the arterial portal venous shunting.  Next, the micro catheter was utilized to select the left hepatic artery. Selective left hepatic angiogram and bland embolization performed.  Left hepatic bland embolization: Left hepatic artery is widely patent with diffuse arterial tumor vascularity and portal venous shunting into the left portal vein. Bland embolization performed with 1 vial of 700 - 900 micron embospheres to decrease the arterial portal venous shunting.  Micro catheter was utilized to re- select the right hepatic artery. Repeat right hepatic arteriogram confirms position with very little residual arterial portal venous shunting.  Drug-eluting beads trans arterial chemo embolization: From this location, 150 mg of doxorubicin drug-eluting beads were delivered predominately into the right hepatic artery. Minimal reflux  into the left hepatic artery. Entire dose was able to be delivered. Post embolization angiographic confirms preserved patency of right hepatic artery with antegrade flow.  Minor catheter and C2 catheter were removed. Right common femoral artery access site was closed with a 5 [place] [employer] device. No immediate complication. Overall, patient tolerated the procedure well. She will be admitted for overnight observation.  IMPRESSION: Successful right and left hepatic artery bland embolizations with 700- 900 micron embospheres to decrease arterial portal venous shunting through the tumor vascularity.  Successful drug-eluting beads trans arterial chemo embolization predominantly to the right hepatic tumor vascularity, total dose administered 150 mg doxorubicin.   Electronically Signed   By: Daryll Brod M.D.   On: 11/25/2013 13:19    Anti-infectives: Anti-infectives    Start     Dose/Rate Route Frequency Ordered Stop   11/25/13 0800  piperacillin-tazobactam (ZOSYN) IVPB 3.375 g     3.375 g100 mL/hr over 30 Minutes Intravenous  Once 11/25/13 0738 11/25/13 1034      Assessment/Plan: s/p repeat bland hepatic embolization/DEB-TACE for [facility] 11/3; for overnight obs; check am labs; f/u with Dr. Annamaria Boots in 2-4 weeks in [facility]  LOS: 0 days    Kyrese Gartman,D [facility] 11/25/2013

## 2013-11-25 NOTE — Procedures (Signed)
Successful combined bland and DEB/TACE RT hepatic embolization No comp Stable Admit for overnight obs recovery

## 2013-11-25 NOTE — H&P (Signed)
Chief Complaint: Hepatocellular carcinoma  Referring Physician(s): Dr. Alen Blew  History of Present Illness: Whitney Patel is a 58 y.o. 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 She is one month status post combined bland embolization and DEB/TACE performed 09/16/2013. She has recovered from the treatment. She did have a 3 day episode of post embolization syndrome at home with nausea, abdominal pain vomiting .Imaging shows some response with less enhancement of the multifocal tumor burden predominantly in the right hepatic lobe with areas of central necrosis. No biliary dilatation. Portal vein remains patent. Liver functions are stable. She presents today for repeat hepatic DEB/TACE/bland embolization.  Past Medical History  Diagnosis Date  . Hypertension   . Hepatocellular carcinoma   . Diabetes mellitus     Past Surgical History  Procedure Laterality Date  . Cesarean section      Allergies: Review of patient's allergies indicates no known allergies.  Medications: Prior to Admission medications   Medication Sig Start Date End Date Taking? Authorizing Provider  amLODipine (NORVASC) 10 MG tablet Take 10 mg by mouth every morning.   Yes Historical Provider, MD  carvedilol (COREG) 12.5 MG tablet Take 6.25 mg by mouth 2 (two) times daily with a meal.   Yes Historical Provider, MD  hydrochlorothiazide (MICROZIDE) 12.5 MG capsule Take 1 capsule (12.5 mg total) by mouth 2 (two) times daily. 11/18/13  Yes Elberta Leatherwood, MD  lisinopril (PRINIVIL,ZESTRIL) 20 MG tablet Take 20 mg by mouth 2 (two) times daily.   Yes Historical Provider, MD  megestrol (MEGACE ES) 625 MG/5ML suspension Take 5 mLs (625 mg total) by mouth daily. 10/03/13   Wyatt Portela, MD  promethazine (PHENERGAN) 25 MG tablet Take 25 mg by mouth every 6 (six) hours as needed for nausea.  09/17/13   Historical Provider, MD    History reviewed. No pertinent family  history.  History   Social History  . Marital Status: Legally Separated    Spouse Name: N/A    Number of Children: N/A  . Years of Education: N/A   Social History Main Topics  . Smoking status: Former Smoker -- 0.25 packs/day for 30 years    Types: Cigarettes    Start date: 10/21/1971  . Smokeless tobacco: None     Comment: 1 pack per week  . Alcohol Use: Yes     Comment: occasion  . Drug Use: No  . Sexual Activity: None   Other Topics Concern  . None   Social History Narrative         Review of Systems    Constitutional: Positive for appetite change and unexpected weight change. Negative for fever.       Night sweats  HENT: Positive for dental problem.   Respiratory: Positive for cough.        1 episode of small amt blood tinged sputum last night; some exertional dyspnea  Cardiovascular:       Occ lower sternal/eipgastric pain  Gastrointestinal: Positive for abdominal pain and abdominal distention. Negative for nausea, vomiting and blood in stool.  Genitourinary: Negative for dysuria and hematuria.  Musculoskeletal: Negative for back pain.  Neurological: Positive for weakness.       Occ HA's  Hematological: Does not bruise/bleed easily.    Vital Signs: BP 100/62 mmHg  Pulse 70  Temp(Src) 98.5 F (36.9 C) (Oral)  Resp 18  SpO2 94%  Physical Exam  Constitutional: She is oriented to person, place, and time.  Thin  BF in NAD  Cardiovascular: Normal rate and regular rhythm.   Pulmonary/Chest: Effort normal and breath sounds normal.  Abdominal: Soft. Bowel sounds are normal. She exhibits distension. There is tenderness.  hepatomegaly  Musculoskeletal: Normal range of motion. She exhibits no edema.  Neurological: She is alert and oriented to person, place, and time.    Imaging: Ct Abd Wo & W Cm  10/28/2013   CLINICAL DATA:  Status post chemo embolization of hepatocellular carcinoma 1 month ago. Alcoholic cirrhosis. Hepatitis-C. Diabetes.  EXAM: CT ABDOMEN  WITHOUT AND WITH CONTRAST  TECHNIQUE: Multidetector CT imaging of the abdomen was performed following the standard protocol before and following the bolus administration of intravenous contrast.  CONTRAST:  182mL OMNIPAQUE IOHEXOL 300 MG/ML  SOLN  COMPARISON:  08/11/2013.  MRI of 06/1913.  FINDINGS: Lower chest: Clear lung bases. Moderate cardiomegaly. Trace right pleural fluid or thickening.  Hepatobiliary: Moderate cirrhosis. Marked hepatomegaly. Nearly 23 cm.  The arterial phase images demonstrate heterogeneous enhancement throughout the liver, greater on the right.  The most direct comparison of liver mass burden is series 5, portal venous phase today versus series 6 of the 08/11/2013 exam.  -Index anterior segment right liver lobe lesion measures 4.2 x 3.4 cm on image 46 today versus 4.3 x 4.0 cm on the prior exam (when remeasured). Less well-defined today.  -Index posterior segment right liver lobe lesion measures 5.3 x 4.6 cm on image 54 of series 5 today versus 5.7 x 5.4 cm on the prior. Greater necrosis today.  -Immediately inferior posterior segment right liver lobe lesion measures 6.1 x 5.2 cm on image 65 today versus 6.2 x 5.0 cm on the prior. This suggests size stability with greater central necrosis identified today.  -In the anterior segment right liver lobe index lesion measures 5.2 x 4.9 cm on image 60 of series 5. Compare 4.5 x 4.3 cm on image 33 of series 6 in the prior exam (when remeasured). Suggests slight interval enlargement.  Tumor extends into the medial segment of the left lobe of the liver, similar. Foci of arterial phase hyper enhancement within the lateral segment left liver lobe, including on image 32 of series 3 are nonspecific. Subcapsular subcentimeter left hepatic lobe cyst.  Normal gallbladder, without biliary ductal dilatation.  Pancreas: Normal, without mass or pancreatic ductal dilatation.  Spleen: Normal  Adrenals/Urinary Tract: Normal adrenal glands. Upper pole right renal  cyst. Upper pole right renal calculus, without hydronephrosis. Normal left kidney.  Stomach/Bowel: Underdistended proximal stomach. Normal abdominal bowel loops.  Vascular/Lymphatic: Aortic and branch vessel atherosclerosis. Patent hepatic and portal veins. No splenic artery aneurysm. Circumaortic left renal vein. No retroperitoneal or retrocrural adenopathy. Porta hepatis node measures 1.7 cm on image 51 of series 5. This is favored to be reactive and is not significantly changed.  Other:  No ascites.  Musculoskeletal: Convex left lumbar spine curvature.  IMPRESSION: 1. Overall mild response to therapy of multi focal hepatocellular carcinoma, primarily within the right lobe of the liver. When directly comparing today's portal venous phase study to that of 08/11/2013, the majority of lesions are similar to slightly smaller with increased necrosis. One lesion, involving the anterior segment right lobe of the liver, demonstrates apparent mild enlargement. 2. No evidence of extrahepatic disease/metastasis. 3. Cirrhosis and marked hepatomegaly. 4. Age advanced atherosclerosis. 5. The anterior segment right portal vein thrombus detailed on the prior CT is not apparent today. 6. Right nephrolithiasis.   Electronically Signed   By: Adria Devon.D.  On: 10/28/2013 16:13    Labs:  CBC:  Recent Labs  09/16/13 0721 09/17/13 0555 09/24/13 1632 11/25/13 0800  WBC 6.3 9.0 7.7 8.1  HGB 12.0 12.2 10.9* 10.4*  HCT 35.7* 35.0* 31.8* 31.9*  PLT 208 228 426* 242    COAGS:  Recent Labs  07/21/13 1238 08/25/13 1111 09/16/13 0721 11/25/13 0800  INR 0.99 0.97 1.06 1.12  APTT  --  29 30 24     BMP:  Recent Labs  08/25/13 1111 08/29/13 0750 09/16/13 0721 09/17/13 0555 09/24/13 1632  NA 143 143 143 137 138  K 3.8 3.8 3.8 3.2* 3.9  CL 103 104 103 95* 100  CO2 27 26 28 25 25   GLUCOSE 110* 103* 116* 84 95  BUN 10 9 7 10 12   CALCIUM 9.4 9.3 9.1 9.3 8.9  CREATININE 0.66 0.66 0.50 0.55 0.79   GFRNONAA >90 >90 >90 >90  --   GFRAA >90 >90 >90 >90  --     LIVER FUNCTION TESTS:  Recent Labs  08/29/13 0750 09/16/13 0721 09/17/13 0555 09/24/13 1632  BILITOT 0.7 0.5 0.6 0.6  AST 78* 103* 178* 45*  ALT 72* 78* 105* 40*  ALKPHOS 150* 181* 178* 139*  PROT 7.9 7.8 7.9 7.6  ALBUMIN 3.9 3.7 3.6 3.9    TUMOR MARKERS:  Recent Labs  07/24/13 1318  AFPTM 9.2*    Assessment and Plan: Whitney Patel is a 59 y.o. 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 She is one month status post combined bland embolization and DEB/TACE performed 09/16/2013. She has recovered from the treatment. She did have a 3 day episode of post embolization syndrome at home with nausea, abdominal pain vomiting .Imaging shows some response with less enhancement of the multifocal tumor burden predominantly in the right hepatic lobe with areas of central necrosis. No biliary dilatation. Portal vein remains patent. Liver functions are stable. She presents today for repeat hepatic  DEB/TACE/bland embolization. Details/risks of procedure d/w pt with her understanding and consent. Following procedure pt will be admitted for overnight observation.          Signed: Autumn Messing 11/25/2013, 8:51 AM

## 2013-11-25 NOTE — Sedation Documentation (Addendum)
5 Fr. Sheath removed from R femoral artery by Dr. Annamaria Boots.  Hemostasis achieved using Exoseal closure device.  Manual pressure held by Dr. Annamaria Boots X 5 mins.  Groin level 0, 3+RDP.

## 2013-11-25 NOTE — Telephone Encounter (Signed)
Patient currently admitted, spoke with patient and she will call to scheduled an appointment once she is back home.

## 2013-11-26 ENCOUNTER — Encounter (HOSPITAL_COMMUNITY)
Admission: RE | Disposition: A | Discharge status: Home Health | Payer: Self-pay | Source: Ambulatory Visit | Attending: Interventional Radiology

## 2013-11-26 ENCOUNTER — Encounter (HOSPITAL_COMMUNITY): Payer: Self-pay

## 2013-11-26 ENCOUNTER — Observation Stay (HOSPITAL_COMMUNITY): Payer: No Typology Code available for payment source

## 2013-11-26 ENCOUNTER — Other Ambulatory Visit: Payer: Self-pay | Admitting: Gastroenterology

## 2013-11-26 DIAGNOSIS — K766 Portal hypertension: Secondary | ICD-10-CM | POA: Diagnosis present

## 2013-11-26 DIAGNOSIS — K3189 Other diseases of stomach and duodenum: Secondary | ICD-10-CM | POA: Diagnosis present

## 2013-11-26 DIAGNOSIS — Z79899 Other long term (current) drug therapy: Secondary | ICD-10-CM | POA: Diagnosis not present

## 2013-11-26 DIAGNOSIS — J9601 Acute respiratory failure with hypoxia: Secondary | ICD-10-CM | POA: Diagnosis not present

## 2013-11-26 DIAGNOSIS — C220 Liver cell carcinoma: Secondary | ICD-10-CM | POA: Diagnosis present

## 2013-11-26 DIAGNOSIS — K7460 Unspecified cirrhosis of liver: Secondary | ICD-10-CM | POA: Diagnosis present

## 2013-11-26 DIAGNOSIS — I10 Essential (primary) hypertension: Secondary | ICD-10-CM | POA: Diagnosis present

## 2013-11-26 DIAGNOSIS — K920 Hematemesis: Secondary | ICD-10-CM | POA: Diagnosis present

## 2013-11-26 DIAGNOSIS — Z6821 Body mass index (BMI) 21.0-21.9, adult: Secondary | ICD-10-CM | POA: Diagnosis not present

## 2013-11-26 DIAGNOSIS — R5381 Other malaise: Secondary | ICD-10-CM | POA: Diagnosis present

## 2013-11-26 DIAGNOSIS — E1165 Type 2 diabetes mellitus with hyperglycemia: Secondary | ICD-10-CM

## 2013-11-26 DIAGNOSIS — I272 Other secondary pulmonary hypertension: Secondary | ICD-10-CM | POA: Diagnosis present

## 2013-11-26 DIAGNOSIS — E119 Type 2 diabetes mellitus without complications: Secondary | ICD-10-CM | POA: Diagnosis present

## 2013-11-26 DIAGNOSIS — D62 Acute posthemorrhagic anemia: Secondary | ICD-10-CM | POA: Diagnosis present

## 2013-11-26 DIAGNOSIS — J189 Pneumonia, unspecified organism: Secondary | ICD-10-CM | POA: Diagnosis not present

## 2013-11-26 DIAGNOSIS — I864 Gastric varices: Secondary | ICD-10-CM | POA: Diagnosis present

## 2013-11-26 DIAGNOSIS — B182 Chronic viral hepatitis C: Secondary | ICD-10-CM | POA: Diagnosis present

## 2013-11-26 DIAGNOSIS — E43 Unspecified severe protein-calorie malnutrition: Secondary | ICD-10-CM | POA: Diagnosis present

## 2013-11-26 DIAGNOSIS — E875 Hyperkalemia: Secondary | ICD-10-CM | POA: Diagnosis not present

## 2013-11-26 DIAGNOSIS — F17210 Nicotine dependence, cigarettes, uncomplicated: Secondary | ICD-10-CM | POA: Diagnosis present

## 2013-11-26 DIAGNOSIS — I369 Nonrheumatic tricuspid valve disorder, unspecified: Secondary | ICD-10-CM

## 2013-11-26 HISTORY — PX: ESOPHAGOGASTRODUODENOSCOPY: SHX5428

## 2013-11-26 LAB — TYPE AND SCREEN
ABO/RH(D): O POS
ANTIBODY SCREEN: NEGATIVE

## 2013-11-26 LAB — ABO/RH: ABO/RH(D): O POS

## 2013-11-26 LAB — COMPREHENSIVE METABOLIC PANEL
ALT: 95 U/L — ABNORMAL HIGH (ref 0–35)
ANION GAP: 15 (ref 5–15)
AST: 228 U/L — ABNORMAL HIGH (ref 0–37)
Albumin: 3 g/dL — ABNORMAL LOW (ref 3.5–5.2)
Alkaline Phosphatase: 174 U/L — ABNORMAL HIGH (ref 39–117)
BUN: 12 mg/dL (ref 6–23)
CALCIUM: 8.9 mg/dL (ref 8.4–10.5)
CO2: 21 mEq/L (ref 19–32)
Chloride: 99 mEq/L (ref 96–112)
Creatinine, Ser: 0.53 mg/dL (ref 0.50–1.10)
GFR calc non Af Amer: >90 mL/min (ref >90)
GLUCOSE: 108 mg/dL — AB (ref 70–99)
Potassium: 5.7 mEq/L — ABNORMAL HIGH (ref 3.7–5.3)
SODIUM: 135 meq/L — AB (ref 137–147)
Total Bilirubin: 1.3 mg/dL — ABNORMAL HIGH (ref 0.3–1.2)
Total Protein: 8 g/dL (ref 6.0–8.3)

## 2013-11-26 LAB — CBC
HCT: 33.6 % — ABNORMAL LOW (ref 36.0–46.0)
HEMOGLOBIN: 11.1 g/dL — AB (ref 12.0–15.0)
MCH: 29.8 pg (ref 26.0–34.0)
MCHC: 33 g/dL (ref 30.0–36.0)
MCV: 90.1 fL (ref 78.0–100.0)
Platelets: 248 10*3/uL (ref 150–400)
RBC: 3.73 MIL/uL — AB (ref 3.87–5.11)
RDW: 16.5 % — ABNORMAL HIGH (ref 11.5–15.5)
WBC: 13.7 10*3/uL — ABNORMAL HIGH (ref 4.0–10.5)

## 2013-11-26 LAB — HEMOGLOBIN A1C
Hgb A1c MFr Bld: 6.2 % — ABNORMAL HIGH (ref <5.7)
Mean Plasma Glucose: 131 mg/dL — ABNORMAL HIGH (ref <117)

## 2013-11-26 LAB — HEMOGLOBIN AND HEMATOCRIT, BLOOD
HEMATOCRIT: 33.3 % — AB (ref 36.0–46.0)
HEMOGLOBIN: 11 g/dL — AB (ref 12.0–15.0)

## 2013-11-26 LAB — PROCALCITONIN

## 2013-11-26 LAB — GLUCOSE, CAPILLARY: GLUCOSE-CAPILLARY: 148 mg/dL — AB (ref 70–99)

## 2013-11-26 SURGERY — EGD (ESOPHAGOGASTRODUODENOSCOPY)
Anesthesia: Moderate Sedation

## 2013-11-26 MED ORDER — FENTANYL CITRATE 0.05 MG/ML IJ SOLN
INTRAMUSCULAR | Status: AC
  Filled 2013-11-26: qty 2

## 2013-11-26 MED ORDER — INSULIN ASPART 100 UNIT/ML ~~LOC~~ SOLN
0.0000 [IU] | Freq: Four times a day (QID) | SUBCUTANEOUS | Status: DC
  Administered 2013-11-26 – 2013-11-28 (×4): 1 [IU] via SUBCUTANEOUS

## 2013-11-26 MED ORDER — SODIUM CHLORIDE 0.9 % IV SOLN
8.0000 mg/h | INTRAVENOUS | Status: DC
  Administered 2013-11-26 – 2013-11-27 (×2): 8 mg/h via INTRAVENOUS
  Filled 2013-11-26 (×5): qty 80

## 2013-11-26 MED ORDER — MIDAZOLAM HCL 10 MG/2ML IJ SOLN
INTRAMUSCULAR | Status: DC | PRN
Start: 2013-11-26 — End: 2013-11-26
  Administered 2013-11-26 (×2): 2 mg via INTRAVENOUS

## 2013-11-26 MED ORDER — DEXTROSE 5 % IV SOLN
500.0000 mg | INTRAVENOUS | Status: DC
  Administered 2013-11-26 – 2013-11-28 (×3): 500 mg via INTRAVENOUS
  Filled 2013-11-26 (×4): qty 500

## 2013-11-26 MED ORDER — MIDAZOLAM HCL 10 MG/2ML IJ SOLN
INTRAMUSCULAR | Status: AC
  Filled 2013-11-26: qty 2

## 2013-11-26 MED ORDER — PANTOPRAZOLE SODIUM 40 MG IV SOLR
40.0000 mg | Freq: Two times a day (BID) | INTRAVENOUS | Status: DC
  Administered 2013-11-30: 40 mg via INTRAVENOUS
  Filled 2013-11-26 (×3): qty 40

## 2013-11-26 MED ORDER — SODIUM CHLORIDE 0.9 % IV SOLN: INTRAVENOUS | Status: DC

## 2013-11-26 MED ORDER — SODIUM CHLORIDE 0.9 % IV SOLN
INTRAVENOUS | Status: DC
  Administered 2013-11-26: 500 mL via INTRAVENOUS

## 2013-11-26 MED ORDER — FUROSEMIDE 10 MG/ML IJ SOLN
20.0000 mg | Freq: Once | INTRAMUSCULAR | Status: AC
  Administered 2013-11-26: 20 mg via INTRAVENOUS
  Filled 2013-11-26: qty 2

## 2013-11-26 MED ORDER — DEXTROSE 5 % IV SOLN
1.0000 g | INTRAVENOUS | Status: DC
  Administered 2013-11-26 – 2013-11-28 (×3): 1 g via INTRAVENOUS
  Filled 2013-11-26 (×4): qty 10

## 2013-11-26 MED ORDER — SODIUM CHLORIDE 0.9 % IV SOLN
50.0000 ug/h | INTRAVENOUS | Status: DC
  Administered 2013-11-26 – 2013-11-28 (×5): 50 ug/h via INTRAVENOUS
  Filled 2013-11-26 (×9): qty 1

## 2013-11-26 MED ORDER — SODIUM CHLORIDE 0.9 % IV SOLN
80.0000 mg | Freq: Once | INTRAVENOUS | Status: AC
  Administered 2013-11-26: 80 mg via INTRAVENOUS
  Filled 2013-11-26 (×2): qty 80

## 2013-11-26 MED ORDER — OCTREOTIDE LOAD VIA INFUSION
50.0000 ug | Freq: Once | INTRAVENOUS | Status: AC
  Administered 2013-11-26: 50 ug via INTRAVENOUS
  Filled 2013-11-26: qty 25

## 2013-11-26 MED ORDER — LIDOCAINE VISCOUS 2 % MT SOLN
OROMUCOSAL | Status: AC
  Filled 2013-11-26: qty 15

## 2013-11-26 MED ORDER — DIPHENHYDRAMINE HCL 50 MG/ML IJ SOLN
INTRAMUSCULAR | Status: AC
  Filled 2013-11-26: qty 1

## 2013-11-26 MED ORDER — FENTANYL CITRATE 0.05 MG/ML IJ SOLN
INTRAMUSCULAR | Status: DC | PRN
  Administered 2013-11-26 (×3): 25 ug via INTRAVENOUS

## 2013-11-26 NOTE — Plan of Care (Signed)
Problem: Phase I Progression Outcomes Goal: Pain controlled with appropriate interventions Outcome: Progressing Patient asked PCA be turned off around 2350. Pain mild and feels she "has too much medicine in her" currently.

## 2013-11-26 NOTE — Op Note (Addendum)
[facility] [place], [address]   OPERATIVE PROCEDURE REPORT  PATIENT :Whitney Patel, Whitney Patel  MR#: [id number] BIRTHDATE :1955-11-10 GENDER: female ENDOSCOPIST: Edmonia James, MD ASSISTANT:   Wray Kearns, RN Corliss Parish, technician PROCEDURE DATE: December 11, 2013 PRE-PROCEDURE PREPERATION: Patient fasted for 8 hours prior to the procedure. PRE-PROCEDURE PHYSICAL: Patient has stable vital signs on 3 liters of oxgyen. Neck is supple.  There is no JVD, thyromegaly or LAD. Chest clear to auscultation.  S1 and S2 regular.  Abdomen soft, non-distended, non-tender with NABS. PROCEDURE:     EGD, diagnostic ASA CLASS:     Class IV INDICATIONS:     1) Hematemesis 2) History of multifocal HCC in the setting of Hepatitis C cirrhosis. MEDICATIONS:     Fentanyl 75 mcg and Versed 4 mg IV. TOPICAL ANESTHETIC:   Viscous xylocaine-10 cc PO.  DESCRIPTION OF PROCEDURE: After the risks benefits and alternatives of the procedure were thoroughly explained, informed consent was obtained.  The Pentax [id number] 3.8 [id number]  was introduced through the mouth and advanced to the second portion of the duodenum , without limitations. The instrument was slowly withdrawn as the mucosa was fully examined.      ESOPHAGUS: The mucosa of the esophagus appeared normal.  STOMACH: There were gastric varices noted on retroflexion along with a small hiatal hernia; portal hypertensive gastropathy noted throughout the stomach.  DUODENUM: The duodenal mucosa showed no abnormalities.  a. . The scope was then withdrawn from the patient and the procedure terminated. The patient tolerated the procedure without immediate complications.  IMPRESSION:  1) Widely patent esophagus with no varices or source of bleeding identified. 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 3) Portal hypertensive gastropathy was noted throughout the stomach.  4) The duodenal mucosa showed no  abnormalities.  RECOMMENDATIONS:     1.  Anti-reflux regimen to be followed. 2.  Avoid all NSAIDS. 3.  Continue PPI's 4.  Continue current medications. 5. Will contact Dr. Karin Golden for a BRTO procedure.  REPEAT EXAM:  no repeat exam planned for now. Marland Kitchen DISCHARGE INSTRUCTIONS: standard instructions given.  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  The verification of the translation of this report to the ICD and CPT codes and modifiers is the sole responsibility of the health care institution and practicing physician where this report was generated.  [employer] will not be held responsible for the validity of the ICD and CPT codes included on this report.  AMA assumes no liability for data contained or not contained herein. CPT is a Designer, television/film set of the [employer].   CC: Larena Glassman, MD  PATIENT NAME:  Ivonna, Kinnick MR#: [id number]

## 2013-11-26 NOTE — Consult Note (Addendum)
[facility] Medical Consultation  Priscella Donna GNF:[id number] DOB: Jul 03, 1955 DOA: 11/25/2013 PCP: Elberta Leatherwood, MD   Requesting physician: [name] Date of consultation: 11/26/2013 Reason for consultation:  hematemesis  Impression/Recommendations Active Problems:   Hepatocellular carcinoma  Hematemesis, may be due to portal hypertensive gastropathy, PUD, gastritis.  Varices less likely given slow nature of bleeding, but possible.  -  NPO with IVF and antiemetics -  Type and screen complete -  PPI and octreotide gtt pending GI evaluation -  Start ceftriaxone for prophylaxis  -  GI consult:  EGD demonstrate gastric varices  Acute hypoxic respiratory failure may be secondary to community acquired pneumonia (< 24 hour obs three months ago for procedure) or acute on chronic diastolic heart failure -  CXR:  Vascular congestion with patchy airspace disease left greater than right, asymmetric edema -  Lasix 20mg  IV once -  Procalcitonin -  Blood cultures -  Ceftriaxone + azithromycin   Cirrhosis due to hepatitis C, per GI  HCC s/p embolization x 2 -  Mild transaminitis today post-procedure -  Repeat CMP in AM  Hyperkalemia, likely spurious as specimen hemolyzed  HTN, blood pressure stable -  Hold HCTZ and ACEI for now  -  Hold additional BP medications if bleeding continues or BP drops  DM type 2, last A1c was 6 off of her diabetes medications -  Repeat A1c -  Start low dose SSI  Protein calorie malnutrition, may be severe based on weight loss and cachexia -  Nutrition consultation -  Regular diet with suppleements once diet advanced  -  Resume megace  Heart murmur with wide split S2 -  ECG:  NSR -  ECHO:  Preserved EF with grade 1DD, mild MR, mildly dilated RV, moderately dilated RA, moderate TR and moderate to severe pulmonary arterial HTN:  69 mmHg  Leukocytosis, likely reactive from recent procedure -  Monitor for signs of pneumonia, UTI -  Currently  afebrile -  Repeat CBC in AM  Normocytic anemia, likely due to chronic disease and acute blood loss -  Repeat H&H this afternoon  Diet: NPO Access: PIV IVF: yes Proph: SCD  Code Status: full Family Communication: patient and daughter Disposition Plan:  Home pending EGD, tolerating diet, bleeding slowing  Chief Complaint: hematemesis  HPI:  The patient is a 58 y.o. year-old female with history of hepatitis C with cirrhosis and stage IIIA HCC, DM, HTN. She completed combined bland embolization and DEB/TACE on 09/16/2013 and had since recovered. Imaging demonstrated less enhancement of the multifocal tumor burden in the right hepatic lobe with areas of central necrosis. She presented on 11/3 for repeat hepatic DEB/TACE/bland embolization. She is followed by Drs. Shadad and [facility].  She tolerated her procedure well yesterday, however, postprocedure, she developed hematemesis and was vomiting approximately 50-75 mL's of bright red blood mixed with water.  This happened 2-3 times overnight and this morning, her emesis had become pink-tinged but less bloody.  She denies other abnormal bruising and bleeding, blood in stools, melena, changes in bowel habits.  Denies previous upper endoscopy, varices, NSAID use, EtOH use, or medication changes recently. Upon further questioning, she had hematemesis even prior to admission which happened once about 2 days prior to admission. The radiologist has already contacted gastroenterologist Dr. Collene Mares who is planning to perform EGD later today for diagnosis and possible treatment. She is being transferred to the internal medicine service for ongoing management. Vital signs are stable and her hemoglobin remained stable.  Review of Systems:   Denies fevers, chills, weight gain, changes to hearing and vision.  + weight loss and poor appetite.  Denies rhinorrhea, sinus congestion, sore throat.  Denies chest pain and palpitations.  Denies SOB, wheezing, cough.   Denies dysuria, frequency, urgency, polyuria, polydipsia.  Denies lymphadenopathy.  Denies arthralgias, myalgias.  Denies skin rash or ulcer.  Denies lower extremity edema.  Denies focal numbness, weakness, slurred speech, confusion, facial droop.  Denies anxiety and depression.    Past Medical History  Diagnosis Date  . Hypertension   . Hepatocellular carcinoma   . Diabetes mellitus   . Hepatitis C    Past Surgical History  Procedure Laterality Date  . Cesarean section  1981  . Hepatic artery embolization      08/2013 and again 11/26/2013   Social History:  reports that she has been smoking Cigarettes.  She started smoking about 42 years ago. She has a 7.5 pack-year smoking history. She has never used smokeless tobacco. She reports that she does not drink alcohol or use illicit drugs.  No Known Allergies Family History  Problem Relation Age of Onset  . High blood pressure Neg Hx   . Diabetes Neg Hx   . Cancer Neg Hx     Prior to Admission medications   Medication Sig Start Date End Date Taking? Authorizing Provider  amLODipine (NORVASC) 10 MG tablet Take 10 mg by mouth every morning.   Yes Historical Provider, MD  carvedilol (COREG) 12.5 MG tablet Take 6.25 mg by mouth 2 (two) times daily with a meal.   Yes Historical Provider, MD  hydrochlorothiazide (MICROZIDE) 12.5 MG capsule Take 1 capsule (12.5 mg total) by mouth 2 (two) times daily. 11/18/13  Yes Elberta Leatherwood, MD  lisinopril (PRINIVIL,ZESTRIL) 20 MG tablet Take 20 mg by mouth 2 (two) times daily.   Yes Historical Provider, MD  megestrol (MEGACE ES) 625 MG/5ML suspension Take 5 mLs (625 mg total) by mouth daily. 10/03/13   Wyatt Portela, MD  promethazine (PHENERGAN) 25 MG tablet Take 25 mg by mouth every 6 (six) hours as needed for nausea.  09/17/13   Historical Provider, MD   Physical Exam: Blood pressure 128/65, pulse 83, temperature 98.6 F (37 C), temperature source Oral, resp. rate 16, height 5\' 5"  (1.651 m), weight 58.968  kg (130 lb), SpO2 90 %. Filed Vitals:   11/26/13 0104 11/26/13 0228 11/26/13 0418 11/26/13 0556  BP:  131/64  128/65  Pulse: 80 81 79 83  Temp:    98.6 F (37 C)  TempSrc:    Oral  Resp: 20 21 22 16   Height:      Weight:      SpO2: 96% 91% 96% 90%     General:  Cachectic BF, NAD  Eyes:  PERRL, anicteric, non-injected.  ENT:  Nares clear.  OP clear, non-erythematous without plaques or exudates.  MMM.  Neck:  Supple without TM or JVD.    Lymph:  No cervical, supraclavicular, or submandibular LAD.  Cardiovascular:  RRR, normal S1, wide split S2, 2/6 systolic murmur RUSB.  2+ pulses, warm extremities  Respiratory:  CTA bilaterally without increased WOB.  Abdomen:  NABS.  Soft, moderately distended, firm to palpation RUQ and TTP wtihout rebound or guarding  Skin:  No rashes or focal lesions.  Musculoskeletal:  Normal bulk and tone.  No LE edema.  Psychiatric:  A & O x 4.  Appropriate affect.  Neurologic:  CN 3-12 intact.  5/5 strength.  Sensation intact.  Labs on Admission:  Basic Metabolic Panel:  Recent Labs Lab 11/25/13 0800 11/26/13 0850  NA 139 135*  K 4.3 5.7*  CL 101 99  CO2 24 21  GLUCOSE 142* 108*  BUN 11 12  CREATININE 0.72 0.53  CALCIUM 9.0 8.9   Liver Function Tests:  Recent Labs Lab 11/25/13 0800 11/26/13 0850  AST 108* 228*  ALT 63* 95*  ALKPHOS 192* 174*  BILITOT 1.2 1.3*  PROT 7.7 8.0  ALBUMIN 3.2* 3.0*   No results for input(s): LIPASE, AMYLASE in the last 168 hours. No results for input(s): AMMONIA in the last 168 hours. CBC:  Recent Labs Lab 11/25/13 0800 11/26/13 0850  WBC 8.1 13.7*  NEUTROABS 5.4  --   HGB 10.4* 11.1*  HCT 31.9* 33.6*  MCV 90.1 90.1  PLT 242 248   Cardiac Enzymes: No results for input(s): CKTOTAL, CKMB, CKMBINDEX, TROPONINI in the last 168 hours. BNP: Invalid input(s): POCBNP CBG:  Recent Labs Lab 11/25/13 0814  GLUCAP 137*    Radiological Exams on Admission: Ir Angiogram Visceral  Selective  11/25/2013   CLINICAL DATA:  Multifocal diffuse hepatocellular carcinoma  EXAM: ULTRASOUND GUIDANCE FOR VASCULAR ACCESS  CELIAC ANGIOGRAM  COMMON HEPATIC, PROPER HEPATIC, RIGHT AND LEFT HEPATIC ANGIOGRAMS  RIGHT AND LEFT HEPATIC ARTERY BLAND EMBOLIZATIONS  RIGHT HEPATIC ARTERY DRUG-ELUTING BEADS TRANS ARTERIAL CHEMO EMBOLIZATION  Date:  11/3/201511/03/2013 11:52 am  Radiologist:  M. Daryll Brod, MD  Guidance:  Ultrasound and fluoroscopic  FLUOROSCOPY TIME:  12 min 36 seconds, 2,853 mGy  MEDICATIONS AND MEDICAL HISTORY: 4 mg versed, 200 mcg fentanyl, 20 mg Decadron, 4 mg Zofran, 3.375 g Zosyn administered within 1 hr of the procedure  ANESTHESIA/SEDATION: 1 hr 12 min  CONTRAST:  140 cc Omnipaque [id number]  COMPLICATIONS: None immediate  PROCEDURE: Informed consent was obtained from the patient following explanation of the procedure, risks, benefits and alternatives. The patient understands, agrees and consents for the procedure. All questions were addressed. A time out was performed.  Maximal barrier sterile technique utilized including caps, mask, sterile gowns, sterile gloves, large sterile drape, hand hygiene, and Betadine.  Under sterile conditions and local anesthesia, ultrasound micropuncture access was performed of the right common femoral artery. Five French sheath inserted over a Bentson guidewire. C2 catheter utilized to select the celiac origin. Selective celiac angiogram performed.  Celiac angiogram: Celiac origin is widely patent. Hepatic, gastroduodenal, and splenic vasculature are patent. Arterial portal venous shunting demonstrated through the diffuse hepatic tumor vascularity.  Micro catheter was utilized to select the common hepatic artery selective common hepatic angiogram performed. This again demonstrates patency of the right and left hepatic arteries. Diffuse hepatic tumor vascularity demonstrated with early arterial portal venous shunting.  Micro catheter utilized to select the right  hepatic artery. Selective right hepatic angiogram also confirms arterial portal venous shunting with diffuse tumor vascularity.  Right hepatic artery Bland embolization: From this location, right hepatic artery bland embolization was performed with 1 vial of 700- 900 micron embospheres. This is in preparation for chemo embolization to decrease the arterial portal venous shunting prior to delivering drug-eluting beads. Following right hepatic artery bland embolization, there is significant reduction in the arterial portal venous shunting.  Next, the micro catheter was utilized to select the left hepatic artery. Selective left hepatic angiogram and bland embolization performed.  Left hepatic bland embolization: Left hepatic artery is widely patent with diffuse arterial tumor vascularity and portal venous shunting into the left portal vein. Bland embolization  performed with 1 vial of 700 - 900 micron embospheres to decrease the arterial portal venous shunting.  Micro catheter was utilized to re- select the right hepatic artery. Repeat right hepatic arteriogram confirms position with very little residual arterial portal venous shunting.  Drug-eluting beads trans arterial chemo embolization: From this location, 150 mg of doxorubicin drug-eluting beads were delivered predominately into the right hepatic artery. Minimal reflux into the left hepatic artery. Entire dose was able to be delivered. Post embolization angiographic confirms preserved patency of right hepatic artery with antegrade flow.  Minor catheter and C2 catheter were removed. Right common femoral artery access site was closed with a 5 [place] Star[employer] device. No immediate complication. Overall, patient tolerated the procedure well. She will be admitted for overnight observation.  IMPRESSION: Successful right and left hepatic artery bland embolizations with 700- 900 micron embospheres to decrease arterial portal venous shunting through the tumor vascularity.   Successful drug-eluting beads trans arterial chemo embolization predominantly to the right hepatic tumor vascularity, total dose administered 150 mg doxorubicin.   Electronically Signed   By: Daryll Brod M.D.   On: 11/25/2013 13:19   Ir Angiogram Selective Each Additional Vessel  11/25/2013   CLINICAL DATA:  Multifocal diffuse hepatocellular carcinoma  EXAM: ULTRASOUND GUIDANCE FOR VASCULAR ACCESS  CELIAC ANGIOGRAM  COMMON HEPATIC, PROPER HEPATIC, RIGHT AND LEFT HEPATIC ANGIOGRAMS  RIGHT AND LEFT HEPATIC ARTERY BLAND EMBOLIZATIONS  RIGHT HEPATIC ARTERY DRUG-ELUTING BEADS TRANS ARTERIAL CHEMO EMBOLIZATION  Date:  11/3/201511/03/2013 11:52 am  Radiologist:  M. Daryll Brod, MD  Guidance:  Ultrasound and fluoroscopic  FLUOROSCOPY TIME:  12 min 36 seconds, 2,853 mGy  MEDICATIONS AND MEDICAL HISTORY: 4 mg versed, 200 mcg fentanyl, 20 mg Decadron, 4 mg Zofran, 3.375 g Zosyn administered within 1 hr of the procedure  ANESTHESIA/SEDATION: 1 hr 12 min  CONTRAST:  140 cc Omnipaque [id number]  COMPLICATIONS: None immediate  PROCEDURE: Informed consent was obtained from the patient following explanation of the procedure, risks, benefits and alternatives. The patient understands, agrees and consents for the procedure. All questions were addressed. A time out was performed.  Maximal barrier sterile technique utilized including caps, mask, sterile gowns, sterile gloves, large sterile drape, hand hygiene, and Betadine.  Under sterile conditions and local anesthesia, ultrasound micropuncture access was performed of the right common femoral artery. Five French sheath inserted over a Bentson guidewire. C2 catheter utilized to select the celiac origin. Selective celiac angiogram performed.  Celiac angiogram: Celiac origin is widely patent. Hepatic, gastroduodenal, and splenic vasculature are patent. Arterial portal venous shunting demonstrated through the diffuse hepatic tumor vascularity.  Micro catheter was utilized to select the common  hepatic artery selective common hepatic angiogram performed. This again demonstrates patency of the right and left hepatic arteries. Diffuse hepatic tumor vascularity demonstrated with early arterial portal venous shunting.  Micro catheter utilized to select the right hepatic artery. Selective right hepatic angiogram also confirms arterial portal venous shunting with diffuse tumor vascularity.  Right hepatic artery Bland embolization: From this location, right hepatic artery bland embolization was performed with 1 vial of 700- 900 micron embospheres. This is in preparation for chemo embolization to decrease the arterial portal venous shunting prior to delivering drug-eluting beads. Following right hepatic artery bland embolization, there is significant reduction in the arterial portal venous shunting.  Next, the micro catheter was utilized to select the left hepatic artery. Selective left hepatic angiogram and bland embolization performed.  Left hepatic bland embolization: Left hepatic artery is widely patent with  diffuse arterial tumor vascularity and portal venous shunting into the left portal vein. Bland embolization performed with 1 vial of 700 - 900 micron embospheres to decrease the arterial portal venous shunting.  Micro catheter was utilized to re- select the right hepatic artery. Repeat right hepatic arteriogram confirms position with very little residual arterial portal venous shunting.  Drug-eluting beads trans arterial chemo embolization: From this location, 150 mg of doxorubicin drug-eluting beads were delivered predominately into the right hepatic artery. Minimal reflux into the left hepatic artery. Entire dose was able to be delivered. Post embolization angiographic confirms preserved patency of right hepatic artery with antegrade flow.  Minor catheter and C2 catheter were removed. Right common femoral artery access site was closed with a 5 [place] [employer] device. No immediate complication. Overall,  patient tolerated the procedure well. She will be admitted for overnight observation.  IMPRESSION: Successful right and left hepatic artery bland embolizations with 700- 900 micron embospheres to decrease arterial portal venous shunting through the tumor vascularity.  Successful drug-eluting beads trans arterial chemo embolization predominantly to the right hepatic tumor vascularity, total dose administered 150 mg doxorubicin.   Electronically Signed   By: Daryll Brod M.D.   On: 11/25/2013 13:19   Ir Angiogram Selective Each Additional Vessel  11/25/2013   CLINICAL DATA:  Multifocal diffuse hepatocellular carcinoma  EXAM: ULTRASOUND GUIDANCE FOR VASCULAR ACCESS  CELIAC ANGIOGRAM  COMMON HEPATIC, PROPER HEPATIC, RIGHT AND LEFT HEPATIC ANGIOGRAMS  RIGHT AND LEFT HEPATIC ARTERY BLAND EMBOLIZATIONS  RIGHT HEPATIC ARTERY DRUG-ELUTING BEADS TRANS ARTERIAL CHEMO EMBOLIZATION  Date:  11/3/201511/03/2013 11:52 am  Radiologist:  M. Daryll Brod, MD  Guidance:  Ultrasound and fluoroscopic  FLUOROSCOPY TIME:  12 min 36 seconds, 2,853 mGy  MEDICATIONS AND MEDICAL HISTORY: 4 mg versed, 200 mcg fentanyl, 20 mg Decadron, 4 mg Zofran, 3.375 g Zosyn administered within 1 hr of the procedure  ANESTHESIA/SEDATION: 1 hr 12 min  CONTRAST:  140 cc Omnipaque [id number]  COMPLICATIONS: None immediate  PROCEDURE: Informed consent was obtained from the patient following explanation of the procedure, risks, benefits and alternatives. The patient understands, agrees and consents for the procedure. All questions were addressed. A time out was performed.  Maximal barrier sterile technique utilized including caps, mask, sterile gowns, sterile gloves, large sterile drape, hand hygiene, and Betadine.  Under sterile conditions and local anesthesia, ultrasound micropuncture access was performed of the right common femoral artery. Five French sheath inserted over a Bentson guidewire. C2 catheter utilized to select the celiac origin. Selective celiac  angiogram performed.  Celiac angiogram: Celiac origin is widely patent. Hepatic, gastroduodenal, and splenic vasculature are patent. Arterial portal venous shunting demonstrated through the diffuse hepatic tumor vascularity.  Micro catheter was utilized to select the common hepatic artery selective common hepatic angiogram performed. This again demonstrates patency of the right and left hepatic arteries. Diffuse hepatic tumor vascularity demonstrated with early arterial portal venous shunting.  Micro catheter utilized to select the right hepatic artery. Selective right hepatic angiogram also confirms arterial portal venous shunting with diffuse tumor vascularity.  Right hepatic artery Bland embolization: From this location, right hepatic artery bland embolization was performed with 1 vial of 700- 900 micron embospheres. This is in preparation for chemo embolization to decrease the arterial portal venous shunting prior to delivering drug-eluting beads. Following right hepatic artery bland embolization, there is significant reduction in the arterial portal venous shunting.  Next, the micro catheter was utilized to select the left hepatic artery. Selective left hepatic angiogram and  bland embolization performed.  Left hepatic bland embolization: Left hepatic artery is widely patent with diffuse arterial tumor vascularity and portal venous shunting into the left portal vein. Bland embolization performed with 1 vial of 700 - 900 micron embospheres to decrease the arterial portal venous shunting.  Micro catheter was utilized to re- select the right hepatic artery. Repeat right hepatic arteriogram confirms position with very little residual arterial portal venous shunting.  Drug-eluting beads trans arterial chemo embolization: From this location, 150 mg of doxorubicin drug-eluting beads were delivered predominately into the right hepatic artery. Minimal reflux into the left hepatic artery. Entire dose was able to be  delivered. Post embolization angiographic confirms preserved patency of right hepatic artery with antegrade flow.  Minor catheter and C2 catheter were removed. Right common femoral artery access site was closed with a 5 [place] [employer] device. No immediate complication. Overall, patient tolerated the procedure well. She will be admitted for overnight observation.  IMPRESSION: Successful right and left hepatic artery bland embolizations with 700- 900 micron embospheres to decrease arterial portal venous shunting through the tumor vascularity.  Successful drug-eluting beads trans arterial chemo embolization predominantly to the right hepatic tumor vascularity, total dose administered 150 mg doxorubicin.   Electronically Signed   By: Daryll Brod M.D.   On: 11/25/2013 13:19   Ir Angiogram Selective Each Additional Vessel  11/25/2013   CLINICAL DATA:  Multifocal diffuse hepatocellular carcinoma  EXAM: ULTRASOUND GUIDANCE FOR VASCULAR ACCESS  CELIAC ANGIOGRAM  COMMON HEPATIC, PROPER HEPATIC, RIGHT AND LEFT HEPATIC ANGIOGRAMS  RIGHT AND LEFT HEPATIC ARTERY BLAND EMBOLIZATIONS  RIGHT HEPATIC ARTERY DRUG-ELUTING BEADS TRANS ARTERIAL CHEMO EMBOLIZATION  Date:  11/3/201511/03/2013 11:52 am  Radiologist:  M. Daryll Brod, MD  Guidance:  Ultrasound and fluoroscopic  FLUOROSCOPY TIME:  12 min 36 seconds, 2,853 mGy  MEDICATIONS AND MEDICAL HISTORY: 4 mg versed, 200 mcg fentanyl, 20 mg Decadron, 4 mg Zofran, 3.375 g Zosyn administered within 1 hr of the procedure  ANESTHESIA/SEDATION: 1 hr 12 min  CONTRAST:  140 cc Omnipaque [id number]  COMPLICATIONS: None immediate  PROCEDURE: Informed consent was obtained from the patient following explanation of the procedure, risks, benefits and alternatives. The patient understands, agrees and consents for the procedure. All questions were addressed. A time out was performed.  Maximal barrier sterile technique utilized including caps, mask, sterile gowns, sterile gloves, large sterile drape,  hand hygiene, and Betadine.  Under sterile conditions and local anesthesia, ultrasound micropuncture access was performed of the right common femoral artery. Five French sheath inserted over a Bentson guidewire. C2 catheter utilized to select the celiac origin. Selective celiac angiogram performed.  Celiac angiogram: Celiac origin is widely patent. Hepatic, gastroduodenal, and splenic vasculature are patent. Arterial portal venous shunting demonstrated through the diffuse hepatic tumor vascularity.  Micro catheter was utilized to select the common hepatic artery selective common hepatic angiogram performed. This again demonstrates patency of the right and left hepatic arteries. Diffuse hepatic tumor vascularity demonstrated with early arterial portal venous shunting.  Micro catheter utilized to select the right hepatic artery. Selective right hepatic angiogram also confirms arterial portal venous shunting with diffuse tumor vascularity.  Right hepatic artery Bland embolization: From this location, right hepatic artery bland embolization was performed with 1 vial of 700- 900 micron embospheres. This is in preparation for chemo embolization to decrease the arterial portal venous shunting prior to delivering drug-eluting beads. Following right hepatic artery bland embolization, there is significant reduction in the arterial portal venous shunting.  Next, the  micro catheter was utilized to select the left hepatic artery. Selective left hepatic angiogram and bland embolization performed.  Left hepatic bland embolization: Left hepatic artery is widely patent with diffuse arterial tumor vascularity and portal venous shunting into the left portal vein. Bland embolization performed with 1 vial of 700 - 900 micron embospheres to decrease the arterial portal venous shunting.  Micro catheter was utilized to re- select the right hepatic artery. Repeat right hepatic arteriogram confirms position with very little residual arterial  portal venous shunting.  Drug-eluting beads trans arterial chemo embolization: From this location, 150 mg of doxorubicin drug-eluting beads were delivered predominately into the right hepatic artery. Minimal reflux into the left hepatic artery. Entire dose was able to be delivered. Post embolization angiographic confirms preserved patency of right hepatic artery with antegrade flow.  Minor catheter and C2 catheter were removed. Right common femoral artery access site was closed with a 5 [place] [employer] device. No immediate complication. Overall, patient tolerated the procedure well. She will be admitted for overnight observation.  IMPRESSION: Successful right and left hepatic artery bland embolizations with 700- 900 micron embospheres to decrease arterial portal venous shunting through the tumor vascularity.  Successful drug-eluting beads trans arterial chemo embolization predominantly to the right hepatic tumor vascularity, total dose administered 150 mg doxorubicin.   Electronically Signed   By: Daryll Brod M.D.   On: 11/25/2013 13:19   Ir US Guide Vasc Access Right  11/25/2013   CLINICAL DATA:  Multifocal diffuse hepatocellular carcinoma  EXAM: ULTRASOUND GUIDANCE FOR VASCULAR ACCESS  CELIAC ANGIOGRAM  COMMON HEPATIC, PROPER HEPATIC, RIGHT AND LEFT HEPATIC ANGIOGRAMS  RIGHT AND LEFT HEPATIC ARTERY BLAND EMBOLIZATIONS  RIGHT HEPATIC ARTERY DRUG-ELUTING BEADS TRANS ARTERIAL CHEMO EMBOLIZATION  Date:  11/3/201511/03/2013 11:52 am  Radiologist:  M. Daryll Brod, MD  Guidance:  Ultrasound and fluoroscopic  FLUOROSCOPY TIME:  12 min 36 seconds, 2,853 mGy  MEDICATIONS AND MEDICAL HISTORY: 4 mg versed, 200 mcg fentanyl, 20 mg Decadron, 4 mg Zofran, 3.375 g Zosyn administered within 1 hr of the procedure  ANESTHESIA/SEDATION: 1 hr 12 min  CONTRAST:  140 cc Omnipaque [id number]  COMPLICATIONS: None immediate  PROCEDURE: Informed consent was obtained from the patient following explanation of the procedure, risks, benefits and  alternatives. The patient understands, agrees and consents for the procedure. All questions were addressed. A time out was performed.  Maximal barrier sterile technique utilized including caps, mask, sterile gowns, sterile gloves, large sterile drape, hand hygiene, and Betadine.  Under sterile conditions and local anesthesia, ultrasound micropuncture access was performed of the right common femoral artery. Five French sheath inserted over a Bentson guidewire. C2 catheter utilized to select the celiac origin. Selective celiac angiogram performed.  Celiac angiogram: Celiac origin is widely patent. Hepatic, gastroduodenal, and splenic vasculature are patent. Arterial portal venous shunting demonstrated through the diffuse hepatic tumor vascularity.  Micro catheter was utilized to select the common hepatic artery selective common hepatic angiogram performed. This again demonstrates patency of the right and left hepatic arteries. Diffuse hepatic tumor vascularity demonstrated with early arterial portal venous shunting.  Micro catheter utilized to select the right hepatic artery. Selective right hepatic angiogram also confirms arterial portal venous shunting with diffuse tumor vascularity.  Right hepatic artery Bland embolization: From this location, right hepatic artery bland embolization was performed with 1 vial of 700- 900 micron embospheres. This is in preparation for chemo embolization to decrease the arterial portal venous shunting prior to delivering drug-eluting beads. Following right hepatic artery  bland embolization, there is significant reduction in the arterial portal venous shunting.  Next, the micro catheter was utilized to select the left hepatic artery. Selective left hepatic angiogram and bland embolization performed.  Left hepatic bland embolization: Left hepatic artery is widely patent with diffuse arterial tumor vascularity and portal venous shunting into the left portal vein. Bland embolization  performed with 1 vial of 700 - 900 micron embospheres to decrease the arterial portal venous shunting.  Micro catheter was utilized to re- select the right hepatic artery. Repeat right hepatic arteriogram confirms position with very little residual arterial portal venous shunting.  Drug-eluting beads trans arterial chemo embolization: From this location, 150 mg of doxorubicin drug-eluting beads were delivered predominately into the right hepatic artery. Minimal reflux into the left hepatic artery. Entire dose was able to be delivered. Post embolization angiographic confirms preserved patency of right hepatic artery with antegrade flow.  Minor catheter and C2 catheter were removed. Right common femoral artery access site was closed with a 5 [place] Star[employer] device. No immediate complication. Overall, patient tolerated the procedure well. She will be admitted for overnight observation.  IMPRESSION: Successful right and left hepatic artery bland embolizations with 700- 900 micron embospheres to decrease arterial portal venous shunting through the tumor vascularity.  Successful drug-eluting beads trans arterial chemo embolization predominantly to the right hepatic tumor vascularity, total dose administered 150 mg doxorubicin.   Electronically Signed   By: Daryll Brod M.D.   On: 11/25/2013 13:19   Ir Embo Tumor Organ Ischemia Infarct Inc Guide Roadmapping  11/25/2013   CLINICAL DATA:  Multifocal diffuse hepatocellular carcinoma  EXAM: ULTRASOUND GUIDANCE FOR VASCULAR ACCESS  CELIAC ANGIOGRAM  COMMON HEPATIC, PROPER HEPATIC, RIGHT AND LEFT HEPATIC ANGIOGRAMS  RIGHT AND LEFT HEPATIC ARTERY BLAND EMBOLIZATIONS  RIGHT HEPATIC ARTERY DRUG-ELUTING BEADS TRANS ARTERIAL CHEMO EMBOLIZATION  Date:  11/3/201511/03/2013 11:52 am  Radiologist:  M. Daryll Brod, MD  Guidance:  Ultrasound and fluoroscopic  FLUOROSCOPY TIME:  12 min 36 seconds, 2,853 mGy  MEDICATIONS AND MEDICAL HISTORY: 4 mg versed, 200 mcg fentanyl, 20 mg  Decadron, 4 mg Zofran, 3.375 g Zosyn administered within 1 hr of the procedure  ANESTHESIA/SEDATION: 1 hr 12 min  CONTRAST:  140 cc Omnipaque [id number]  COMPLICATIONS: None immediate  PROCEDURE: Informed consent was obtained from the patient following explanation of the procedure, risks, benefits and alternatives. The patient understands, agrees and consents for the procedure. All questions were addressed. A time out was performed.  Maximal barrier sterile technique utilized including caps, mask, sterile gowns, sterile gloves, large sterile drape, hand hygiene, and Betadine.  Under sterile conditions and local anesthesia, ultrasound micropuncture access was performed of the right common femoral artery. Five French sheath inserted over a Bentson guidewire. C2 catheter utilized to select the celiac origin. Selective celiac angiogram performed.  Celiac angiogram: Celiac origin is widely patent. Hepatic, gastroduodenal, and splenic vasculature are patent. Arterial portal venous shunting demonstrated through the diffuse hepatic tumor vascularity.  Micro catheter was utilized to select the common hepatic artery selective common hepatic angiogram performed. This again demonstrates patency of the right and left hepatic arteries. Diffuse hepatic tumor vascularity demonstrated with early arterial portal venous shunting.  Micro catheter utilized to select the right hepatic artery. Selective right hepatic angiogram also confirms arterial portal venous shunting with diffuse tumor vascularity.  Right hepatic artery Bland embolization: From this location, right hepatic artery bland embolization was performed with 1 vial of 700- 900 micron embospheres. This is in preparation for  chemo embolization to decrease the arterial portal venous shunting prior to delivering drug-eluting beads. Following right hepatic artery bland embolization, there is significant reduction in the arterial portal venous shunting.  Next, the micro catheter was  utilized to select the left hepatic artery. Selective left hepatic angiogram and bland embolization performed.  Left hepatic bland embolization: Left hepatic artery is widely patent with diffuse arterial tumor vascularity and portal venous shunting into the left portal vein. Bland embolization performed with 1 vial of 700 - 900 micron embospheres to decrease the arterial portal venous shunting.  Micro catheter was utilized to re- select the right hepatic artery. Repeat right hepatic arteriogram confirms position with very little residual arterial portal venous shunting.  Drug-eluting beads trans arterial chemo embolization: From this location, 150 mg of doxorubicin drug-eluting beads were delivered predominately into the right hepatic artery. Minimal reflux into the left hepatic artery. Entire dose was able to be delivered. Post embolization angiographic confirms preserved patency of right hepatic artery with antegrade flow.  Minor catheter and C2 catheter were removed. Right common femoral artery access site was closed with a 5 [place] Star[employer] device. No immediate complication. Overall, patient tolerated the procedure well. She will be admitted for overnight observation.  IMPRESSION: Successful right and left hepatic artery bland embolizations with 700- 900 micron embospheres to decrease arterial portal venous shunting through the tumor vascularity.  Successful drug-eluting beads trans arterial chemo embolization predominantly to the right hepatic tumor vascularity, total dose administered 150 mg doxorubicin.   Electronically Signed   By: Daryll Brod M.D.   On: 11/25/2013 13:19    EKG: Independently reviewed.   Time spent: 75 min  Janece Canterbury [facility] Pager [phone]  If 7PM-7AM, please contact night-coverage [email or web address] Password [email or web address] 11/26/2013, 12:03 PM

## 2013-11-26 NOTE — Progress Notes (Signed)
Patient with 2 episodes of emesis with bright red blood (about 50-75 ml each time) emesis is mucus like and liquidy mixture. Patient reports she had episode this morning at home. + nausea. Little po intake. Paged Dr Pascal Lux. Orders received.

## 2013-11-26 NOTE — Progress Notes (Signed)
Wasted 270 mcg of fentanyl with [employer].  Whitney Patel

## 2013-11-26 NOTE — Progress Notes (Signed)
INITIAL NUTRITION ASSESSMENT  DOCUMENTATION CODES Per approved criteria  -Non-severe (moderate) malnutrition in the context of chronic illness   INTERVENTION: NPO with diet advancement as tolerated Resume Megace per MD Add Boost Plus tid when diet advanced. RD to follow.  NUTRITION DIAGNOSIS: Inadequate oral intake related to inability to eat as evidenced by npo status.   Goal: Intake of meals and supplements to meet >90% estimated needs.  Monitor:  Diet advancement, diet tolerance, PO intake, labs, weight trend.  Reason for Assessment: MST  58 y.o. 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  Patient admitted for hepatic DEB/TACE/bland embolization.  Patient is currently NPO for procedure.  Then advanced diet as tolerated to Regular with supplements. Patient reports sometimes eating well and other times poor intake.   Takes Megace at times which is not always beneficial. Complains of poor appetite and nausea. States taking Ensure in the past but made her "head tight" after drinking this and refuses it now.  Willing to try Boost. 8% weight loss in the last 4-5 months.  Nutrition Focused Physical Exam:  Subcutaneous Fat:  Orbital Region: wnl Upper Arm Region: mild Thoracic and Lumbar Region: n/a  Muscle:  Temple Region: moderate Clavicle Bone Region: wnl Clavicle and Acromion Bone Region: wnl Scapular Bone Region: wnl Dorsal Hand: moderate Patellar Region: severe Anterior Thigh Region: severe Posterior Calf Region: severe  Edema: not noted    Height: Ht Readings from Last 1 Encounters:  11/25/13 5\' 5"  (1.651 m)    Weight: Wt Readings from Last 1 Encounters:  11/25/13 130 lb (58.968 kg)    Ideal Body Weight: 125 lbs  % Ideal Body Weight: 104  Wt Readings from Last 10 Encounters:  11/25/13 130 lb (58.968 kg)  11/20/13 130 lb 3.2 oz (59.058 kg)   11/06/13 135 lb (61.236 kg)  10/30/13 137 lb (62.143 kg)  10/13/13 137 lb 8 oz (62.37 kg)  09/24/13 133 lb (60.328 kg)  09/16/13 133 lb (60.328 kg)  09/03/13 133 lb (60.328 kg)  08/06/13 136 lb (61.689 kg)  07/24/13 138 lb 4.8 oz (62.732 kg)    Usual Body Weight: 141 lbs in June  % Usual Body Weight: 92  BMI:  Body mass index is 21.63 kg/(m^2).  Estimated Nutritional Needs: Kcal: 1700-1900 Protein: 65-75 gm Fluid: >/=1.5L daily  Skin: intact  Diet Order: Diet NPO time specified  EDUCATION NEEDS: -No education needs identified at this time   Intake/Output Summary (Last 24 hours) at 11/26/13 1609 Last data filed at 11/26/13 1440  Gross per 24 hour  Intake 1533.5 ml  Output    475 ml  Net 1058.5 ml    Labs:   Recent Labs Lab 11/25/13 0800 11/26/13 0850  NA 139 135*  K 4.3 5.7*  CL 101 99  CO2 24 21  BUN 11 12  CREATININE 0.72 0.53  CALCIUM 9.0 8.9  GLUCOSE 142* 108*    CBG (last 3)   Recent Labs  11/25/13 0814  GLUCAP 137*    Scheduled Meds: . [MAR Hold] amLODipine  10 mg Oral q morning - 10a  . [MAR Hold] carvedilol  6.25 mg Oral BID WC  . [MAR Hold] cefTRIAXone (ROCEPHIN)  IV  1 g Intravenous Q24H  . [MAR Hold] docusate sodium  100 mg Oral BID  . [MAR Hold] insulin aspart  0-9 Units Subcutaneous 4 times per day  . [MAR Hold] pantoprazole ([place]) IV  40 mg Intravenous Q12H  . [MAR Hold] sodium chloride  3 mL Intravenous Q12H    Continuous Infusions: . sodium chloride 1,000 mL (11/25/13 2156)  . sodium chloride 500 mL (11/26/13 1543)  . sodium chloride    . sodium chloride    . octreotide  (SANDOSTATIN)    IV infusion 50 mcg/hr (11/26/13 1430)  . pantoprozole (PROTONIX) infusion 8 mg/hr (11/26/13 1450)    Past Medical History  Diagnosis Date  . Hypertension   . Hepatocellular carcinoma   . Diabetes mellitus   . Hepatitis C     Past Surgical History  Procedure Laterality Date  . Cesarean section  1981  . Hepatic artery  embolization      08/2013 and again 11/26/2013    Antonieta Iba, RD, LDN Clinical Inpatient Dietitian Pager:  [phone] Weekend and after hours pager:  [phone]

## 2013-11-26 NOTE — Plan of Care (Signed)
Problem: Phase I Progression Outcomes Goal: Voiding-avoid urinary catheter unless indicated Outcome: Not Met (add Reason) Patient has not voided since late evening yesterday. Denies need to void.

## 2013-11-26 NOTE — Progress Notes (Signed)
  Echocardiogram 2D Echocardiogram has been performed.  Whitney Patel 11/26/2013, 2:34 PM

## 2013-11-26 NOTE — Progress Notes (Signed)
Late entry: Pt came down to have EGD and O2 sat on RA was 88%. Pt was put onto 2L with O2 sat at 93% and 3L for the procedure. I will send the patient back to the floor on 2L of O2.

## 2013-11-26 NOTE — Consult Note (Signed)
[name] Reason for Consult: Hematemesis. Referring Physician: IR-Dr. Ree Edman is an 58 y.o. female.  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 Patient was not able to give me much in the way of details on her medical history as she was actively vomiting blood when I was examining her. 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 Pascal Lux. She denies having any melena or hematochezia, dysphagia or odynophagia. .    Past Medical History  Diagnosis Date  . Hypertension   . Hepatocellular carcinoma   . Diabetes mellitus   . Hepatitis C    Past Surgical History  Procedure Laterality Date  . Cesarean section  1981  . Hepatic artery embolization      08/2013 and again 11/26/2013   Family History  Problem Relation Age of Onset  . High blood pressure Neg Hx   . Diabetes Neg Hx   . Cancer Neg Hx    Social History:  reports that she has been smoking Cigarettes.  She started smoking about 42 years ago. She has a 7.5 pack-year smoking history. She has never used smokeless tobacco. She reports that she does not drink alcohol but has a history of drug abuse-the details are not available to me at this time.  Allergies: No Known Allergies  Medications: I have reviewed the patient's current medications.  Results for orders placed or performed during the hospital encounter of 11/25/13 (from the past 48 hour(s))  APTT     Status: None   Collection Time: 11/25/13  8:00 AM  Result Value Ref Range   aPTT 24 24 - 37 seconds  CBC with Differential     Status: Abnormal   Collection Time: 11/25/13  8:00 AM  Result Value Ref Range   WBC 8.1 4.0 - 10.5 K/uL   RBC 3.54 (L) 3.87 - 5.11 MIL/uL   Hemoglobin 10.4 (L) 12.0 - 15.0 g/dL   HCT 31.9 (L) 36.0 - 46.0 %   MCV 90.1 78.0 - 100.0 fL   MCH 29.4 26.0 - 34.0 pg   MCHC 32.6 30.0  - 36.0 g/dL   RDW 16.7 (H) 11.5 - 15.5 %   Platelets 242 150 - 400 K/uL   Neutrophils Relative % 67 43 - 77 %   Neutro Abs 5.4 1.7 - 7.7 K/uL   Lymphocytes Relative 24 12 - 46 %   Lymphs Abs 1.9 0.7 - 4.0 K/uL   Monocytes Relative 8 3 - 12 %   Monocytes Absolute 0.6 0.1 - 1.0 K/uL   Eosinophils Relative 1 0 - 5 %   Eosinophils Absolute 0.1 0.0 - 0.7 K/uL   Basophils Relative 0 0 - 1 %   Basophils Absolute 0.0 0.0 - 0.1 K/uL  Comprehensive metabolic panel     Status: Abnormal   Collection Time: 11/25/13  8:00 AM  Result Value Ref Range   Sodium 139 137 - 147 mEq/L   Potassium 4.3 3.7 - 5.3 mEq/L   Chloride 101 96 - 112 mEq/L   CO2 24 19 - 32 mEq/L   Glucose, Bld 142 (H) 70 - 99 mg/dL   BUN 11 6 - 23 mg/dL   Creatinine, Ser 0.72 0.50 - 1.10 mg/dL   Calcium 9.0 8.4 - 10.5 mg/dL   Total Protein 7.7 6.0 - 8.3 g/dL   Albumin  3.2 (L) 3.5 - 5.2 g/dL   AST 108 (H) 0 - 37 U/L    Comment: SLIGHT HEMOLYSIS HEMOLYSIS AT THIS LEVEL MAY AFFECT RESULT    ALT 63 (H) 0 - 35 U/L   Alkaline Phosphatase 192 (H) 39 - 117 U/L   Total Bilirubin 1.2 0.3 - 1.2 mg/dL   GFR calc non Af Amer >90 >90 mL/min   GFR calc Af Amer >90 >90 mL/min    Comment: (NOTE) The eGFR has been calculated using the CKD EPI equation. This calculation has not been validated in all clinical situations. eGFR's persistently <90 mL/min signify possible Chronic Kidney Disease.    Anion gap 14 5 - 15  Protime-INR     Status: None   Collection Time: 11/25/13  8:00 AM  Result Value Ref Range   Prothrombin Time 14.5 11.6 - 15.2 seconds   INR 1.12 0.00 - 1.49  Type and screen     Status: None   Collection Time: 11/25/13 10:30 PM  Result Value Ref Range   ABO/RH(D) O POS    Antibody Screen NEG    Sample Expiration 11/28/2013   CBC     Status: Abnormal   Collection Time: 11/26/13  8:50 AM  Result Value Ref Range   WBC 13.7 (H) 4.0 - 10.5 K/uL   RBC 3.73 (L) 3.87 - 5.11 MIL/uL   Hemoglobin 11.1 (L) 12.0 - 15.0 g/dL    HCT 33.6 (L) 36.0 - 46.0 %   MCV 90.1 78.0 - 100.0 fL   MCH 29.8 26.0 - 34.0 pg   MCHC 33.0 30.0 - 36.0 g/dL   RDW 16.5 (H) 11.5 - 15.5 %   Platelets 248 150 - 400 K/uL  Comprehensive metabolic panel     Status: Abnormal   Collection Time: 11/26/13  8:50 AM  Result Value Ref Range   Sodium 135 (L) 137 - 147 mEq/L   Potassium 5.7 (H) 3.7 - 5.3 mEq/L    Comment: DELTA CHECK NOTED REPEATED TO VERIFY MODERATE HEMOLYSIS HEMOLYSIS AT THIS LEVEL MAY AFFECT RESULT    Chloride 99 96 - 112 mEq/L   CO2 21 19 - 32 mEq/L   Glucose, Bld 108 (H) 70 - 99 mg/dL   BUN 12 6 - 23 mg/dL   Creatinine, Ser 0.53 0.50 - 1.10 mg/dL   Calcium 8.9 8.4 - 10.5 mg/dL   Total Protein 8.0 6.0 - 8.3 g/dL   Albumin 3.0 (L) 3.5 - 5.2 g/dL   AST 228 (H) 0 - 37 U/L    Comment: MODERATE HEMOLYSIS HEMOLYSIS AT THIS LEVEL MAY AFFECT RESULT    ALT 95 (H) 0 - 35 U/L    Comment: MODERATE HEMOLYSIS HEMOLYSIS AT THIS LEVEL MAY AFFECT RESULT    Alkaline Phosphatase 174 (H) 39 - 117 U/L    Comment: MODERATE HEMOLYSIS HEMOLYSIS AT THIS LEVEL MAY AFFECT RESULT    Total Bilirubin 1.3 (H) 0.3 - 1.2 mg/dL   GFR calc non Af Amer >90 >90 mL/min   GFR calc Af Amer >90 >90 mL/min    Comment: (NOTE) The eGFR has been calculated using the CKD EPI equation. This calculation has not been validated in all clinical situations. eGFR's persistently <90 mL/min signify possible Chronic Kidney Disease.    Anion gap 15 5 - 15  Hemoglobin and hematocrit, blood     Status: Abnormal   Collection Time: 11/26/13 12:30 PM  Result Value Ref Range   Hemoglobin 11.0 (L) 12.0 - 15.0 g/dL  HCT 33.3 (L) 36.0 - 46.0 %   Guidance: Ultrasound and fluoroscopic  FLUOROSCOPY TIME: 12 min 36 seconds, 2,853 mGy  MEDICATIONS AND MEDICAL HISTORY: 4 mg versed, 200 mcg fentanyl, 20 mg Decadron, 4 mg Zofran, 3.375 g Zosyn administered within 1 hr of the procedure  ANESTHESIA/SEDATION: 1 hr 12 min  CONTRAST: 140 cc Omnipaque  [id number]  COMPLICATIONS: None immediate  PROCEDURE: Informed consent was obtained from the patient following explanation of the procedure, risks, benefits and alternatives. The patient understands, agrees and consents for the procedure. All questions were addressed. A time out was performed.  Maximal barrier sterile technique utilized including caps, mask, sterile gowns, sterile gloves, large sterile drape, hand hygiene, and Betadine.  Under sterile conditions and local anesthesia, ultrasound micropuncture access was performed of the right common femoral artery. Five French sheath inserted over a Bentson guidewire. C2 catheter utilized to select the celiac origin. Selective celiac angiogram performed.  Celiac angiogram: Celiac origin is widely patent. Hepatic, gastroduodenal, and splenic vasculature are patent. Arterial portal venous shunting demonstrated through the diffuse hepatic tumor vascularity.  Micro catheter was utilized to select the common hepatic artery selective common hepatic angiogram performed. This again demonstrates patency of the right and left hepatic arteries. Diffuse hepatic tumor vascularity demonstrated with early arterial portal venous shunting.  Micro catheter utilized to select the right hepatic artery. Selective right hepatic angiogram also confirms arterial portal venous shunting with diffuse tumor vascularity.  Right hepatic artery Bland embolization: From this location, right hepatic artery bland embolization was performed with 1 vial of 700- 900 micron embospheres. This is in preparation for chemo embolization to decrease the arterial portal venous shunting prior to delivering drug-eluting beads. Following right hepatic artery bland embolization, there is significant reduction in the arterial portal venous shunting.  Next, the micro catheter was utilized to select the left hepatic artery. Selective left hepatic angiogram and bland  embolization performed.  Left hepatic bland embolization: Left hepatic artery is widely patent with diffuse arterial tumor vascularity and portal venous shunting into the left portal vein. Bland embolization performed with 1 vial of 700 - 900 micron embospheres to decrease the arterial portal venous shunting.  Micro catheter was utilized to re- select the right hepatic artery. Repeat right hepatic arteriogram confirms position with very little residual arterial portal venous shunting.  Drug-eluting beads trans arterial chemo embolization: From this location, 150 mg of doxorubicin drug-eluting beads were delivered predominately into the right hepatic artery. Minimal reflux into the left hepatic artery. Entire dose was able to be delivered. Post embolization angiographic confirms preserved patency of right hepatic artery with antegrade flow.  Minor catheter and C2 catheter were removed. Right common femoral artery access site was closed with a 5 [place] [employer] device. No immediate complication. Overall, patient tolerated the procedure well. She will be admitted for overnight observation.  IMPRESSION: Successful right and left hepatic artery bland embolizations with 700- 900 micron embospheres to decrease arterial portal venous shunting through the tumor vascularity.  Successful drug-eluting beads trans arterial chemo embolization predominantly to the right hepatic tumor vascularity, total dose administered 150 mg doxorubicin.  Electronically Signed  By: Daryll Brod M.D.  On: 11/25/2013   Review of Systems  Constitutional: Positive for weight loss and malaise/fatigue. Negative for fever, chills and diaphoresis.  HENT: Negative.   Eyes: Negative.   Respiratory: Positive for shortness of breath.   Gastrointestinal: Positive for nausea and vomiting.  Genitourinary: Negative.   Musculoskeletal: Positive for joint pain.  Skin: Negative.  Neurological: Positive  for weakness.  Psychiatric/Behavioral: Positive for substance abuse. The patient is nervous/anxious.    Blood pressure 138/Whitney, pulse 71, temperature 98.4 F (36.9 C), temperature source Oral, resp. rate 22, height $RemoveBe'5\' 5"'[id number]$  (1.651 m), weight 58.968 kg (130 lb), SpO2 95 %. Physical Exam  Constitutional: She appears cachectic. She is cooperative. She appears toxic. She has a sickly appearance.  HENT:  Head: Normocephalic and atraumatic.  Eyes: Lids are normal.  Neck: Neck supple.  Cardiovascular: Normal rate and regular rhythm.   Respiratory: Effort normal and breath sounds normal.  GI: Soft. There is hepatosplenomegaly. There is no tenderness.  Neurological: She is alert.  Psychiatric: Her speech is normal and behavior is normal. Judgment and thought content normal. Her mood appears anxious. Cognition and memory are normal. She exhibits a depressed mood.   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 Coagulation studies are WNL. Proceed with an EGD today. Further recommendations will be made thereafter. 2) HTN. 3) AODM. 4) Severe malnutrition.  5) History of tobacco and drug abuse.  Jshawn Hurta 11/26/2013, 3:53 PM

## 2013-11-26 NOTE — Progress Notes (Signed)
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 Patient states no RUQ or right groin pain today post procedure. She denies any fever or chills. She admits to one episode of bright bloody emesis day before procedure and 4 episodes after procedure last episode was < 10 cc and pink colored rather than bright red at 0400. She denies any nausea or history of bloody emesis prior to these episodes. She denies following with a GI specialist in the past.   Allergies: Review of patient's allergies indicates no known allergies.  Medications: Prior to Admission medications   Medication Sig Start Date End Date Taking? Authorizing Provider  amLODipine (NORVASC) 10 MG tablet Take 10 mg by mouth every morning.   Yes Historical Provider, MD  carvedilol (COREG) 12.5 MG tablet Take 6.25 mg by mouth 2 (two) times daily with a meal.   Yes Historical Provider, MD  hydrochlorothiazide (MICROZIDE) 12.5 MG capsule Take 1 capsule (12.5 mg total) by mouth 2 (two) times daily. 11/18/13  Yes Elberta Leatherwood, MD  lisinopril (PRINIVIL,ZESTRIL) 20 MG tablet Take 20 mg by mouth 2 (two) times daily.   Yes Historical Provider, MD  megestrol (MEGACE ES) 625 MG/5ML suspension Take 5 mLs (625 mg total) by mouth daily. 10/03/13   Wyatt Portela, MD  promethazine (PHENERGAN) 25 MG tablet Take 25 mg by mouth every 6 (six) hours as needed for nausea.  09/17/13   Historical Provider, MD    Review of Systems  Vital Signs: BP 128/65 mmHg  Pulse 83  Temp(Src) 98.6 F (37 C) (Oral)  Resp 16  Ht 5\' 5"  (1.651 m)  Wt 130 lb (58.968 kg)  BMI 21.63 kg/m2  SpO2 90%  Physical Exam General: A&Ox3, NAD ABD: Distended, NT Ext: RCFA access site C/D/I, soft, NT, no signs of bleeding or hematoma, DP 2+   Imaging: Ir Angiogram Visceral Selective  11/25/2013   CLINICAL DATA:  Multifocal diffuse hepatocellular carcinoma  EXAM: ULTRASOUND GUIDANCE FOR VASCULAR ACCESS  CELIAC  ANGIOGRAM  COMMON HEPATIC, PROPER HEPATIC, RIGHT AND LEFT HEPATIC ANGIOGRAMS  RIGHT AND LEFT HEPATIC ARTERY BLAND EMBOLIZATIONS  RIGHT HEPATIC ARTERY DRUG-ELUTING BEADS TRANS ARTERIAL CHEMO EMBOLIZATION  Date:  11/3/201511/03/2013 11:52 am  Radiologist:  M. Daryll Brod, MD  Guidance:  Ultrasound and fluoroscopic  FLUOROSCOPY TIME:  12 min 36 seconds, 2,853 mGy  MEDICATIONS AND MEDICAL HISTORY: 4 mg versed, 200 mcg fentanyl, 20 mg Decadron, 4 mg Zofran, 3.375 g Zosyn administered within 1 hr of the procedure  ANESTHESIA/SEDATION: 1 hr 12 min  CONTRAST:  140 cc Omnipaque 182  COMPLICATIONS: None immediate  PROCEDURE: Informed consent was obtained from the patient following explanation of the procedure, risks, benefits and alternatives. The patient understands, agrees and consents for the procedure. All questions were addressed. A time out was performed.  Maximal barrier sterile technique utilized including caps, mask, sterile gowns, sterile gloves, large sterile drape, hand hygiene, and Betadine.  Under sterile conditions and local anesthesia, ultrasound micropuncture access was performed of the right common femoral artery. Five French sheath inserted over a Bentson guidewire. C2 catheter utilized to select the celiac origin. Selective celiac angiogram performed.  Celiac angiogram: Celiac origin is widely patent. Hepatic, gastroduodenal, and splenic vasculature are patent. Arterial portal venous shunting demonstrated through the diffuse hepatic tumor vascularity.  Micro catheter was utilized to select the common hepatic artery selective common hepatic angiogram performed. This again demonstrates patency of the right and left hepatic arteries. Diffuse hepatic tumor vascularity  demonstrated with early arterial portal venous shunting.  Micro catheter utilized to select the right hepatic artery. Selective right hepatic angiogram also confirms arterial portal venous shunting with diffuse tumor vascularity.  Right hepatic  artery Bland embolization: From this location, right hepatic artery bland embolization was performed with 1 vial of 700- 900 micron embospheres. This is in preparation for chemo embolization to decrease the arterial portal venous shunting prior to delivering drug-eluting beads. Following right hepatic artery bland embolization, there is significant reduction in the arterial portal venous shunting.  Next, the micro catheter was utilized to select the left hepatic artery. Selective left hepatic angiogram and bland embolization performed.  Left hepatic bland embolization: Left hepatic artery is widely patent with diffuse arterial tumor vascularity and portal venous shunting into the left portal vein. Bland embolization performed with 1 vial of 700 - 900 micron embospheres to decrease the arterial portal venous shunting.  Micro catheter was utilized to re- select the right hepatic artery. Repeat right hepatic arteriogram confirms position with very little residual arterial portal venous shunting.  Drug-eluting beads trans arterial chemo embolization: From this location, 150 mg of doxorubicin drug-eluting beads were delivered predominately into the right hepatic artery. Minimal reflux into the left hepatic artery. Entire dose was able to be delivered. Post embolization angiographic confirms preserved patency of right hepatic artery with antegrade flow.  Minor catheter and C2 catheter were removed. Right common femoral artery access site was closed with a 5 [place] Star[employer] device. No immediate complication. Overall, patient tolerated the procedure well. She will be admitted for overnight observation.  IMPRESSION: Successful right and left hepatic artery bland embolizations with 700- 900 micron embospheres to decrease arterial portal venous shunting through the tumor vascularity.  Successful drug-eluting beads trans arterial chemo embolization predominantly to the right hepatic tumor vascularity, total dose administered  150 mg doxorubicin.   Electronically Signed   By: Daryll Brod M.D.   On: 11/25/2013 13:19   Ir Angiogram Selective Each Additional Vessel  11/25/2013   CLINICAL DATA:  Multifocal diffuse hepatocellular carcinoma  EXAM: ULTRASOUND GUIDANCE FOR VASCULAR ACCESS  CELIAC ANGIOGRAM  COMMON HEPATIC, PROPER HEPATIC, RIGHT AND LEFT HEPATIC ANGIOGRAMS  RIGHT AND LEFT HEPATIC ARTERY BLAND EMBOLIZATIONS  RIGHT HEPATIC ARTERY DRUG-ELUTING BEADS TRANS ARTERIAL CHEMO EMBOLIZATION  Date:  11/3/201511/03/2013 11:52 am  Radiologist:  M. Daryll Brod, MD  Guidance:  Ultrasound and fluoroscopic  FLUOROSCOPY TIME:  12 min 36 seconds, 2,853 mGy  MEDICATIONS AND MEDICAL HISTORY: 4 mg versed, 200 mcg fentanyl, 20 mg Decadron, 4 mg Zofran, 3.375 g Zosyn administered within 1 hr of the procedure  ANESTHESIA/SEDATION: 1 hr 12 min  CONTRAST:  140 cc Omnipaque 161  COMPLICATIONS: None immediate  PROCEDURE: Informed consent was obtained from the patient following explanation of the procedure, risks, benefits and alternatives. The patient understands, agrees and consents for the procedure. All questions were addressed. A time out was performed.  Maximal barrier sterile technique utilized including caps, mask, sterile gowns, sterile gloves, large sterile drape, hand hygiene, and Betadine.  Under sterile conditions and local anesthesia, ultrasound micropuncture access was performed of the right common femoral artery. Five French sheath inserted over a Bentson guidewire. C2 catheter utilized to select the celiac origin. Selective celiac angiogram performed.  Celiac angiogram: Celiac origin is widely patent. Hepatic, gastroduodenal, and splenic vasculature are patent. Arterial portal venous shunting demonstrated through the diffuse hepatic tumor vascularity.  Micro catheter was utilized to select the common hepatic artery selective common hepatic angiogram performed.  This again demonstrates patency of the right and left hepatic arteries. Diffuse  hepatic tumor vascularity demonstrated with early arterial portal venous shunting.  Micro catheter utilized to select the right hepatic artery. Selective right hepatic angiogram also confirms arterial portal venous shunting with diffuse tumor vascularity.  Right hepatic artery Bland embolization: From this location, right hepatic artery bland embolization was performed with 1 vial of 700- 900 micron embospheres. This is in preparation for chemo embolization to decrease the arterial portal venous shunting prior to delivering drug-eluting beads. Following right hepatic artery bland embolization, there is significant reduction in the arterial portal venous shunting.  Next, the micro catheter was utilized to select the left hepatic artery. Selective left hepatic angiogram and bland embolization performed.  Left hepatic bland embolization: Left hepatic artery is widely patent with diffuse arterial tumor vascularity and portal venous shunting into the left portal vein. Bland embolization performed with 1 vial of 700 - 900 micron embospheres to decrease the arterial portal venous shunting.  Micro catheter was utilized to re- select the right hepatic artery. Repeat right hepatic arteriogram confirms position with very little residual arterial portal venous shunting.  Drug-eluting beads trans arterial chemo embolization: From this location, 150 mg of doxorubicin drug-eluting beads were delivered predominately into the right hepatic artery. Minimal reflux into the left hepatic artery. Entire dose was able to be delivered. Post embolization angiographic confirms preserved patency of right hepatic artery with antegrade flow.  Minor catheter and C2 catheter were removed. Right common femoral artery access site was closed with a 5 [place] [employer] device. No immediate complication. Overall, patient tolerated the procedure well. She will be admitted for overnight observation.  IMPRESSION: Successful right and left hepatic artery  bland embolizations with 700- 900 micron embospheres to decrease arterial portal venous shunting through the tumor vascularity.  Successful drug-eluting beads trans arterial chemo embolization predominantly to the right hepatic tumor vascularity, total dose administered 150 mg doxorubicin.   Electronically Signed   By: Daryll Brod M.D.   On: 11/25/2013 13:19   Ir Angiogram Selective Each Additional Vessel  11/25/2013   CLINICAL DATA:  Multifocal diffuse hepatocellular carcinoma  EXAM: ULTRASOUND GUIDANCE FOR VASCULAR ACCESS  CELIAC ANGIOGRAM  COMMON HEPATIC, PROPER HEPATIC, RIGHT AND LEFT HEPATIC ANGIOGRAMS  RIGHT AND LEFT HEPATIC ARTERY BLAND EMBOLIZATIONS  RIGHT HEPATIC ARTERY DRUG-ELUTING BEADS TRANS ARTERIAL CHEMO EMBOLIZATION  Date:  11/3/201511/03/2013 11:52 am  Radiologist:  M. Daryll Brod, MD  Guidance:  Ultrasound and fluoroscopic  FLUOROSCOPY TIME:  12 min 36 seconds, 2,853 mGy  MEDICATIONS AND MEDICAL HISTORY: 4 mg versed, 200 mcg fentanyl, 20 mg Decadron, 4 mg Zofran, 3.375 g Zosyn administered within 1 hr of the procedure  ANESTHESIA/SEDATION: 1 hr 12 min  CONTRAST:  140 cc Omnipaque 272  COMPLICATIONS: None immediate  PROCEDURE: Informed consent was obtained from the patient following explanation of the procedure, risks, benefits and alternatives. The patient understands, agrees and consents for the procedure. All questions were addressed. A time out was performed.  Maximal barrier sterile technique utilized including caps, mask, sterile gowns, sterile gloves, large sterile drape, hand hygiene, and Betadine.  Under sterile conditions and local anesthesia, ultrasound micropuncture access was performed of the right common femoral artery. Five French sheath inserted over a Bentson guidewire. C2 catheter utilized to select the celiac origin. Selective celiac angiogram performed.  Celiac angiogram: Celiac origin is widely patent. Hepatic, gastroduodenal, and splenic vasculature are patent. Arterial  portal venous shunting demonstrated through the diffuse hepatic tumor vascularity.  Micro catheter was utilized to select the common hepatic artery selective common hepatic angiogram performed. This again demonstrates patency of the right and left hepatic arteries. Diffuse hepatic tumor vascularity demonstrated with early arterial portal venous shunting.  Micro catheter utilized to select the right hepatic artery. Selective right hepatic angiogram also confirms arterial portal venous shunting with diffuse tumor vascularity.  Right hepatic artery Bland embolization: From this location, right hepatic artery bland embolization was performed with 1 vial of 700- 900 micron embospheres. This is in preparation for chemo embolization to decrease the arterial portal venous shunting prior to delivering drug-eluting beads. Following right hepatic artery bland embolization, there is significant reduction in the arterial portal venous shunting.  Next, the micro catheter was utilized to select the left hepatic artery. Selective left hepatic angiogram and bland embolization performed.  Left hepatic bland embolization: Left hepatic artery is widely patent with diffuse arterial tumor vascularity and portal venous shunting into the left portal vein. Bland embolization performed with 1 vial of 700 - 900 micron embospheres to decrease the arterial portal venous shunting.  Micro catheter was utilized to re- select the right hepatic artery. Repeat right hepatic arteriogram confirms position with very little residual arterial portal venous shunting.  Drug-eluting beads trans arterial chemo embolization: From this location, 150 mg of doxorubicin drug-eluting beads were delivered predominately into the right hepatic artery. Minimal reflux into the left hepatic artery. Entire dose was able to be delivered. Post embolization angiographic confirms preserved patency of right hepatic artery with antegrade flow.  Minor catheter and C2 catheter  were removed. Right common femoral artery access site was closed with a 5 [place] [employer] device. No immediate complication. Overall, patient tolerated the procedure well. She will be admitted for overnight observation.  IMPRESSION: Successful right and left hepatic artery bland embolizations with 700- 900 micron embospheres to decrease arterial portal venous shunting through the tumor vascularity.  Successful drug-eluting beads trans arterial chemo embolization predominantly to the right hepatic tumor vascularity, total dose administered 150 mg doxorubicin.   Electronically Signed   By: Daryll Brod M.D.   On: 11/25/2013 13:19   Ir Angiogram Selective Each Additional Vessel  11/25/2013   CLINICAL DATA:  Multifocal diffuse hepatocellular carcinoma  EXAM: ULTRASOUND GUIDANCE FOR VASCULAR ACCESS  CELIAC ANGIOGRAM  COMMON HEPATIC, PROPER HEPATIC, RIGHT AND LEFT HEPATIC ANGIOGRAMS  RIGHT AND LEFT HEPATIC ARTERY BLAND EMBOLIZATIONS  RIGHT HEPATIC ARTERY DRUG-ELUTING BEADS TRANS ARTERIAL CHEMO EMBOLIZATION  Date:  11/3/201511/03/2013 11:52 am  Radiologist:  M. Daryll Brod, MD  Guidance:  Ultrasound and fluoroscopic  FLUOROSCOPY TIME:  12 min 36 seconds, 2,853 mGy  MEDICATIONS AND MEDICAL HISTORY: 4 mg versed, 200 mcg fentanyl, 20 mg Decadron, 4 mg Zofran, 3.375 g Zosyn administered within 1 hr of the procedure  ANESTHESIA/SEDATION: 1 hr 12 min  CONTRAST:  140 cc Omnipaque 633  COMPLICATIONS: None immediate  PROCEDURE: Informed consent was obtained from the patient following explanation of the procedure, risks, benefits and alternatives. The patient understands, agrees and consents for the procedure. All questions were addressed. A time out was performed.  Maximal barrier sterile technique utilized including caps, mask, sterile gowns, sterile gloves, large sterile drape, hand hygiene, and Betadine.  Under sterile conditions and local anesthesia, ultrasound micropuncture access was performed of the right common femoral  artery. Five French sheath inserted over a Bentson guidewire. C2 catheter utilized to select the celiac origin. Selective celiac angiogram performed.  Celiac angiogram: Celiac origin is widely patent. Hepatic, gastroduodenal, and splenic  vasculature are patent. Arterial portal venous shunting demonstrated through the diffuse hepatic tumor vascularity.  Micro catheter was utilized to select the common hepatic artery selective common hepatic angiogram performed. This again demonstrates patency of the right and left hepatic arteries. Diffuse hepatic tumor vascularity demonstrated with early arterial portal venous shunting.  Micro catheter utilized to select the right hepatic artery. Selective right hepatic angiogram also confirms arterial portal venous shunting with diffuse tumor vascularity.  Right hepatic artery Bland embolization: From this location, right hepatic artery bland embolization was performed with 1 vial of 700- 900 micron embospheres. This is in preparation for chemo embolization to decrease the arterial portal venous shunting prior to delivering drug-eluting beads. Following right hepatic artery bland embolization, there is significant reduction in the arterial portal venous shunting.  Next, the micro catheter was utilized to select the left hepatic artery. Selective left hepatic angiogram and bland embolization performed.  Left hepatic bland embolization: Left hepatic artery is widely patent with diffuse arterial tumor vascularity and portal venous shunting into the left portal vein. Bland embolization performed with 1 vial of 700 - 900 micron embospheres to decrease the arterial portal venous shunting.  Micro catheter was utilized to re- select the right hepatic artery. Repeat right hepatic arteriogram confirms position with very little residual arterial portal venous shunting.  Drug-eluting beads trans arterial chemo embolization: From this location, 150 mg of doxorubicin drug-eluting beads were  delivered predominately into the right hepatic artery. Minimal reflux into the left hepatic artery. Entire dose was able to be delivered. Post embolization angiographic confirms preserved patency of right hepatic artery with antegrade flow.  Minor catheter and C2 catheter were removed. Right common femoral artery access site was closed with a 5 [place] [employer] device. No immediate complication. Overall, patient tolerated the procedure well. She will be admitted for overnight observation.  IMPRESSION: Successful right and left hepatic artery bland embolizations with 700- 900 micron embospheres to decrease arterial portal venous shunting through the tumor vascularity.  Successful drug-eluting beads trans arterial chemo embolization predominantly to the right hepatic tumor vascularity, total dose administered 150 mg doxorubicin.   Electronically Signed   By: Daryll Brod M.D.   On: 11/25/2013 13:19   Ir US Guide Vasc Access Right  11/25/2013   CLINICAL DATA:  Multifocal diffuse hepatocellular carcinoma  EXAM: ULTRASOUND GUIDANCE FOR VASCULAR ACCESS  CELIAC ANGIOGRAM  COMMON HEPATIC, PROPER HEPATIC, RIGHT AND LEFT HEPATIC ANGIOGRAMS  RIGHT AND LEFT HEPATIC ARTERY BLAND EMBOLIZATIONS  RIGHT HEPATIC ARTERY DRUG-ELUTING BEADS TRANS ARTERIAL CHEMO EMBOLIZATION  Date:  11/3/201511/03/2013 11:52 am  Radiologist:  M. Daryll Brod, MD  Guidance:  Ultrasound and fluoroscopic  FLUOROSCOPY TIME:  12 min 36 seconds, 2,853 mGy  MEDICATIONS AND MEDICAL HISTORY: 4 mg versed, 200 mcg fentanyl, 20 mg Decadron, 4 mg Zofran, 3.375 g Zosyn administered within 1 hr of the procedure  ANESTHESIA/SEDATION: 1 hr 12 min  CONTRAST:  140 cc Omnipaque 254  COMPLICATIONS: None immediate  PROCEDURE: Informed consent was obtained from the patient following explanation of the procedure, risks, benefits and alternatives. The patient understands, agrees and consents for the procedure. All questions were addressed. A time out was performed.  Maximal  barrier sterile technique utilized including caps, mask, sterile gowns, sterile gloves, large sterile drape, hand hygiene, and Betadine.  Under sterile conditions and local anesthesia, ultrasound micropuncture access was performed of the right common femoral artery. Five French sheath inserted over a Bentson guidewire. C2 catheter utilized to select the celiac origin. Selective  celiac angiogram performed.  Celiac angiogram: Celiac origin is widely patent. Hepatic, gastroduodenal, and splenic vasculature are patent. Arterial portal venous shunting demonstrated through the diffuse hepatic tumor vascularity.  Micro catheter was utilized to select the common hepatic artery selective common hepatic angiogram performed. This again demonstrates patency of the right and left hepatic arteries. Diffuse hepatic tumor vascularity demonstrated with early arterial portal venous shunting.  Micro catheter utilized to select the right hepatic artery. Selective right hepatic angiogram also confirms arterial portal venous shunting with diffuse tumor vascularity.  Right hepatic artery Bland embolization: From this location, right hepatic artery bland embolization was performed with 1 vial of 700- 900 micron embospheres. This is in preparation for chemo embolization to decrease the arterial portal venous shunting prior to delivering drug-eluting beads. Following right hepatic artery bland embolization, there is significant reduction in the arterial portal venous shunting.  Next, the micro catheter was utilized to select the left hepatic artery. Selective left hepatic angiogram and bland embolization performed.  Left hepatic bland embolization: Left hepatic artery is widely patent with diffuse arterial tumor vascularity and portal venous shunting into the left portal vein. Bland embolization performed with 1 vial of 700 - 900 micron embospheres to decrease the arterial portal venous shunting.  Micro catheter was utilized to re- select  the right hepatic artery. Repeat right hepatic arteriogram confirms position with very little residual arterial portal venous shunting.  Drug-eluting beads trans arterial chemo embolization: From this location, 150 mg of doxorubicin drug-eluting beads were delivered predominately into the right hepatic artery. Minimal reflux into the left hepatic artery. Entire dose was able to be delivered. Post embolization angiographic confirms preserved patency of right hepatic artery with antegrade flow.  Minor catheter and C2 catheter were removed. Right common femoral artery access site was closed with a 5 [place] [employer] device. No immediate complication. Overall, patient tolerated the procedure well. She will be admitted for overnight observation.  IMPRESSION: Successful right and left hepatic artery bland embolizations with 700- 900 micron embospheres to decrease arterial portal venous shunting through the tumor vascularity.  Successful drug-eluting beads trans arterial chemo embolization predominantly to the right hepatic tumor vascularity, total dose administered 150 mg doxorubicin.   Electronically Signed   By: Daryll Brod M.D.   On: 11/25/2013 13:19   Ir Embo Tumor Organ Ischemia Infarct Inc Guide Roadmapping  11/25/2013   CLINICAL DATA:  Multifocal diffuse hepatocellular carcinoma  EXAM: ULTRASOUND GUIDANCE FOR VASCULAR ACCESS  CELIAC ANGIOGRAM  COMMON HEPATIC, PROPER HEPATIC, RIGHT AND LEFT HEPATIC ANGIOGRAMS  RIGHT AND LEFT HEPATIC ARTERY BLAND EMBOLIZATIONS  RIGHT HEPATIC ARTERY DRUG-ELUTING BEADS TRANS ARTERIAL CHEMO EMBOLIZATION  Date:  11/3/201511/03/2013 11:52 am  Radiologist:  M. Daryll Brod, MD  Guidance:  Ultrasound and fluoroscopic  FLUOROSCOPY TIME:  12 min 36 seconds, 2,853 mGy  MEDICATIONS AND MEDICAL HISTORY: 4 mg versed, 200 mcg fentanyl, 20 mg Decadron, 4 mg Zofran, 3.375 g Zosyn administered within 1 hr of the procedure  ANESTHESIA/SEDATION: 1 hr 12 min  CONTRAST:  140 cc Omnipaque 549   COMPLICATIONS: None immediate  PROCEDURE: Informed consent was obtained from the patient following explanation of the procedure, risks, benefits and alternatives. The patient understands, agrees and consents for the procedure. All questions were addressed. A time out was performed.  Maximal barrier sterile technique utilized including caps, mask, sterile gowns, sterile gloves, large sterile drape, hand hygiene, and Betadine.  Under sterile conditions and local anesthesia, ultrasound micropuncture access was performed of the right common femoral  artery. Five French sheath inserted over a Bentson guidewire. C2 catheter utilized to select the celiac origin. Selective celiac angiogram performed.  Celiac angiogram: Celiac origin is widely patent. Hepatic, gastroduodenal, and splenic vasculature are patent. Arterial portal venous shunting demonstrated through the diffuse hepatic tumor vascularity.  Micro catheter was utilized to select the common hepatic artery selective common hepatic angiogram performed. This again demonstrates patency of the right and left hepatic arteries. Diffuse hepatic tumor vascularity demonstrated with early arterial portal venous shunting.  Micro catheter utilized to select the right hepatic artery. Selective right hepatic angiogram also confirms arterial portal venous shunting with diffuse tumor vascularity.  Right hepatic artery Bland embolization: From this location, right hepatic artery bland embolization was performed with 1 vial of 700- 900 micron embospheres. This is in preparation for chemo embolization to decrease the arterial portal venous shunting prior to delivering drug-eluting beads. Following right hepatic artery bland embolization, there is significant reduction in the arterial portal venous shunting.  Next, the micro catheter was utilized to select the left hepatic artery. Selective left hepatic angiogram and bland embolization performed.  Left hepatic bland embolization: Left  hepatic artery is widely patent with diffuse arterial tumor vascularity and portal venous shunting into the left portal vein. Bland embolization performed with 1 vial of 700 - 900 micron embospheres to decrease the arterial portal venous shunting.  Micro catheter was utilized to re- select the right hepatic artery. Repeat right hepatic arteriogram confirms position with very little residual arterial portal venous shunting.  Drug-eluting beads trans arterial chemo embolization: From this location, 150 mg of doxorubicin drug-eluting beads were delivered predominately into the right hepatic artery. Minimal reflux into the left hepatic artery. Entire dose was able to be delivered. Post embolization angiographic confirms preserved patency of right hepatic artery with antegrade flow.  Minor catheter and C2 catheter were removed. Right common femoral artery access site was closed with a 5 [place] [employer] device. No immediate complication. Overall, patient tolerated the procedure well. She will be admitted for overnight observation.  IMPRESSION: Successful right and left hepatic artery bland embolizations with 700- 900 micron embospheres to decrease arterial portal venous shunting through the tumor vascularity.  Successful drug-eluting beads trans arterial chemo embolization predominantly to the right hepatic tumor vascularity, total dose administered 150 mg doxorubicin.   Electronically Signed   By: Daryll Brod M.D.   On: 11/25/2013 13:19   Labs:  CBC:  Recent Labs  09/17/13 0555 09/24/13 1632 11/25/13 0800 11/26/13 0850  WBC 9.0 7.7 8.1 13.7*  HGB 12.2 10.9* 10.4* 11.1*  HCT 35.0* 31.8* 31.9* 33.6*  PLT 228 426* 242 248   COAGS:  Recent Labs  07/21/13 1238 08/25/13 1111 09/16/13 0721 11/25/13 0800  INR 0.99 0.97 1.06 1.12  APTT  --  29 30 24    BMP:  Recent Labs  08/29/13 0750 09/16/13 0721 09/17/13 0555 09/24/13 1632 11/25/13 0800  NA 143 143 137 138 139  K 3.8 3.8 3.2* 3.9 4.3    CL 104 103 95* 100 101  CO2 26 28 25 25 24   GLUCOSE 103* 116* 84 95 142*  BUN 9 7 10 12 11   CALCIUM 9.3 9.1 9.3 8.9 9.0  CREATININE 0.66 0.50 0.55 0.79 0.72  GFRNONAA >90 >90 >90  --  >90  GFRAA >90 >90 >90  --  >90   LIVER FUNCTION TESTS:  Recent Labs  09/16/13 0721 09/17/13 0555 09/24/13 1632 11/25/13 0800  BILITOT 0.5 0.6 0.6 1.2  AST 103*  178* 45* 108*  ALT 78* 105* 40* 63*  ALKPHOS 181* 178* 139* 192*  PROT 7.8 7.9 7.6 7.7  ALBUMIN 3.7 3.6 3.9 3.2*   Assessment and Plan: 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

## 2013-11-26 NOTE — Progress Notes (Signed)
UR completed 

## 2013-11-27 ENCOUNTER — Encounter (HOSPITAL_COMMUNITY): Payer: Self-pay | Admitting: Gastroenterology

## 2013-11-27 DIAGNOSIS — K920 Hematemesis: Principal | ICD-10-CM

## 2013-11-27 DIAGNOSIS — E119 Type 2 diabetes mellitus without complications: Secondary | ICD-10-CM

## 2013-11-27 DIAGNOSIS — C220 Liver cell carcinoma: Secondary | ICD-10-CM

## 2013-11-27 DIAGNOSIS — I864 Gastric varices: Secondary | ICD-10-CM | POA: Diagnosis present

## 2013-11-27 DIAGNOSIS — R110 Nausea: Secondary | ICD-10-CM

## 2013-11-27 LAB — CBC
HCT: 31.4 % — ABNORMAL LOW (ref 36.0–46.0)
HEMOGLOBIN: 10.3 g/dL — AB (ref 12.0–15.0)
MCH: 29.4 pg (ref 26.0–34.0)
MCHC: 32.8 g/dL (ref 30.0–36.0)
MCV: 89.7 fL (ref 78.0–100.0)
Platelets: 249 10*3/uL (ref 150–400)
RBC: 3.5 MIL/uL — AB (ref 3.87–5.11)
RDW: 16.6 % — ABNORMAL HIGH (ref 11.5–15.5)
WBC: 9.4 10*3/uL (ref 4.0–10.5)

## 2013-11-27 LAB — COMPREHENSIVE METABOLIC PANEL
ALT: 112 U/L — AB (ref 0–35)
AST: 221 U/L — ABNORMAL HIGH (ref 0–37)
Albumin: 2.6 g/dL — ABNORMAL LOW (ref 3.5–5.2)
Alkaline Phosphatase: 160 U/L — ABNORMAL HIGH (ref 39–117)
Anion gap: 12 (ref 5–15)
BUN: 11 mg/dL (ref 6–23)
CALCIUM: 8.2 mg/dL — AB (ref 8.4–10.5)
CO2: 24 meq/L (ref 19–32)
CREATININE: 0.59 mg/dL (ref 0.50–1.10)
Chloride: 101 mEq/L (ref 96–112)
Glucose, Bld: 76 mg/dL (ref 70–99)
Potassium: 3.5 mEq/L — ABNORMAL LOW (ref 3.7–5.3)
SODIUM: 137 meq/L (ref 137–147)
TOTAL PROTEIN: 6.8 g/dL (ref 6.0–8.3)
Total Bilirubin: 1.4 mg/dL — ABNORMAL HIGH (ref 0.3–1.2)

## 2013-11-27 LAB — GLUCOSE, CAPILLARY: GLUCOSE-CAPILLARY: 138 mg/dL — AB (ref 70–99)

## 2013-11-27 MED ORDER — CEFAZOLIN SODIUM-DEXTROSE 2-3 GM-% IV SOLR
2.0000 g | INTRAVENOUS | Status: AC
  Administered 2013-11-28: 2 g via INTRAVENOUS
  Filled 2013-11-27: qty 50

## 2013-11-27 MED FILL — Medication: Qty: 1 | Status: AC

## 2013-11-27 NOTE — Progress Notes (Signed)
Patient ID: Whitney Patel, female   DOB: Feb 16, 1955, 58 y.o.   MRN: [id number]   Referring Physician(s): Dr. Alen Blew  Subjective:  Pt doing ok today ;denies any further hematemesis,N/V ; also denies CP, dyspnea, cough; still with RUQ/epigastric discomfort  Allergies: Review of patient's allergies indicates no known allergies.  Medications: Prior to Admission medications   Medication Sig Start Date End Date Taking? Authorizing Provider  amLODipine (NORVASC) 10 MG tablet Take 10 mg by mouth every morning.   Yes Historical Provider, MD  carvedilol (COREG) 12.5 MG tablet Take 6.25 mg by mouth 2 (two) times daily with a meal.   Yes Historical Provider, MD  hydrochlorothiazide (MICROZIDE) 12.5 MG capsule Take 1 capsule (12.5 mg total) by mouth 2 (two) times daily. 11/18/13  Yes Elberta Leatherwood, MD  lisinopril (PRINIVIL,ZESTRIL) 20 MG tablet Take 20 mg by mouth 2 (two) times daily.   Yes Historical Provider, MD  megestrol (MEGACE ES) 625 MG/5ML suspension Take 5 mLs (625 mg total) by mouth daily. 10/03/13   Wyatt Portela, MD  promethazine (PHENERGAN) 25 MG tablet Take 25 mg by mouth every 6 (six) hours as needed for nausea.  09/17/13   Historical Provider, MD    Review of Systems see above  Vital Signs: BP 110/56 mmHg  Pulse 74  Temp(Src) 99.4 F (37.4 C) (Oral)  Resp 20  Ht 5\' 5"  (1.651 m)  Wt 130 lb (58.968 kg)  BMI 21.63 kg/m2  SpO2 91%  Physical Exam pt awake/alert; chest- few bibasilar crackles; heart- RRR with 2/6 SEM; abd- soft,mod dist, hepatomegaly,  tender epigastric/RUQ regions, few BS; ext- FROM, no edema. Imaging: Ir Angiogram Visceral Selective  11/25/2013   CLINICAL DATA:  Multifocal diffuse hepatocellular carcinoma  EXAM: ULTRASOUND GUIDANCE FOR VASCULAR ACCESS  CELIAC ANGIOGRAM  COMMON HEPATIC, PROPER HEPATIC, RIGHT AND LEFT HEPATIC ANGIOGRAMS  RIGHT AND LEFT HEPATIC ARTERY BLAND EMBOLIZATIONS  RIGHT HEPATIC ARTERY DRUG-ELUTING BEADS TRANS ARTERIAL CHEMO EMBOLIZATION   Date:  11/3/201511/03/2013 11:52 am  Radiologist:  M. Daryll Brod, MD  Guidance:  Ultrasound and fluoroscopic  FLUOROSCOPY TIME:  12 min 36 seconds, 2,853 mGy  MEDICATIONS AND MEDICAL HISTORY: 4 mg versed, 200 mcg fentanyl, 20 mg Decadron, 4 mg Zofran, 3.375 g Zosyn administered within 1 hr of the procedure  ANESTHESIA/SEDATION: 1 hr 12 min  CONTRAST:  140 cc Omnipaque [id number]  COMPLICATIONS: None immediate  PROCEDURE: Informed consent was obtained from the patient following explanation of the procedure, risks, benefits and alternatives. The patient understands, agrees and consents for the procedure. All questions were addressed. A time out was performed.  Maximal barrier sterile technique utilized including caps, mask, sterile gowns, sterile gloves, large sterile drape, hand hygiene, and Betadine.  Under sterile conditions and local anesthesia, ultrasound micropuncture access was performed of the right common femoral artery. Five French sheath inserted over a Bentson guidewire. C2 catheter utilized to select the celiac origin. Selective celiac angiogram performed.  Celiac angiogram: Celiac origin is widely patent. Hepatic, gastroduodenal, and splenic vasculature are patent. Arterial portal venous shunting demonstrated through the diffuse hepatic tumor vascularity.  Micro catheter was utilized to select the common hepatic artery selective common hepatic angiogram performed. This again demonstrates patency of the right and left hepatic arteries. Diffuse hepatic tumor vascularity demonstrated with early arterial portal venous shunting.  Micro catheter utilized to select the right hepatic artery. Selective right hepatic angiogram also confirms arterial portal venous shunting with diffuse tumor vascularity.  Right hepatic artery Bland embolization: From this location,  right hepatic artery bland embolization was performed with 1 vial of 700- 900 micron embospheres. This is in preparation for chemo embolization to decrease  the arterial portal venous shunting prior to delivering drug-eluting beads. Following right hepatic artery bland embolization, there is significant reduction in the arterial portal venous shunting.  Next, the micro catheter was utilized to select the left hepatic artery. Selective left hepatic angiogram and bland embolization performed.  Left hepatic bland embolization: Left hepatic artery is widely patent with diffuse arterial tumor vascularity and portal venous shunting into the left portal vein. Bland embolization performed with 1 vial of 700 - 900 micron embospheres to decrease the arterial portal venous shunting.  Micro catheter was utilized to re- select the right hepatic artery. Repeat right hepatic arteriogram confirms position with very little residual arterial portal venous shunting.  Drug-eluting beads trans arterial chemo embolization: From this location, 150 mg of doxorubicin drug-eluting beads were delivered predominately into the right hepatic artery. Minimal reflux into the left hepatic artery. Entire dose was able to be delivered. Post embolization angiographic confirms preserved patency of right hepatic artery with antegrade flow.  Minor catheter and C2 catheter were removed. Right common femoral artery access site was closed with a 5 [place] Star[employer] device. No immediate complication. Overall, patient tolerated the procedure well. She will be admitted for overnight observation.  IMPRESSION: Successful right and left hepatic artery bland embolizations with 700- 900 micron embospheres to decrease arterial portal venous shunting through the tumor vascularity.  Successful drug-eluting beads trans arterial chemo embolization predominantly to the right hepatic tumor vascularity, total dose administered 150 mg doxorubicin.   Electronically Signed   By: Daryll Brod M.D.   On: 11/25/2013 13:19   Ir Angiogram Selective Each Additional Vessel  11/25/2013   CLINICAL DATA:  Multifocal diffuse  hepatocellular carcinoma  EXAM: ULTRASOUND GUIDANCE FOR VASCULAR ACCESS  CELIAC ANGIOGRAM  COMMON HEPATIC, PROPER HEPATIC, RIGHT AND LEFT HEPATIC ANGIOGRAMS  RIGHT AND LEFT HEPATIC ARTERY BLAND EMBOLIZATIONS  RIGHT HEPATIC ARTERY DRUG-ELUTING BEADS TRANS ARTERIAL CHEMO EMBOLIZATION  Date:  11/3/201511/03/2013 11:52 am  Radiologist:  M. Daryll Brod, MD  Guidance:  Ultrasound and fluoroscopic  FLUOROSCOPY TIME:  12 min 36 seconds, 2,853 mGy  MEDICATIONS AND MEDICAL HISTORY: 4 mg versed, 200 mcg fentanyl, 20 mg Decadron, 4 mg Zofran, 3.375 g Zosyn administered within 1 hr of the procedure  ANESTHESIA/SEDATION: 1 hr 12 min  CONTRAST:  140 cc Omnipaque [id number]  COMPLICATIONS: None immediate  PROCEDURE: Informed consent was obtained from the patient following explanation of the procedure, risks, benefits and alternatives. The patient understands, agrees and consents for the procedure. All questions were addressed. A time out was performed.  Maximal barrier sterile technique utilized including caps, mask, sterile gowns, sterile gloves, large sterile drape, hand hygiene, and Betadine.  Under sterile conditions and local anesthesia, ultrasound micropuncture access was performed of the right common femoral artery. Five French sheath inserted over a Bentson guidewire. C2 catheter utilized to select the celiac origin. Selective celiac angiogram performed.  Celiac angiogram: Celiac origin is widely patent. Hepatic, gastroduodenal, and splenic vasculature are patent. Arterial portal venous shunting demonstrated through the diffuse hepatic tumor vascularity.  Micro catheter was utilized to select the common hepatic artery selective common hepatic angiogram performed. This again demonstrates patency of the right and left hepatic arteries. Diffuse hepatic tumor vascularity demonstrated with early arterial portal venous shunting.  Micro catheter utilized to select the right hepatic artery. Selective right hepatic angiogram also confirms  arterial  portal venous shunting with diffuse tumor vascularity.  Right hepatic artery Bland embolization: From this location, right hepatic artery bland embolization was performed with 1 vial of 700- 900 micron embospheres. This is in preparation for chemo embolization to decrease the arterial portal venous shunting prior to delivering drug-eluting beads. Following right hepatic artery bland embolization, there is significant reduction in the arterial portal venous shunting.  Next, the micro catheter was utilized to select the left hepatic artery. Selective left hepatic angiogram and bland embolization performed.  Left hepatic bland embolization: Left hepatic artery is widely patent with diffuse arterial tumor vascularity and portal venous shunting into the left portal vein. Bland embolization performed with 1 vial of 700 - 900 micron embospheres to decrease the arterial portal venous shunting.  Micro catheter was utilized to re- select the right hepatic artery. Repeat right hepatic arteriogram confirms position with very little residual arterial portal venous shunting.  Drug-eluting beads trans arterial chemo embolization: From this location, 150 mg of doxorubicin drug-eluting beads were delivered predominately into the right hepatic artery. Minimal reflux into the left hepatic artery. Entire dose was able to be delivered. Post embolization angiographic confirms preserved patency of right hepatic artery with antegrade flow.  Minor catheter and C2 catheter were removed. Right common femoral artery access site was closed with a 5 [place] [employer] device. No immediate complication. Overall, patient tolerated the procedure well. She will be admitted for overnight observation.  IMPRESSION: Successful right and left hepatic artery bland embolizations with 700- 900 micron embospheres to decrease arterial portal venous shunting through the tumor vascularity.  Successful drug-eluting beads trans arterial chemo embolization  predominantly to the right hepatic tumor vascularity, total dose administered 150 mg doxorubicin.   Electronically Signed   By: Daryll Brod M.D.   On: 11/25/2013 13:19   Ir Angiogram Selective Each Additional Vessel  11/25/2013   CLINICAL DATA:  Multifocal diffuse hepatocellular carcinoma  EXAM: ULTRASOUND GUIDANCE FOR VASCULAR ACCESS  CELIAC ANGIOGRAM  COMMON HEPATIC, PROPER HEPATIC, RIGHT AND LEFT HEPATIC ANGIOGRAMS  RIGHT AND LEFT HEPATIC ARTERY BLAND EMBOLIZATIONS  RIGHT HEPATIC ARTERY DRUG-ELUTING BEADS TRANS ARTERIAL CHEMO EMBOLIZATION  Date:  11/3/201511/03/2013 11:52 am  Radiologist:  M. Daryll Brod, MD  Guidance:  Ultrasound and fluoroscopic  FLUOROSCOPY TIME:  12 min 36 seconds, 2,853 mGy  MEDICATIONS AND MEDICAL HISTORY: 4 mg versed, 200 mcg fentanyl, 20 mg Decadron, 4 mg Zofran, 3.375 g Zosyn administered within 1 hr of the procedure  ANESTHESIA/SEDATION: 1 hr 12 min  CONTRAST:  140 cc Omnipaque [id number]  COMPLICATIONS: None immediate  PROCEDURE: Informed consent was obtained from the patient following explanation of the procedure, risks, benefits and alternatives. The patient understands, agrees and consents for the procedure. All questions were addressed. A time out was performed.  Maximal barrier sterile technique utilized including caps, mask, sterile gowns, sterile gloves, large sterile drape, hand hygiene, and Betadine.  Under sterile conditions and local anesthesia, ultrasound micropuncture access was performed of the right common femoral artery. Five French sheath inserted over a Bentson guidewire. C2 catheter utilized to select the celiac origin. Selective celiac angiogram performed.  Celiac angiogram: Celiac origin is widely patent. Hepatic, gastroduodenal, and splenic vasculature are patent. Arterial portal venous shunting demonstrated through the diffuse hepatic tumor vascularity.  Micro catheter was utilized to select the common hepatic artery selective common hepatic angiogram performed.  This again demonstrates patency of the right and left hepatic arteries. Diffuse hepatic tumor vascularity demonstrated with early arterial portal venous shunting.  Micro  catheter utilized to select the right hepatic artery. Selective right hepatic angiogram also confirms arterial portal venous shunting with diffuse tumor vascularity.  Right hepatic artery Bland embolization: From this location, right hepatic artery bland embolization was performed with 1 vial of 700- 900 micron embospheres. This is in preparation for chemo embolization to decrease the arterial portal venous shunting prior to delivering drug-eluting beads. Following right hepatic artery bland embolization, there is significant reduction in the arterial portal venous shunting.  Next, the micro catheter was utilized to select the left hepatic artery. Selective left hepatic angiogram and bland embolization performed.  Left hepatic bland embolization: Left hepatic artery is widely patent with diffuse arterial tumor vascularity and portal venous shunting into the left portal vein. Bland embolization performed with 1 vial of 700 - 900 micron embospheres to decrease the arterial portal venous shunting.  Micro catheter was utilized to re- select the right hepatic artery. Repeat right hepatic arteriogram confirms position with very little residual arterial portal venous shunting.  Drug-eluting beads trans arterial chemo embolization: From this location, 150 mg of doxorubicin drug-eluting beads were delivered predominately into the right hepatic artery. Minimal reflux into the left hepatic artery. Entire dose was able to be delivered. Post embolization angiographic confirms preserved patency of right hepatic artery with antegrade flow.  Minor catheter and C2 catheter were removed. Right common femoral artery access site was closed with a 5 [place] [employer] device. No immediate complication. Overall, patient tolerated the procedure well. She will be admitted  for overnight observation.  IMPRESSION: Successful right and left hepatic artery bland embolizations with 700- 900 micron embospheres to decrease arterial portal venous shunting through the tumor vascularity.  Successful drug-eluting beads trans arterial chemo embolization predominantly to the right hepatic tumor vascularity, total dose administered 150 mg doxorubicin.   Electronically Signed   By: Daryll Brod M.D.   On: 11/25/2013 13:19   Ir Angiogram Selective Each Additional Vessel  11/25/2013   CLINICAL DATA:  Multifocal diffuse hepatocellular carcinoma  EXAM: ULTRASOUND GUIDANCE FOR VASCULAR ACCESS  CELIAC ANGIOGRAM  COMMON HEPATIC, PROPER HEPATIC, RIGHT AND LEFT HEPATIC ANGIOGRAMS  RIGHT AND LEFT HEPATIC ARTERY BLAND EMBOLIZATIONS  RIGHT HEPATIC ARTERY DRUG-ELUTING BEADS TRANS ARTERIAL CHEMO EMBOLIZATION  Date:  11/3/201511/03/2013 11:52 am  Radiologist:  M. Daryll Brod, MD  Guidance:  Ultrasound and fluoroscopic  FLUOROSCOPY TIME:  12 min 36 seconds, 2,853 mGy  MEDICATIONS AND MEDICAL HISTORY: 4 mg versed, 200 mcg fentanyl, 20 mg Decadron, 4 mg Zofran, 3.375 g Zosyn administered within 1 hr of the procedure  ANESTHESIA/SEDATION: 1 hr 12 min  CONTRAST:  140 cc Omnipaque [id number]  COMPLICATIONS: None immediate  PROCEDURE: Informed consent was obtained from the patient following explanation of the procedure, risks, benefits and alternatives. The patient understands, agrees and consents for the procedure. All questions were addressed. A time out was performed.  Maximal barrier sterile technique utilized including caps, mask, sterile gowns, sterile gloves, large sterile drape, hand hygiene, and Betadine.  Under sterile conditions and local anesthesia, ultrasound micropuncture access was performed of the right common femoral artery. Five French sheath inserted over a Bentson guidewire. C2 catheter utilized to select the celiac origin. Selective celiac angiogram performed.  Celiac angiogram: Celiac origin is widely  patent. Hepatic, gastroduodenal, and splenic vasculature are patent. Arterial portal venous shunting demonstrated through the diffuse hepatic tumor vascularity.  Micro catheter was utilized to select the common hepatic artery selective common hepatic angiogram performed. This again demonstrates patency of the right and left  hepatic arteries. Diffuse hepatic tumor vascularity demonstrated with early arterial portal venous shunting.  Micro catheter utilized to select the right hepatic artery. Selective right hepatic angiogram also confirms arterial portal venous shunting with diffuse tumor vascularity.  Right hepatic artery Bland embolization: From this location, right hepatic artery bland embolization was performed with 1 vial of 700- 900 micron embospheres. This is in preparation for chemo embolization to decrease the arterial portal venous shunting prior to delivering drug-eluting beads. Following right hepatic artery bland embolization, there is significant reduction in the arterial portal venous shunting.  Next, the micro catheter was utilized to select the left hepatic artery. Selective left hepatic angiogram and bland embolization performed.  Left hepatic bland embolization: Left hepatic artery is widely patent with diffuse arterial tumor vascularity and portal venous shunting into the left portal vein. Bland embolization performed with 1 vial of 700 - 900 micron embospheres to decrease the arterial portal venous shunting.  Micro catheter was utilized to re- select the right hepatic artery. Repeat right hepatic arteriogram confirms position with very little residual arterial portal venous shunting.  Drug-eluting beads trans arterial chemo embolization: From this location, 150 mg of doxorubicin drug-eluting beads were delivered predominately into the right hepatic artery. Minimal reflux into the left hepatic artery. Entire dose was able to be delivered. Post embolization angiographic confirms preserved patency  of right hepatic artery with antegrade flow.  Minor catheter and C2 catheter were removed. Right common femoral artery access site was closed with a 5 [place] [employer] device. No immediate complication. Overall, patient tolerated the procedure well. She will be admitted for overnight observation.  IMPRESSION: Successful right and left hepatic artery bland embolizations with 700- 900 micron embospheres to decrease arterial portal venous shunting through the tumor vascularity.  Successful drug-eluting beads trans arterial chemo embolization predominantly to the right hepatic tumor vascularity, total dose administered 150 mg doxorubicin.   Electronically Signed   By: Daryll Brod M.D.   On: 11/25/2013 13:19   Ir US Guide Vasc Access Right  11/25/2013   CLINICAL DATA:  Multifocal diffuse hepatocellular carcinoma  EXAM: ULTRASOUND GUIDANCE FOR VASCULAR ACCESS  CELIAC ANGIOGRAM  COMMON HEPATIC, PROPER HEPATIC, RIGHT AND LEFT HEPATIC ANGIOGRAMS  RIGHT AND LEFT HEPATIC ARTERY BLAND EMBOLIZATIONS  RIGHT HEPATIC ARTERY DRUG-ELUTING BEADS TRANS ARTERIAL CHEMO EMBOLIZATION  Date:  11/3/201511/03/2013 11:52 am  Radiologist:  M. Daryll Brod, MD  Guidance:  Ultrasound and fluoroscopic  FLUOROSCOPY TIME:  12 min 36 seconds, 2,853 mGy  MEDICATIONS AND MEDICAL HISTORY: 4 mg versed, 200 mcg fentanyl, 20 mg Decadron, 4 mg Zofran, 3.375 g Zosyn administered within 1 hr of the procedure  ANESTHESIA/SEDATION: 1 hr 12 min  CONTRAST:  140 cc Omnipaque [id number]  COMPLICATIONS: None immediate  PROCEDURE: Informed consent was obtained from the patient following explanation of the procedure, risks, benefits and alternatives. The patient understands, agrees and consents for the procedure. All questions were addressed. A time out was performed.  Maximal barrier sterile technique utilized including caps, mask, sterile gowns, sterile gloves, large sterile drape, hand hygiene, and Betadine.  Under sterile conditions and local anesthesia, ultrasound  micropuncture access was performed of the right common femoral artery. Five French sheath inserted over a Bentson guidewire. C2 catheter utilized to select the celiac origin. Selective celiac angiogram performed.  Celiac angiogram: Celiac origin is widely patent. Hepatic, gastroduodenal, and splenic vasculature are patent. Arterial portal venous shunting demonstrated through the diffuse hepatic tumor vascularity.  Micro catheter was utilized to select the common hepatic  artery selective common hepatic angiogram performed. This again demonstrates patency of the right and left hepatic arteries. Diffuse hepatic tumor vascularity demonstrated with early arterial portal venous shunting.  Micro catheter utilized to select the right hepatic artery. Selective right hepatic angiogram also confirms arterial portal venous shunting with diffuse tumor vascularity.  Right hepatic artery Bland embolization: From this location, right hepatic artery bland embolization was performed with 1 vial of 700- 900 micron embospheres. This is in preparation for chemo embolization to decrease the arterial portal venous shunting prior to delivering drug-eluting beads. Following right hepatic artery bland embolization, there is significant reduction in the arterial portal venous shunting.  Next, the micro catheter was utilized to select the left hepatic artery. Selective left hepatic angiogram and bland embolization performed.  Left hepatic bland embolization: Left hepatic artery is widely patent with diffuse arterial tumor vascularity and portal venous shunting into the left portal vein. Bland embolization performed with 1 vial of 700 - 900 micron embospheres to decrease the arterial portal venous shunting.  Micro catheter was utilized to re- select the right hepatic artery. Repeat right hepatic arteriogram confirms position with very little residual arterial portal venous shunting.  Drug-eluting beads trans arterial chemo embolization: From  this location, 150 mg of doxorubicin drug-eluting beads were delivered predominately into the right hepatic artery. Minimal reflux into the left hepatic artery. Entire dose was able to be delivered. Post embolization angiographic confirms preserved patency of right hepatic artery with antegrade flow.  Minor catheter and C2 catheter were removed. Right common femoral artery access site was closed with a 5 [place] [employer] device. No immediate complication. Overall, patient tolerated the procedure well. She will be admitted for overnight observation.  IMPRESSION: Successful right and left hepatic artery bland embolizations with 700- 900 micron embospheres to decrease arterial portal venous shunting through the tumor vascularity.  Successful drug-eluting beads trans arterial chemo embolization predominantly to the right hepatic tumor vascularity, total dose administered 150 mg doxorubicin.   Electronically Signed   By: Daryll Brod M.D.   On: 11/25/2013 13:19   Dg Chest Port 1 View  11/26/2013   CLINICAL DATA:  Initial encounter for hypoxia after hepatic artery embolization yesterday.  EXAM: PORTABLE CHEST - 1 VIEW  COMPARISON:  01/16/2013.  FINDINGS: Seventeen. Lung volumes are low. There is vascular congestion with patchy bilateral interstitial and airspace opacity, left greater than right. Possible tiny left pleural effusion. Cardiopericardial silhouette is enlarged. Imaged bony structures of the thorax are intact.  IMPRESSION: Vascular congestion with patchy airspace disease, left greater than right. Asymmetric edema or diffuse infection would be a consideration.  Enlargement of the cardiopericardial silhouette.   Electronically Signed   By: Misty Stanley M.D.   On: 11/26/2013 17:25   Ir Embo Tumor Organ Ischemia Infarct Inc Guide Roadmapping  11/25/2013   CLINICAL DATA:  Multifocal diffuse hepatocellular carcinoma  EXAM: ULTRASOUND GUIDANCE FOR VASCULAR ACCESS  CELIAC ANGIOGRAM  COMMON HEPATIC, PROPER  HEPATIC, RIGHT AND LEFT HEPATIC ANGIOGRAMS  RIGHT AND LEFT HEPATIC ARTERY BLAND EMBOLIZATIONS  RIGHT HEPATIC ARTERY DRUG-ELUTING BEADS TRANS ARTERIAL CHEMO EMBOLIZATION  Date:  11/3/201511/03/2013 11:52 am  Radiologist:  M. Daryll Brod, MD  Guidance:  Ultrasound and fluoroscopic  FLUOROSCOPY TIME:  12 min 36 seconds, 2,853 mGy  MEDICATIONS AND MEDICAL HISTORY: 4 mg versed, 200 mcg fentanyl, 20 mg Decadron, 4 mg Zofran, 3.375 g Zosyn administered within 1 hr of the procedure  ANESTHESIA/SEDATION: 1 hr 12 min  CONTRAST:  140 cc Omnipaque [id number]  COMPLICATIONS: None immediate  PROCEDURE: Informed consent was obtained from the patient following explanation of the procedure, risks, benefits and alternatives. The patient understands, agrees and consents for the procedure. All questions were addressed. A time out was performed.  Maximal barrier sterile technique utilized including caps, mask, sterile gowns, sterile gloves, large sterile drape, hand hygiene, and Betadine.  Under sterile conditions and local anesthesia, ultrasound micropuncture access was performed of the right common femoral artery. Five French sheath inserted over a Bentson guidewire. C2 catheter utilized to select the celiac origin. Selective celiac angiogram performed.  Celiac angiogram: Celiac origin is widely patent. Hepatic, gastroduodenal, and splenic vasculature are patent. Arterial portal venous shunting demonstrated through the diffuse hepatic tumor vascularity.  Micro catheter was utilized to select the common hepatic artery selective common hepatic angiogram performed. This again demonstrates patency of the right and left hepatic arteries. Diffuse hepatic tumor vascularity demonstrated with early arterial portal venous shunting.  Micro catheter utilized to select the right hepatic artery. Selective right hepatic angiogram also confirms arterial portal venous shunting with diffuse tumor vascularity.  Right hepatic artery Bland embolization: From  this location, right hepatic artery bland embolization was performed with 1 vial of 700- 900 micron embospheres. This is in preparation for chemo embolization to decrease the arterial portal venous shunting prior to delivering drug-eluting beads. Following right hepatic artery bland embolization, there is significant reduction in the arterial portal venous shunting.  Next, the micro catheter was utilized to select the left hepatic artery. Selective left hepatic angiogram and bland embolization performed.  Left hepatic bland embolization: Left hepatic artery is widely patent with diffuse arterial tumor vascularity and portal venous shunting into the left portal vein. Bland embolization performed with 1 vial of 700 - 900 micron embospheres to decrease the arterial portal venous shunting.  Micro catheter was utilized to re- select the right hepatic artery. Repeat right hepatic arteriogram confirms position with very little residual arterial portal venous shunting.  Drug-eluting beads trans arterial chemo embolization: From this location, 150 mg of doxorubicin drug-eluting beads were delivered predominately into the right hepatic artery. Minimal reflux into the left hepatic artery. Entire dose was able to be delivered. Post embolization angiographic confirms preserved patency of right hepatic artery with antegrade flow.  Minor catheter and C2 catheter were removed. Right common femoral artery access site was closed with a 5 [place] Star[employer] device. No immediate complication. Overall, patient tolerated the procedure well. She will be admitted for overnight observation.  IMPRESSION: Successful right and left hepatic artery bland embolizations with 700- 900 micron embospheres to decrease arterial portal venous shunting through the tumor vascularity.  Successful drug-eluting beads trans arterial chemo embolization predominantly to the right hepatic tumor vascularity, total dose administered 150 mg doxorubicin.    Electronically Signed   By: Daryll Brod M.D.   On: 11/25/2013 13:19    Labs:  CBC:  Recent Labs  09/24/13 1632 11/25/13 0800 11/26/13 0850 11/26/13 1230 11/27/13 0430  WBC 7.7 8.1 13.7*  --  9.4  HGB 10.9* 10.4* 11.1* 11.0* 10.3*  HCT 31.8* 31.9* 33.6* 33.3* 31.4*  PLT 426* 242 248  --  249    COAGS:  Recent Labs  07/21/13 1238 08/25/13 1111 09/16/13 0721 11/25/13 0800  INR 0.99 0.97 1.06 1.12  APTT  --  29 30 24     BMP:  Recent Labs  09/17/13 0555 09/24/13 1632 11/25/13 0800 11/26/13 0850 11/27/13 0430  NA 137 138 139 135* 137  K 3.2* 3.9 4.3  5.7* 3.5*  CL 95* 100 101 99 101  CO2 25 25 24 21 24   GLUCOSE 84 95 142* 108* 76  BUN 10 12 11 12 11   CALCIUM 9.3 8.9 9.0 8.9 8.2*  CREATININE 0.55 0.79 0.72 0.53 0.59  GFRNONAA >90  --  >90 >90 >90  GFRAA >90  --  >90 >90 >90    LIVER FUNCTION TESTS:  Recent Labs  09/24/13 1632 11/25/13 0800 11/26/13 0850 11/27/13 0430  BILITOT 0.6 1.2 1.3* 1.4*  AST 45* 108* 228* 221*  ALT 40* 63* 95* 112*  ALKPHOS 139* 192* 174* 160*  PROT 7.6 7.7 8.0 6.8  ALBUMIN 3.9 3.2* 3.0* 2.6*    Assessment and Plan: Pt with hx multifocal HCC, cirrhosis, hep C, portal hypertensive gastropathy; s/p repeat bland hepatic arterial embolization/DEB-TACE 11/3; now with recent hematemesis and evidence of gastric varices/clot on EGD 11/4. Pt not TIPS candidate due to [facility]; however, she is candidate for BRTO (balloon occluded retrograde transvenous obliteration) to treat symptomatic gastric varices. Case has been reviewed by Dr. Laurence Ferrari and d/w Drs  Mann/Krishnan . Details/risks of procedure (including but not limited to infection, internal bleeding/variceal rupture, liver failure, worsening ascites) d/w pt with her understanding and consent. Tent plan is to perform BRTO (via conscious sedation) at [facility] on 11/6 at 0800. Pt will be transferred to [facility] tonight in preparation for case. She currently has mild temp elevation (100) and is on  antbx therapy. Will cont to monitor closely.       I spent a total of 15 minutes face to face in clinical consultation/evaluation, greater than 50% of which was counseling/coordinating care for BRTO  Signed: ALLRED,D KEVIN 11/27/2013, 3:16 PM   I saw and examined the patient and agree with the PA note above.  The risks, benefits and alternatives were again reviewed and she voiced her understanding and desire to proceed.   Signed,  Criselda Peaches, MD

## 2013-11-27 NOTE — Plan of Care (Signed)
Problem: Phase I Progression Outcomes Goal: Pain controlled with appropriate interventions Outcome: Completed/Met Date Met:  11/27/13 Goal: OOB as tolerated unless otherwise ordered Outcome: Completed/Met Date Met:  11/27/13 Goal: Initial discharge plan identified Outcome: Progressing Goal: Voiding-avoid urinary catheter unless indicated Outcome: Completed/Met Date Met:  11/27/13 Goal: Hemodynamically stable Outcome: Progressing Goal: Other Phase I Outcomes/Goals Outcome: Progressing

## 2013-11-27 NOTE — Care Management Note (Signed)
CARE MANAGEMENT NOTE 11/27/2013  Patient:  NICHOL, ATOR   Account Number:  [id number]  Date Initiated:  11/27/2013  Documentation initiated by:  Marney Doctor  Subjective/Objective Assessment:   58 yo admitted with Hepatocellular carcinoma with Hematemesis.  HX of HTN and DM, Hep C with cirrhosis.     Action/Plan:   From home with children   Anticipated DC Date:  11/30/2013   Anticipated DC Plan:  HOME/SELF CARE         Choice offered to / List presented to:             Status of service:  In process, will continue to follow Medicare Important Message given?   (If response is "NO", the following Medicare IM given date fields will be blank) Date Medicare IM given:   Medicare IM given by:   Date Additional Medicare IM given:   Additional Medicare IM given by:    Discharge Disposition:    Per UR Regulation:  Reviewed for med. necessity/level of care/duration of stay  If discussed at [place] of Stay Meetings, dates discussed:    Comments:  11/27/13 Marney Doctor RN,BSN,NCM [phone] Chart reviewed and CM following for DC needs.

## 2013-11-27 NOTE — Progress Notes (Signed)
[facility] PROGRESS NOTE  Whitney Patel VEH:[id number] DOB: 1955/05/26 DOA: 11/25/2013  PCP: Elberta Leatherwood, MD  Brief HPI: The patient is a 58 y.o. year-old female with history of hepatitis C with cirrhosis and stage IIIA HCC, DM, HTN.She completed combined bland embolization and DEB/TACE on 09/16/2013 and had since recovered.Imaging demonstrated less enhancement of the multifocal tumor burden in the right hepatic lobe with areas of central necrosis. She presented on 11/3 for repeat hepatic DEB/TACE/bland embolization. She is followed by Dr.'s Alen Blew and [facility]. She tolerated her procedure well, however, postprocedure, she developed hematemesis and was vomiting approximately 50-75 mL's of bright red blood mixed with water.She underwent EGD on 11/4.  Past medical history:  Past Medical History  Diagnosis Date  . Hypertension   . Hepatocellular carcinoma   . Diabetes mellitus   . Hepatitis C     Consultants: IR, Dr. Collene Mares  Procedures:  DEB/TACE/bland embolization 11/3  EGD 11/4 IMPRESSION:  1) Widely patent esophagus with no varices or source of bleeding identified. 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 3) Portal hypertensive gastropathy was noted throughout the stomach. 4) The duodenal mucosa showed no abnormalities.  2D ECHO 11/4 Study Conclusions - Left ventricle: The cavity size was normal. Systolic function was normal. The estimated ejection fraction was in the range of 60% to 65%. Wall motion was normal; there were no regional wallmotion abnormalities. Doppler parameters are consistent withabnormal left ventricular relaxation (grade 1 diastolicdysfunction). - Mitral valve: There was mild regurgitation. - Left atrium: The atrium was mildly dilated. - Right ventricle: The cavity size was mildly dilated. Wall thickness was normal. - Right atrium: The atrium was moderately dilated. - Tricuspid valve: There was moderate regurgitation. - Pulmonary  arteries: Systolic pressure was moderately to severelyincreased. PA peak pressure: 69 mm Hg (S).  Antibiotics: Ceftriaxone and Azithromycin 11/4-->  Subjective: Patient feels well this morning. Denies any pain. No further bloody emesis. No black stools or blood in stool.  Objective: Vital Signs  Filed Vitals:   11/26/13 1630 11/26/13 1635 11/27/13 0042 11/27/13 0654  BP: 110/67 106/60 109/69 110/56  Pulse: 71 70 72 74  Temp:   98.9 F (37.2 C) 99.4 F (37.4 C)  TempSrc:   Oral Oral  Resp: 18 17 20 20   Height:      Weight:      SpO2: 95% 95% 90% 91%    Intake/Output Summary (Last 24 hours) at 11/27/13 1411 Last data filed at 11/27/13 0650  Gross per 24 hour  Intake   3282 ml  Output   1320 ml  Net   1962 ml   Filed Weights   11/25/13 1230  Weight: 58.968 kg (130 lb)    General appearance: alert, cooperative and no distress Resp: clear to auscultation bilaterally Cardio: regular rate and rhythm, S1, S2 normal, no murmur, click, rub or gallop GI: soft, non-tender; bowel sounds normal; no masses,  no organomegaly Neurologic: Alert and oriented X 3, normal strength and tone. Normal symmetric reflexes. Normal coordination  Lab Results:  Basic Metabolic Panel:  Recent Labs Lab 11/25/13 0800 11/26/13 0850 11/27/13 0430  NA 139 135* 137  K 4.3 5.7* 3.5*  CL 101 99 101  CO2 24 21 24   GLUCOSE 142* 108* 76  BUN 11 12 11   CREATININE 0.72 0.53 0.59  CALCIUM 9.0 8.9 8.2*   Liver Function Tests:  Recent Labs Lab 11/25/13 0800 11/26/13 0850 11/27/13 0430  AST 108* 228* 221*  ALT 63* 95*  112*  ALKPHOS 192* 174* 160*  BILITOT 1.2 1.3* 1.4*  PROT 7.7 8.0 6.8  ALBUMIN 3.2* 3.0* 2.6*   CBC:  Recent Labs Lab 11/25/13 0800 11/26/13 0850 11/26/13 1230 11/27/13 0430  WBC 8.1 13.7*  --  9.4  NEUTROABS 5.4  --   --   --   HGB 10.4* 11.1* 11.0* 10.3*  HCT 31.9* 33.6* 33.3* 31.4*  MCV 90.1 90.1  --  89.7  PLT 242 248  --  249   CBG:  Recent Labs Lab  11/25/13 0814 11/26/13 1837 11/27/13 0035  GLUCAP 137* 148* 138*    Recent Results (from the past 240 hour(s))  Culture, blood (routine x 2)     Status: None (Preliminary result)   Collection Time: 11/26/13  6:46 PM  Result Value Ref Range Status   Specimen Description BLOOD RIGHT ARM  Final   Special Requests BOTTLES DRAWN AEROBIC AND ANAEROBIC 10CC  Final   Culture  Setup Time   Final    11/26/2013 22:24 Performed at [facility]    Culture   Final           BLOOD CULTURE RECEIVED NO GROWTH TO DATE CULTURE WILL BE HELD FOR 5 DAYS BEFORE ISSUING A FINAL NEGATIVE REPORT Performed at [facility]    Report Status PENDING  Incomplete  Culture, blood (routine x 2)     Status: None (Preliminary result)   Collection Time: 11/26/13  6:53 PM  Result Value Ref Range Status   Specimen Description BLOOD RIGHT HAND  Final   Special Requests BOTTLES DRAWN AEROBIC AND ANAEROBIC 10CC  Final   Culture  Setup Time   Final    11/26/2013 22:24 Performed at [facility]    Culture   Final           BLOOD CULTURE RECEIVED NO GROWTH TO DATE CULTURE WILL BE HELD FOR 5 DAYS BEFORE ISSUING A FINAL NEGATIVE REPORT Performed at [facility]    Report Status PENDING  Incomplete      Studies/Results: Dg Chest Port 1 View  11/26/2013   CLINICAL DATA:  Initial encounter for hypoxia after hepatic artery embolization yesterday.  EXAM: PORTABLE CHEST - 1 VIEW  COMPARISON:  01/16/2013.  FINDINGS: Seventeen. Lung volumes are low. There is vascular congestion with patchy bilateral interstitial and airspace opacity, left greater than right. Possible tiny left pleural effusion. Cardiopericardial silhouette is enlarged. Imaged bony structures of the thorax are intact.  IMPRESSION: Vascular congestion with patchy airspace disease, left greater than right. Asymmetric edema or diffuse infection would be a consideration.  Enlargement of the cardiopericardial silhouette.   Electronically  Signed   By: Misty Stanley M.D.   On: 11/26/2013 17:25    Medications:  Scheduled: . amLODipine  10 mg Oral q morning - 10a  . azithromycin  500 mg Intravenous Q24H  . carvedilol  6.25 mg Oral BID WC  . cefTRIAXone (ROCEPHIN)  IV  1 g Intravenous Q24H  . docusate sodium  100 mg Oral BID  . insulin aspart  0-9 Units Subcutaneous 4 times per day  . [START ON 11/29/2013] pantoprazole (PROTONIX) IV  40 mg Intravenous Q12H  . sodium chloride  3 mL Intravenous Q12H   Continuous: . sodium chloride 75 mL/hr at 11/27/13 0200  . sodium chloride 500 mL (11/26/13 1543)  . sodium chloride    . sodium chloride    . octreotide  (SANDOSTATIN)    IV infusion 50 mcg/hr (  11/27/13 1142)   NOM:VEHMCN chloride, HYDROcodone-acetaminophen, ondansetron (ZOFRAN) IV, promethazine **OR** promethazine, sodium chloride  Assessment/Plan:  Principal Problem:   Hematemesis Active Problems:   DM (diabetes mellitus)   HTN, goal below 130/80   Cirrhosis   HCC (hepatocellular carcinoma)   Hepatocellular carcinoma   Gastric varices    Hematemesis EGD did not reveal a culprit lesion but gastric varices were noted. BRTO (Balloon Occluded Retrograde Transvenous Obliteration) has been recommended. This will be done by interventional radiology at [facility]. Will stop protonix infusion. Continue octreotide infusion. Change protonix to every 12 hours.  Acute hypoxic respiratory failure/Community acquired pneumonia Versus Acute Diastolic CHF Patient was given 1 dose of Lasix. She was also started on antibiotics, which will be continued. Blood cultures have been obtained. Since she is symptomatically improved. We will hold off and further doses of Lasix. Recommend repeating a chest x-ray in 24 hours to see there is any change.  Normocytic anemia Likely due to chronic disease and acute blood loss. Hemoglobin has remained stable. No need for transfusion at this time.  [place] s/p embolization x 2 Transaminitis noted  post-procedure. Continue to monitor for now.  Hyperkalemia Potassium slightly low today. Repeat in the morning.  Essential hypertension Blood pressure stable. Holding HCTZ and ACEI for now.   DM type 2 HbA1c is 6.2. Continue sliding scale coverage. It appears that she is currently off of her diabetes medications.  Protein calorie malnutrition May be severe based on weight loss and cachexia. Nutrition consultation. Regular diet with supplements once diet advanced. 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 Recommend outpatient evaluation and management for same.  Leukocytosis Likely reactive from recent procedure or from the infectious process in the lung. Resolved.   DVT Prophylaxis: SCDs    Code Status: full code  Family Communication: discussed with the patient  Disposition Plan: transfer to [facility] today for interventional radiology procedure to be done on November 6    LOS: 2 days   [facility] Pager [phone] 11/27/2013, 2:11 PM  If 8PM-8AM, please contact night-coverage at [email or web address], password [facility]

## 2013-11-28 ENCOUNTER — Inpatient Hospital Stay (HOSPITAL_COMMUNITY): Payer: No Typology Code available for payment source

## 2013-11-28 DIAGNOSIS — I864 Gastric varices: Secondary | ICD-10-CM | POA: Insufficient documentation

## 2013-11-28 LAB — COMPREHENSIVE METABOLIC PANEL
ALBUMIN: 2.4 g/dL — AB (ref 3.5–5.2)
ALT: 87 U/L — ABNORMAL HIGH (ref 0–35)
AST: 155 U/L — AB (ref 0–37)
Alkaline Phosphatase: 152 U/L — ABNORMAL HIGH (ref 39–117)
Anion gap: 15 (ref 5–15)
BUN: 7 mg/dL (ref 6–23)
CO2: 22 mEq/L (ref 19–32)
CREATININE: 0.48 mg/dL — AB (ref 0.50–1.10)
Calcium: 7.7 mg/dL — ABNORMAL LOW (ref 8.4–10.5)
Chloride: 102 mEq/L (ref 96–112)
GFR calc Af Amer: >90 mL/min (ref >90)
GFR calc non Af Amer: >90 mL/min (ref >90)
Glucose, Bld: 86 mg/dL (ref 70–99)
POTASSIUM: 3.6 meq/L — AB (ref 3.7–5.3)
Sodium: 139 mEq/L (ref 137–147)
Total Bilirubin: 1.4 mg/dL — ABNORMAL HIGH (ref 0.3–1.2)
Total Protein: 6.2 g/dL (ref 6.0–8.3)

## 2013-11-28 LAB — GLUCOSE, CAPILLARY
GLUCOSE-CAPILLARY: 116 mg/dL — AB (ref 70–99)
Glucose-Capillary: 122 mg/dL — ABNORMAL HIGH (ref 70–99)
Glucose-Capillary: 140 mg/dL — ABNORMAL HIGH (ref 70–99)
Glucose-Capillary: 284 mg/dL — ABNORMAL HIGH (ref 70–99)

## 2013-11-28 LAB — CBC
HEMATOCRIT: 30.3 % — AB (ref 36.0–46.0)
HEMOGLOBIN: 10 g/dL — AB (ref 12.0–15.0)
MCH: 29.3 pg (ref 26.0–34.0)
MCHC: 33 g/dL (ref 30.0–36.0)
MCV: 88.9 fL (ref 78.0–100.0)
Platelets: 205 10*3/uL (ref 150–400)
RBC: 3.41 MIL/uL — ABNORMAL LOW (ref 3.87–5.11)
RDW: 16.5 % — AB (ref 11.5–15.5)
WBC: 7.3 10*3/uL (ref 4.0–10.5)

## 2013-11-28 LAB — PROCALCITONIN: Procalcitonin: 0.14 ng/mL

## 2013-11-28 MED ORDER — MIDAZOLAM HCL 2 MG/2ML IJ SOLN
INTRAMUSCULAR | Status: AC
  Filled 2013-11-28: qty 4

## 2013-11-28 MED ORDER — SODIUM CHLORIDE 0.9 % IV SOLN
INTRAVENOUS | Status: AC | PRN
  Administered 2013-11-28: 10 mL/h via INTRAVENOUS

## 2013-11-28 MED ORDER — LIDOCAINE HCL 1 % IJ SOLN
INTRAMUSCULAR | Status: AC
  Filled 2013-11-28: qty 20

## 2013-11-28 MED ORDER — BOOST PLUS PO LIQD
237.0000 mL | Freq: Three times a day (TID) | ORAL | Status: DC
  Administered 2013-11-29 – 2013-11-30 (×3): 237 mL via ORAL
  Filled 2013-11-28 (×16): qty 237

## 2013-11-28 MED ORDER — HYDROMORPHONE HCL 1 MG/ML IJ SOLN
INTRAMUSCULAR | Status: AC
  Filled 2013-11-28: qty 2

## 2013-11-28 MED ORDER — IOHEXOL 300 MG/ML  SOLN
150.0000 mL | Freq: Once | INTRAMUSCULAR | Status: AC | PRN
  Administered 2013-11-28: 1 mL via INTRAVENOUS

## 2013-11-28 MED ORDER — FENTANYL CITRATE 0.05 MG/ML IJ SOLN
INTRAMUSCULAR | Status: AC | PRN
  Administered 2013-11-28 (×3): 25 ug via INTRAVENOUS
  Administered 2013-11-28: 50 ug via INTRAVENOUS
  Administered 2013-11-28: 25 ug via INTRAVENOUS
  Administered 2013-11-28: 50 ug via INTRAVENOUS

## 2013-11-28 MED ORDER — CEFAZOLIN SODIUM-DEXTROSE 2-3 GM-% IV SOLR
INTRAVENOUS | Status: AC
  Administered 2013-11-28: 2 g via INTRAVENOUS
  Filled 2013-11-28: qty 50

## 2013-11-28 MED ORDER — FENTANYL CITRATE 0.05 MG/ML IJ SOLN
INTRAMUSCULAR | Status: AC
  Filled 2013-11-28: qty 4

## 2013-11-28 MED ORDER — POTASSIUM CHLORIDE CRYS ER 20 MEQ PO TBCR
40.0000 meq | EXTENDED_RELEASE_TABLET | Freq: Once | ORAL | Status: AC
  Administered 2013-11-28: 40 meq via ORAL
  Filled 2013-11-28: qty 2

## 2013-11-28 MED ORDER — ACETAMINOPHEN 325 MG PO TABS
650.0000 mg | ORAL_TABLET | Freq: Four times a day (QID) | ORAL | Status: DC | PRN
  Administered 2013-11-28 (×2): 650 mg via ORAL
  Filled 2013-11-28 (×2): qty 2

## 2013-11-28 MED ORDER — MIDAZOLAM HCL 2 MG/2ML IJ SOLN
INTRAMUSCULAR | Status: AC | PRN
  Administered 2013-11-28: 1 mg via INTRAVENOUS
  Administered 2013-11-28 (×4): 0.5 mg via INTRAVENOUS
  Administered 2013-11-28: 1 mg via INTRAVENOUS

## 2013-11-28 NOTE — Sedation Documentation (Signed)
Patient denies pain and is resting comfortably.  

## 2013-11-28 NOTE — Sedation Documentation (Signed)
Vital signs stable. 

## 2013-11-28 NOTE — Sedation Documentation (Signed)
MD scrubbed out for 20 min. Pt continued to be monitored. VSS at this time.

## 2013-11-28 NOTE — Progress Notes (Signed)
[facility] PROGRESS NOTE  Whitney Patel TSV:[id number] DOB: 1955-07-10 DOA: 11/25/2013  PCP: Elberta Leatherwood, MD  Brief HPI: The patient is a 58 y.o. year-old female with history of hepatitis C with cirrhosis and stage IIIA HCC, DM, HTN.She completed combined bland embolization and DEB/TACE on 09/16/2013 and had since recovered.Imaging demonstrated less enhancement of the multifocal tumor burden in the right hepatic lobe with areas of central necrosis. She presented on 11/3 for repeat hepatic DEB/TACE/bland embolization. She is followed by Dr.'s Alen Blew and [facility]. She tolerated her procedure well, however, postprocedure, she developed hematemesis and was vomiting approximately 50-75 mL's of bright red blood mixed with water.She underwent EGD on 11/4.  Past medical history:  Past Medical History  Diagnosis Date  . Hypertension   . Hepatocellular carcinoma   . Diabetes mellitus   . Hepatitis C     Consultants: IR, Dr. Collene Mares  Procedures:  DEB/TACE/bland embolization 11/3  EGD 11/4 IMPRESSION:  1) Widely patent esophagus with no varices or source of bleeding identified. 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 3) Portal hypertensive gastropathy was noted throughout the stomach. 4) The duodenal mucosa showed no abnormalities.  2D ECHO 11/4 Study Conclusions - Left ventricle: The cavity size was normal. Systolic function was normal. The estimated ejection fraction was in the range of 60% to 65%. Wall motion was normal; there were no regional wallmotion abnormalities. Doppler parameters are consistent withabnormal left ventricular relaxation (grade 1 diastolicdysfunction). - Mitral valve: There was mild regurgitation. - Left atrium: The atrium was mildly dilated. - Right ventricle: The cavity size was mildly dilated. Wall thickness was normal. - Right atrium: The atrium was moderately dilated. - Tricuspid valve: There was moderate regurgitation. - Pulmonary  arteries: Systolic pressure was moderately to severelyincreased. PA peak pressure: 69 mm Hg (S).  Antibiotics: Ceftriaxone and Azithromycin 11/4-->  Subjective: Patient reports SOB when walking (having to stop 5 x to walk down the block).  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  Asks if she may be eligible for disability.  Objective: Vital Signs  Filed Vitals:   11/28/13 1030 11/28/13 1035 11/28/13 1040 11/28/13 1112  BP: 132/82 148/83 131/74 122/80  Pulse: 76 75 74 68  Temp:      TempSrc:      Resp: 20 19 18    Height:      Weight:      SpO2: 100% 100% 100%     Intake/Output Summary (Last 24 hours) at 11/28/13 1335 Last data filed at 11/28/13 0853  Gross per 24 hour  Intake    240 ml  Output      0 ml  Net    240 ml   Filed Weights   11/25/13 1230 11/27/13 1841  Weight: 58.968 kg (130 lb) 58.968 kg (130 lb)    General appearance: thin, AA female, alert, cooperative and no distress, sitting up in bed. Resp: clear to auscultation bilaterally, decreased lung sounds on the right. Cardio: regular rate and rhythm, S1, S2 normal, no murmur, click, rub or gallop GI: soft, non-tender; bowel sounds normal; no masses, abdomen mildly distended, ++ hepatomegaly. Neurologic: Alert and oriented X 3, normal strength and tone. Normal symmetric reflexes. Normal coordination  Lab Results:  Basic Metabolic Panel:  Recent Labs Lab 11/25/13 0800 11/26/13 0850 11/27/13 0430 11/28/13 0426  NA 139 135* 137 139  K 4.3 5.7* 3.5* 3.6*  CL 101 99 101 102  CO2 24 21 24 22   GLUCOSE 142* 108* 76 86  BUN 11 12 11 7   CREATININE 0.72 0.53 0.59 0.48*  CALCIUM 9.0 8.9 8.2* 7.7*   Liver Function Tests:  Recent Labs Lab 11/25/13 0800 11/26/13 0850 11/27/13 0430 11/28/13 0426  AST 108* 228* 221* 155*  ALT 63* 95* 112* 87*  ALKPHOS 192* 174* 160* 152*  BILITOT 1.2 1.3* 1.4* 1.4*  PROT 7.7 8.0 6.8 6.2  ALBUMIN 3.2* 3.0* 2.6* 2.4*    CBC:  Recent Labs Lab 11/25/13 0800 11/26/13 0850 11/26/13 1230 11/27/13 0430 11/28/13 0426  WBC 8.1 13.7*  --  9.4 7.3  NEUTROABS 5.4  --   --   --   --   HGB 10.4* 11.1* 11.0* 10.3* 10.0*  HCT 31.9* 33.6* 33.3* 31.4* 30.3*  MCV 90.1 90.1  --  89.7 88.9  PLT 242 248  --  249 205   CBG:  Recent Labs Lab 11/26/13 1837 11/27/13 0035 11/28/13 0021 11/28/13 0624 11/28/13 1251  GLUCAP 148* 138* 140* 116* 122*    Recent Results (from the past 240 hour(s))  Culture, blood (routine x 2)     Status: None (Preliminary result)   Collection Time: 11/26/13  6:46 PM  Result Value Ref Range Status   Specimen Description BLOOD RIGHT ARM  Final   Special Requests BOTTLES DRAWN AEROBIC AND ANAEROBIC 10CC  Final   Culture  Setup Time   Final    11/26/2013 22:24 Performed at [facility]    Culture   Final           BLOOD CULTURE RECEIVED NO GROWTH TO DATE CULTURE WILL BE HELD FOR 5 DAYS BEFORE ISSUING A FINAL NEGATIVE REPORT Performed at [facility]    Report Status PENDING  Incomplete  Culture, blood (routine x 2)     Status: None (Preliminary result)   Collection Time: 11/26/13  6:53 PM  Result Value Ref Range Status   Specimen Description BLOOD RIGHT HAND  Final   Special Requests BOTTLES DRAWN AEROBIC AND ANAEROBIC 10CC  Final   Culture  Setup Time   Final    11/26/2013 22:24 Performed at [facility]    Culture   Final           BLOOD CULTURE RECEIVED NO GROWTH TO DATE CULTURE WILL BE HELD FOR 5 DAYS BEFORE ISSUING A FINAL NEGATIVE REPORT Performed at [facility]    Report Status PENDING  Incomplete      Studies/Results:    11/28/2013   : IR ULTRASOUND GUIDANCE VASC ACCESS RIGHT; ARTERIOGRAPHY; IR EMBO VENOUS NOT HEMORR HEMANG INC GUIDE ROADMAPPING; LEFT RENAL VENOGRAPHY; ADDITIONAL ARTERIOGRAPHY   IMPRESSION: Technically successful coil accelerated balloon occluded retrograde transvenous obliteration (CA-BRTO) of gastric varices.   PLAN: 1. Bed rest today. 2. Begin with clear liquid diet and advance diet as tolerated throughout the day. 3. CT scan in the morning to confirm successful occlusion of all gastric varices (scan is ordered). 4. If the patient is doing clinically well tomorrow and CT scan demonstrates successful occlusion of the gastric varices common discharge would be possible at the discretion of the admitting service. 5. Followup with Dr. Laurence Ferrari in [place] clinic in 1 month. Signed,  Criselda Peaches, MD  [facility]  [facility]   Electronically Signed   By: Jacqulynn Cadet M.D.   On: 11/28/2013 11:42   11/28/2013   CLINICAL DATA:  58 year old female with HCV cirrhosis  complicated by a hepatocellular carcinoma and bleeding gastric varices. The bleeding gastric varices were confirmed endoscopically and there was evidence of recent bleeding. She presents today for balloon occluded retrograde transvenous obliteration of her gastric varices.  EXAM: IR ULTRASOUND GUIDANCE VASC ACCESS RIGHT; ARTERIOGRAPHY; IR EMBO VENOUS NOT HEMORR HEMANG INC GUIDE ROADMAPPING; LEFT RENAL VENOGRAPHY; ADDITIONAL ARTERIOGRAPHY   IMPRESSION: Technically successful coil accelerated balloon occluded retrograde transvenous obliteration (CA-BRTO) of gastric varices.  PLAN: 1. Bed rest today. 2. Begin with clear liquid diet and advance diet as tolerated throughout the day. 3. CT scan in the morning to confirm successful occlusion of all gastric varices (scan is ordered). 4. If the patient is doing clinically well tomorrow and CT scan demonstrates successful occlusion of the gastric varices common discharge would be possible at the discretion of the admitting service. 5. Followup with Dr. Laurence Ferrari in [place] clinic in 1 month. Signed,  Criselda Peaches, MD  [facility]  [facility]   Electronically Signed   By: Jacqulynn Cadet M.D.   On: 11/28/2013 11:42   Dg  Chest Port 1 View  11/26/2013   CLINICAL DATA:  Initial encounter for hypoxia after hepatic artery embolization yesterday.  EXAM: PORTABLE CHEST - 1 VIEW  COMPARISON:  01/16/2013.  FINDINGS: Seventeen. Lung volumes are low. There is vascular congestion with patchy bilateral interstitial and airspace opacity, left greater than right. Possible tiny left pleural effusion. Cardiopericardial silhouette is enlarged. Imaged bony structures of the thorax are intact.  IMPRESSION: Vascular congestion with patchy airspace disease, left greater than right. Asymmetric edema or diffuse infection would be a consideration.  Enlargement of the cardiopericardial silhouette.   Electronically Signed   By: Misty Stanley M.D.   On: 11/26/2013 17:25      Medications:  Scheduled: . amLODipine  10 mg Oral q morning - 10a  . azithromycin  500 mg Intravenous Q24H  . carvedilol  6.25 mg Oral BID WC  . cefTRIAXone (ROCEPHIN)  IV  1 g Intravenous Q24H  . docusate sodium  100 mg Oral BID  . fentaNYL      . lactose free nutrition  237 mL Oral TID WC  . lidocaine      . midazolam      . [START ON 11/29/2013] pantoprazole (PROTONIX) IV  40 mg Intravenous Q12H  . potassium chloride  40 mEq Oral Once  . sodium chloride  3 mL Intravenous Q12H   Continuous: . sodium chloride 500 mL (11/26/13 1543)  . sodium chloride    . sodium chloride     VQM:GQQPYP chloride, HYDROcodone-acetaminophen, ondansetron (ZOFRAN) IV, promethazine **OR** promethazine, sodium chloride  Assessment/Plan:  Principal Problem:   Hematemesis Active Problems:   DM (diabetes mellitus)   HTN, goal below 130/80   Cirrhosis   HCC (hepatocellular carcinoma)   Hepatocellular carcinoma   Gastric varices    Hematemesis Last Hematemesis was on 11/3 per patient.  Base line hgb 12.0.  Hgb now stable at approx 10.0.  EGD did not reveal a culprit lesion but gastric varices were noted. BRTO (Balloon Occluded Retrograde Transvenous Obliteration) was  completed 11/6 by IR. Octreotide infusion discontinued. Protonix to every 12 hours.  Advancing diet as tolerated.  Acute hypoxic respiratory failure/Community acquired pneumonia Versus Acute Diastolic CHF vs PHTN. Patient complains of significantly decreased exercise tolerance.  This is likely due to Pulm HTN and an enlarged liver.  Patient was given 1 dose of Lasix. She was also started on antibiotics (azith /  rocephin) which will be continued. Blood cultures show NGTD.  Repeating CXR 11/6 to attempt to determine the etiology.  Normocytic anemia Likely due to chronic disease and acute blood loss. Hemoglobin has remained stable. No need for transfusion at this time.  [place] s/p embolization x 2 Transaminitis noted post-procedure. Trending down well.  Hyperkalemia Potassium slightly low today. Repeat in the morning.  Essential hypertension Blood pressure stable. Holding HCTZ and ACEI for now.   DM type 2 HbA1c is 6.2. Diet controlled at home.  Requiring minimal insulin (1 unit).  Will discontinue cbgs and SSI.  Protein calorie malnutrition May be severe based on weight loss and cachexia. Nutrition consultation completed.. Regular diet with supplements once diet advanced. On megace.  Will try Boost.  Patient did not tolerate Ensure.  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 Recommend outpatient evaluation and management for same.  Leukocytosis Likely reactive from recent procedure or from the infectious process in the lung. Resolved.  Social:  Patient concerned that she will be unable to return to work (due to SOB).  Requests guidance about whether or not she would be eligible for disability.  DVT Prophylaxis: SCDs    Code Status: full code  Family Communication: discussed with the patient    Disposition Plan: to home when able.    LOS: 3 days   Karen Kitchens [facility] Pager [phone] 11/28/2013, 1:35 PM  If 8PM-8AM, please contact night-coverage at [email or web address], password [facility]

## 2013-11-28 NOTE — Procedures (Signed)
Interventional Radiology Procedure Note  Procedure: Coil accelerated balloon-occluded retrograde transvenous obliteration (CA-BRTO) of gastric varices.  Access: Right common femoral vein, 66K Complications: none Recommendations: - Bedrest today - Clears to start, may ADAT throughout the day - Labs in am (CBC, CMP, INR) - BRTO CT scan tomorrow (ordered) - If doing well tomorrow and CT shows successful obliteration of varices, pt may be DC'd tomorrow afternoon at the discretion of the admitting service   Signed,  Criselda Peaches, MD

## 2013-11-28 NOTE — Sedation Documentation (Addendum)
Pt placed in non-rebreather, SpO2 100%.  MD made aware.

## 2013-11-28 NOTE — Plan of Care (Signed)
Problem: Phase II Progression Outcomes Goal: Vital signs remain stable Outcome: Completed/Met Date Met:  11/28/13  Problem: Phase III Progression Outcomes Goal: Activity at appropriate level-compared to baseline (UP IN CHAIR FOR HEMODIALYSIS)  Outcome: Completed/Met Date Met:  11/28/13 Goal: Voiding independently Outcome: Completed/Met Date Met:  11/28/13 Goal: Foley discontinued Outcome: Not Applicable Date Met:  70/78/67

## 2013-11-28 NOTE — Progress Notes (Signed)
Interventional Radiology Post Op Note  HPI:  58 yo female with HCV cirrhosis, HCC, portal HTN and bleeding gastric varices.  Now POD #0 s/p CA-BRTO  Subjective: Feeling well.  Denies pain, discomfort, nausea or vomiting.  Breathing well, no SOB or CP.  She is hungry and requests [employer].   Objective:  Filed Vitals:   11/28/13 1654  BP: 116/62  Pulse: 80  Temp:   Resp:    Right groin: Soft, NT.  No hematoma Abd: Soft, NT/ND  Plan: - advance diet - case manager consult, pt would like to discuss disability - labs in am to evaluate liver function - CT in am to confirm obliteration of GVs  - If CT shows no residual at risk varices and pt continues to do well, there would be no barriers to DC from IR perspective.     Signed,  Criselda Peaches, MD

## 2013-11-28 NOTE — Sedation Documentation (Addendum)
Procedure resumed.  MD at bedside. VSS at this time.

## 2013-11-28 NOTE — Progress Notes (Signed)
OT Cancellation Note  Patient Details Name: Whitney Patel MRN: [id number] DOB: 1955-06-24   Cancelled Treatment:    Reason Eval/Treat Not Completed: Patient not medically ready (on bedrest following procedure)  RN also reports low O2 sats.  Will attempt again as time allows.  [place], [place] 11/28/2013, 3:31 PM

## 2013-11-29 ENCOUNTER — Inpatient Hospital Stay (HOSPITAL_COMMUNITY): Payer: No Typology Code available for payment source

## 2013-11-29 LAB — COMPREHENSIVE METABOLIC PANEL
ALK PHOS: 146 U/L — AB (ref 39–117)
ALT: 61 U/L — ABNORMAL HIGH (ref 0–35)
AST: 112 U/L — AB (ref 0–37)
Albumin: 2.1 g/dL — ABNORMAL LOW (ref 3.5–5.2)
Anion gap: 13 (ref 5–15)
BUN: 14 mg/dL (ref 6–23)
CALCIUM: 7.7 mg/dL — AB (ref 8.4–10.5)
CHLORIDE: 100 meq/L (ref 96–112)
CO2: 22 meq/L (ref 19–32)
Creatinine, Ser: 0.58 mg/dL (ref 0.50–1.10)
GFR calc Af Amer: >90 mL/min (ref >90)
Glucose, Bld: 106 mg/dL — ABNORMAL HIGH (ref 70–99)
POTASSIUM: 4.1 meq/L (ref 3.7–5.3)
SODIUM: 135 meq/L — AB (ref 137–147)
Total Bilirubin: 1.3 mg/dL — ABNORMAL HIGH (ref 0.3–1.2)
Total Protein: 6 g/dL (ref 6.0–8.3)

## 2013-11-29 LAB — GLUCOSE, CAPILLARY
GLUCOSE-CAPILLARY: 122 mg/dL — AB (ref 70–99)
GLUCOSE-CAPILLARY: 95 mg/dL (ref 70–99)
Glucose-Capillary: 137 mg/dL — ABNORMAL HIGH (ref 70–99)
Glucose-Capillary: 150 mg/dL — ABNORMAL HIGH (ref 70–99)
Glucose-Capillary: 159 mg/dL — ABNORMAL HIGH (ref 70–99)

## 2013-11-29 LAB — PROTIME-INR
INR: 1.28 (ref 0.00–1.49)
Prothrombin Time: 16.1 seconds — ABNORMAL HIGH (ref 11.6–15.2)

## 2013-11-29 LAB — CBC
HCT: 29.3 % — ABNORMAL LOW (ref 36.0–46.0)
Hemoglobin: 9.6 g/dL — ABNORMAL LOW (ref 12.0–15.0)
MCH: 29.4 pg (ref 26.0–34.0)
MCHC: 32.8 g/dL (ref 30.0–36.0)
MCV: 89.6 fL (ref 78.0–100.0)
Platelets: 179 10*3/uL (ref 150–400)
RBC: 3.27 MIL/uL — AB (ref 3.87–5.11)
RDW: 16.5 % — ABNORMAL HIGH (ref 11.5–15.5)
WBC: 7.6 10*3/uL (ref 4.0–10.5)

## 2013-11-29 MED ORDER — LEVOFLOXACIN IN D5W 500 MG/100ML IV SOLN
500.0000 mg | INTRAVENOUS | Status: DC
  Administered 2013-11-29: 500 mg via INTRAVENOUS
  Filled 2013-11-29 (×3): qty 100

## 2013-11-29 MED ORDER — ALBUTEROL SULFATE (2.5 MG/3ML) 0.083% IN NEBU: 2.5000 mg | INHALATION_SOLUTION | Freq: Four times a day (QID) | RESPIRATORY_TRACT | Status: DC | PRN

## 2013-11-29 MED ORDER — GUAIFENESIN 100 MG/5ML PO SOLN
5.0000 mL | ORAL | Status: DC | PRN
  Administered 2013-11-29: 100 mg via ORAL
  Filled 2013-11-29 (×2): qty 5

## 2013-11-29 MED ORDER — IOHEXOL 300 MG/ML  SOLN
100.0000 mL | Freq: Once | INTRAMUSCULAR | Status: AC | PRN
  Administered 2013-11-29: 100 mL via INTRAVENOUS

## 2013-11-29 MED ORDER — IPRATROPIUM-ALBUTEROL 0.5-2.5 (3) MG/3ML IN SOLN
3.0000 mL | Freq: Three times a day (TID) | RESPIRATORY_TRACT | Status: DC
  Administered 2013-11-30 – 2013-12-01 (×4): 3 mL via RESPIRATORY_TRACT
  Filled 2013-11-29 (×8): qty 3

## 2013-11-29 MED ORDER — IPRATROPIUM-ALBUTEROL 0.5-2.5 (3) MG/3ML IN SOLN
3.0000 mL | Freq: Four times a day (QID) | RESPIRATORY_TRACT | Status: DC
  Administered 2013-11-29 (×2): 3 mL via RESPIRATORY_TRACT
  Filled 2013-11-29 (×2): qty 3

## 2013-11-29 NOTE — Progress Notes (Signed)
Patient ID: Whitney Patel, female   DOB: 04/20/1955, 58 y.o.   MRN: [id number]    Dr Laurence Ferrari and Dr Pascal Lux have seen CT of this am Feel all looks great on CT after BRTO procedure 11/6  Will need to see Dr Laurence Ferrari in 1 mo at clinic for follow up She will hear from scheduler for time and date Order already in computer for same  May DC pt from our standpoint

## 2013-11-29 NOTE — Progress Notes (Signed)
PROGRESS NOTE  Whitney Patel SVX:[id number] DOB: 1955-08-25 DOA: 11/25/2013 PCP: Elberta Leatherwood, MD  HPI: The patient is a 58 y.o. year-old female with history of hepatitis C with cirrhosis and stage IIIA HCC, DM, HTN.She completed combined bland embolization and DEB/TACE on 09/16/2013 and had since recovered.Imaging demonstrated less enhancement of the multifocal tumor burden in the right hepatic lobe with areas of central necrosis. She presented on 11/3 for repeat hepatic DEB/TACE/bland embolization. She is followed by Dr.'s Alen Blew and [facility]. She tolerated her procedure well, however, postprocedure, she developed hematemesis and was vomiting approximately 50-75 mL's of bright red blood mixed with water.She underwent EGD on 11/4.  Subjective/ 24 H Interval events She is doing well this morning, seen after coming back up from the CT, does not complain of any shortness of breath but endorses pleuritic type chest pain with deep breaths. She is still on oxygen.  Assessment/Plan: Principal Problem:   Hematemesis Active Problems:   DM (diabetes mellitus)   HTN, goal below 130/80   Cirrhosis   HCC (hepatocellular carcinoma)   Hepatocellular carcinoma   Gastric varices   Bleeding gastric varices   Hematemesis Last Hematemesis was on 11/3 per patient. Base line hgb 12.0. Hgb now stable.. EGD did not reveal a culprit lesion but gastric varices were noted. BRTO (Balloon Occluded Retrograde Transvenous Obliteration) was completed 11/6 by IR. Octreotide infusion discontinued. Protonix to every 12 hours. Advancing diet as tolerated.  Acute hypoxic respiratory failure/Community acquired pneumonia Versus Acute Diastolic CHF vs PHTN. - this is likely multifactorial with a combination of deconditioning and she endorses significantly decreased exercise tolerance, pulmonary hypertension, presence of community-acquired pneumonia, acute on chronic diastolic heart failure and also the possibility of  mild aspiration with her hematemesis. She still in oxygen this morning. She has a history of tobacco abuse, started smoking at the age of 89 and quit about couple of months ago, and is possible that she has underlying lung disease. - Change her antibiotics to levofloxacin today - add duonebs every 6 hours scheduled - ambulate  Normocytic anemia Likely due to chronic disease and acute blood loss. Hemoglobin has remained stable. No need for transfusion at this time.  [place] s/p embolization x 2 Transaminitis noted post-procedure. Trending down well.  Hyperkalemia Potassium slightly low, improved after repletion  Essential hypertension Blood pressure stable. Holding HCTZ and ACEI for now.   DM type 2 HbA1c is 6.2. Diet controlled at home. Requiring minimal insulin (1 unit). Will discontinue cbgs and SSI.  Protein calorie malnutrition May be severe based on weight loss and cachexia. Nutrition consultation completed.. Regular diet with supplements once diet advanced. On megace. Will try Boost. Patient did not tolerate Ensure.  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 Recommend outpatient evaluation and management for same.  Leukocytosis Likely reactive from recent procedure or from the infectious process in the lung. Resolved.  Social: Patient concerned that she will be unable to return to work (due to SOB). Requests guidance about whether or not she would be eligible for disability.   Diet: regular Fluids: none DVT Prophylaxis: SCDs  Code Status: full code Family Communication: discussed with the patient  Disposition Plan: home when ready  Consultants:   GI  Procedures:  Interventional radiology   Antibiotics Ceftriaxone 11/4 >> 11/7 Azithromycin 11/4 >> 11/7 Levofloxacin  11/7 >>   Studies  Dg Chest 2 View 11/28/2013 Improved lung aeration when compared to the prior study. There is still some residual lung base opacity which may reflect residual asymmetric edema, infectious infiltrates or atelectasis or a combination. No new abnormalities.   Ir Embo Venous Not [place] 11/28/2013 Technically successful coil accelerated balloon occluded retrograde transvenous obliteration (CA-BRTO) of gastric varices.  PLAN: 1. Bed rest today. 2. Begin with clear liquid diet and advance diet as tolerated throughout the day. 3. CT scan in the morning to confirm successful occlusion of all gastric varices (scan is ordered). 4. If the patient is doing clinically well tomorrow and CT scan demonstrates successful occlusion of the gastric varices common discharge would be possible at the discretion of the admitting service. 5. Followup with Dr. Laurence Ferrari in [place] [facility] in 1 month.   CT Abd Wo & W Cm:11/7 1. Successful obliteration of the gastric varices and coil embolization of gastro renal shunt. 2. Mild -moderate spill over of sclerosant into the portal splenic confluence, main portal vein and small peripheral branches of the right portal vein throughout the periphery of the right liver. No evidence of thrombus formation or flow limitation in the main portal vein. The splenic vein remains patent. This degree of spillover is highly likely clinically insignificant and will be expected to resolve over the coming weeks. Re-assessment will be made in 1 month with repeat CT evaluation. 3. Similar appearance of the liver with advanced multicentric hepatocellular carcinoma throughout the right hepatic lobe. There appears to be some interval decrease in the amount of tumor enhancement compared to 10/28/2013. 4. Punctate nonobstructing right nephrolithiasis. 5. Gallbladder distention with mild gallbladder wall thickening. In the appropriate clinical setting, cholecystitis could  have a similar appearance. 6. Additional ancillary findings as above.   Objective  Filed Vitals:   11/28/13 1654 11/28/13 2142 11/28/13 2236 11/29/13 0620  BP: 116/62 102/62  107/67  Pulse: 80 74  72  Temp:  100.5 F (38.1 C) 99.5 F (37.5 C) 99.1 F (37.3 C)  TempSrc:  Oral Oral Oral  Resp:  20  20  Height:      Weight:      SpO2:  99%  72%   No intake or output data in the 24 hours ending 11/29/13 1032 Filed Weights   11/25/13 1230 11/27/13 1841  Weight: 58.968 kg (130 lb) 58.968 kg (130 lb)    Exam:  General:  No acute distress  Cardiovascular: regular rate and rhythm  Respiratory: decreased breath sounds throughout, no wheezing  Abdomen: soft, nontender to palpation  MSK: no peripheral edema  Neuro: grossly nonfocal  Data Reviewed: Basic Metabolic Panel:  Recent Labs Lab 11/25/13 0800 11/26/13 0850 11/27/13 0430 11/28/13 0426 11/29/13 0334  NA 139 135* 137 139 135*  K 4.3 5.7* 3.5* 3.6* 4.1  CL 101 99 101 102 100  CO2 24 21 24 22 22   GLUCOSE 142* 108* 76 86 106*  BUN 11 12 11 7 14   CREATININE 0.72 0.53 0.59 0.48* 0.58  CALCIUM 9.0 8.9 8.2* 7.7* 7.7*   Liver Function Tests:  Recent Labs Lab 11/25/13 0800 11/26/13 0850 11/27/13 0430 11/28/13 0426 11/29/13 0334  AST 108* 228* 221* 155* 112*  ALT 63* 95* 112* 87* 61*  ALKPHOS 192* 174* 160* 152* 146*  BILITOT 1.2 1.3* 1.4* 1.4* 1.3*  PROT 7.7 8.0 6.8 6.2 6.0  ALBUMIN 3.2* 3.0* 2.6* 2.4* 2.1*   No results for input(s):  LIPASE, AMYLASE in the last 168 hours. No results for input(s): AMMONIA in the last 168 hours. CBC:  Recent Labs Lab 11/25/13 0800 11/26/13 0850 11/26/13 1230 11/27/13 0430 11/28/13 0426 11/29/13 0334  WBC 8.1 13.7*  --  9.4 7.3 7.6  NEUTROABS 5.4  --   --   --   --   --   HGB 10.4* 11.1* 11.0* 10.3* 10.0* 9.6*  HCT 31.9* 33.6* 33.3* 31.4* 30.3* 29.3*  MCV 90.1 90.1  --  89.7 88.9 89.6  PLT 242 248  --  249 205 179   Cardiac Enzymes: No results for input(s):  CKTOTAL, CKMB, CKMBINDEX, TROPONINI in the last 168 hours. BNP (last 3 results) No results for input(s): PROBNP in the last 8760 hours. CBG:  Recent Labs Lab 11/28/13 0624 11/28/13 1251 11/28/13 1745 11/29/13 0037 11/29/13 0616  GLUCAP 116* 122* 284* 122* 95    Recent Results (from the past 240 hour(s))  Culture, blood (routine x 2)     Status: None (Preliminary result)   Collection Time: 11/26/13  6:46 PM  Result Value Ref Range Status   Specimen Description BLOOD RIGHT ARM  Final   Special Requests BOTTLES DRAWN AEROBIC AND ANAEROBIC 10CC  Final   Culture  Setup Time   Final    11/26/2013 22:24 Performed at [facility]    Culture   Final           BLOOD CULTURE RECEIVED NO GROWTH TO DATE CULTURE WILL BE HELD FOR 5 DAYS BEFORE ISSUING A FINAL NEGATIVE REPORT Performed at [facility]    Report Status PENDING  Incomplete  Culture, blood (routine x 2)     Status: None (Preliminary result)   Collection Time: 11/26/13  6:53 PM  Result Value Ref Range Status   Specimen Description BLOOD RIGHT HAND  Final   Special Requests BOTTLES DRAWN AEROBIC AND ANAEROBIC 10CC  Final   Culture  Setup Time   Final    11/26/2013 22:24 Performed at [facility]    Culture   Final           BLOOD CULTURE RECEIVED NO GROWTH TO DATE CULTURE WILL BE HELD FOR 5 DAYS BEFORE ISSUING A FINAL NEGATIVE REPORT Performed at [facility]    Report Status PENDING  Incomplete     Scheduled Meds: . amLODipine  10 mg Oral q morning - 10a  . carvedilol  6.25 mg Oral BID WC  . docusate sodium  100 mg Oral BID  . ipratropium-albuterol  3 mL Nebulization Q6H  . lactose free nutrition  237 mL Oral TID WC  . levofloxacin (LEVAQUIN) IV  500 mg Intravenous Q24H  . pantoprazole (PROTONIX) IV  40 mg Intravenous Q12H  . sodium chloride  3 mL Intravenous Q12H   Continuous Infusions: . sodium chloride 500 mL (11/26/13 1543)  . sodium chloride    . sodium chloride      Time  spent: 25 minutes  Marzetta Board, MD [facility] Pager [phone]. If 7 PM - 7 AM, please contact night-coverage at [email or web address], password [facility] 11/29/2013, 10:32 AM  LOS: 4 days

## 2013-11-29 NOTE — Progress Notes (Signed)
Referring Physician(s): Shick,Michael  Subjective:  Balloon occluded retrograde transvenous obliteration performed in IR 11/6 Pt rested well Eating well No N/V Does complain of some mid chest pain when breathes deeply Has been ambulating in room For CT chest this am  Allergies: Review of patient's allergies indicates no known allergies.  Medications: Prior to Admission medications   Medication Sig Start Date End Date Taking? Authorizing Provider  amLODipine (NORVASC) 10 MG tablet Take 10 mg by mouth every morning.   Yes Historical Provider, MD  carvedilol (COREG) 12.5 MG tablet Take 6.25 mg by mouth 2 (two) times daily with a meal.   Yes Historical Provider, MD  hydrochlorothiazide (MICROZIDE) 12.5 MG capsule Take 1 capsule (12.5 mg total) by mouth 2 (two) times daily. 11/18/13  Yes Elberta Leatherwood, MD  lisinopril (PRINIVIL,ZESTRIL) 20 MG tablet Take 20 mg by mouth 2 (two) times daily.   Yes Historical Provider, MD  megestrol (MEGACE ES) 625 MG/5ML suspension Take 5 mLs (625 mg total) by mouth daily. 10/03/13   Wyatt Portela, MD  promethazine (PHENERGAN) 25 MG tablet Take 25 mg by mouth every 6 (six) hours as needed for nausea.  09/17/13   Historical Provider, MD    Review of Systems  Vital Signs: BP 107/67 mmHg  Pulse 72  Temp(Src) 99.1 F (37.3 C) (Oral)  Resp 20  Ht 5\' 5"  (1.651 m)  Wt 58.968 kg (130 lb)  BMI 21.63 kg/m2  SpO2 72%  Physical Exam  Pulmonary/Chest: Effort normal. She has no wheezes.  T max 101.7 last pm Now 99.1  Lungs sound clear Pt does cough if breathes deeply    Imaging: Dg Chest 2 View  11/28/2013   CLINICAL DATA:  Patient complaining of on an all shortness of breath for 2 months. Patient has a history of hepatic cellular carcinoma, hepatitis-C and hypertension. Patient is a smoker.  EXAM: CHEST  2 VIEW  COMPARISON:  11/26/2013.  FINDINGS: Lung volumes are relatively low. There is central vascular congestion similar to the prior study. Patchy  lower lung airspace opacity seen previously, has improved, with residual opacity greater on the left. No convincing pleural effusion. No pneumothorax.  Cardiac silhouette is mildly enlarged. No mediastinal or hilar masses.  Bony thorax is grossly intact.  IMPRESSION: 1. Improved lung aeration when compared to the prior study. There is still some residual lung base opacity which may reflect residual asymmetric edema, infectious infiltrates or atelectasis or a combination. No new abnormalities.   Electronically Signed   By: Lajean Manes M.D.   On: 11/28/2013 14:24   Ir Angiogram Visceral Selective  11/25/2013   CLINICAL DATA:  Multifocal diffuse hepatocellular carcinoma  EXAM: ULTRASOUND GUIDANCE FOR VASCULAR ACCESS  CELIAC ANGIOGRAM  COMMON HEPATIC, PROPER HEPATIC, RIGHT AND LEFT HEPATIC ANGIOGRAMS  RIGHT AND LEFT HEPATIC ARTERY BLAND EMBOLIZATIONS  RIGHT HEPATIC ARTERY DRUG-ELUTING BEADS TRANS ARTERIAL CHEMO EMBOLIZATION  Date:  11/3/201511/03/2013 11:52 am  Radiologist:  M. Daryll Brod, MD  Guidance:  Ultrasound and fluoroscopic  FLUOROSCOPY TIME:  12 min 36 seconds, 2,853 mGy  MEDICATIONS AND MEDICAL HISTORY: 4 mg versed, 200 mcg fentanyl, 20 mg Decadron, 4 mg Zofran, 3.375 g Zosyn administered within 1 hr of the procedure  ANESTHESIA/SEDATION: 1 hr 12 min  CONTRAST:  140 cc Omnipaque [id number]  COMPLICATIONS: None immediate  PROCEDURE: Informed consent was obtained from the patient following explanation of the procedure, risks, benefits and alternatives. The patient understands, agrees and consents for the procedure. All questions were  addressed. A time out was performed.  Maximal barrier sterile technique utilized including caps, mask, sterile gowns, sterile gloves, large sterile drape, hand hygiene, and Betadine.  Under sterile conditions and local anesthesia, ultrasound micropuncture access was performed of the right common femoral artery. Five French sheath inserted over a Bentson guidewire. C2 catheter  utilized to select the celiac origin. Selective celiac angiogram performed.  Celiac angiogram: Celiac origin is widely patent. Hepatic, gastroduodenal, and splenic vasculature are patent. Arterial portal venous shunting demonstrated through the diffuse hepatic tumor vascularity.  Micro catheter was utilized to select the common hepatic artery selective common hepatic angiogram performed. This again demonstrates patency of the right and left hepatic arteries. Diffuse hepatic tumor vascularity demonstrated with early arterial portal venous shunting.  Micro catheter utilized to select the right hepatic artery. Selective right hepatic angiogram also confirms arterial portal venous shunting with diffuse tumor vascularity.  Right hepatic artery Bland embolization: From this location, right hepatic artery bland embolization was performed with 1 vial of 700- 900 micron embospheres. This is in preparation for chemo embolization to decrease the arterial portal venous shunting prior to delivering drug-eluting beads. Following right hepatic artery bland embolization, there is significant reduction in the arterial portal venous shunting.  Next, the micro catheter was utilized to select the left hepatic artery. Selective left hepatic angiogram and bland embolization performed.  Left hepatic bland embolization: Left hepatic artery is widely patent with diffuse arterial tumor vascularity and portal venous shunting into the left portal vein. Bland embolization performed with 1 vial of 700 - 900 micron embospheres to decrease the arterial portal venous shunting.  Micro catheter was utilized to re- select the right hepatic artery. Repeat right hepatic arteriogram confirms position with very little residual arterial portal venous shunting.  Drug-eluting beads trans arterial chemo embolization: From this location, 150 mg of doxorubicin drug-eluting beads were delivered predominately into the right hepatic artery. Minimal reflux into the  left hepatic artery. Entire dose was able to be delivered. Post embolization angiographic confirms preserved patency of right hepatic artery with antegrade flow.  Minor catheter and C2 catheter were removed. Right common femoral artery access site was closed with a 5 [place] StarClose device. No immediate complication. Overall, patient tolerated the procedure well. She will be admitted for overnight observation.  IMPRESSION: Successful right and left hepatic artery bland embolizations with 700- 900 micron embospheres to decrease arterial portal venous shunting through the tumor vascularity.  Successful drug-eluting beads trans arterial chemo embolization predominantly to the right hepatic tumor vascularity, total dose administered 150 mg doxorubicin.   Electronically Signed   By: Daryll Brod M.D.   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 The bleeding gastric varices were confirmed endoscopically and there was evidence of recent bleeding. She presents today for balloon occluded retrograde transvenous obliteration of her gastric varices.  EXAM: IR ULTRASOUND GUIDANCE VASC ACCESS RIGHT; ARTERIOGRAPHY; IR EMBO VENOUS NOT HEMORR HEMANG INC GUIDE ROADMAPPING; LEFT RENAL VENOGRAPHY; ADDITIONAL ARTERIOGRAPHY  Date: 11/28/2013  PROCEDURE: 1. Ultrasound-guided puncture of the right common femoral vein 2. Catheterization of the left renal vein with left renal venogram 3. Catheterization of the gastric renal shunt with venogram 4. Balloon occluded retrograde venogram of the gastric varices. The primary inflow is via the Short and posterior gastric veins. 5. Balloon occluded retrograde transvenous obliteration of the gastric variceal network. 6.  Coil embolization of the gastric renal shunt Interventional Radiologist: Criselda Peaches, MD and Corrie Mckusick,  DO  ANESTHESIA/SEDATION: Moderate (conscious) sedation was used. Four mg Versed, 200 mcg Fentanyl were administered intravenously. The patient's vital signs were monitored continuously by radiology nursing throughout the procedure.  Sedation Time: 90 minutes  FLUOROSCOPY TIME:  12 min 54 seconds (526.5 mGy)  CONTRAST:  80 mL OMNIPAQUE IOHEXOL 300 MG/ML  SOLN  TECHNIQUE: Informed consent was obtained from the patient following explanation of the procedure, risks, benefits and alternatives. The patient understands, agrees and consents for the procedure. All questions were addressed. A time out was performed.  Maximal barrier sterile technique utilized including caps, mask, sterile gowns, sterile gloves, large sterile drape, hand hygiene, and Betadine skin prep. The right groin was interrogated with ultrasound. Common femoral vein is widely patent. In images obtained and stored for the medical record. Local anesthesia was attained by infiltration with 1% lidocaine. A small dermatotomy was made. Under real-time sonographic guidance, a 21 gauge micropuncture needle was used to puncture the common femoral vein. With the assistance of a 4 [place] transitional micro sheath a 0.018 wire was exchanged for a 0.035 Bentson wire.  The skin tract was dilated to 10 [place] and a Cook 10 French TIPS sheath was advanced over the wire and positioned in the inferior vena cava. Using a C2 cobra catheter and Bentson wire of the left renal vein was catheterized. The Britta Mccreedy wire was exchanged for a [employer]. The tip sheath was advanced over the 5 French catheter and into the left renal vein. A left renal venogram was performed. There is a circumaortic renal vein. 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  Using standard coaxial technique, and angled 5 French catheter and glidewire were used to select the gas to renal shunt. The catheter was advanced into the gastrorenal shunt and a venogram was performed confirming  the anatomy of the gastric varices. A super stiff Amplatz wire was advanced into the gastrorenal shunt.  A [employer] scientific 11 mm standard balloon occlusion catheter was then advanced into the gastric renal shunt followed by the TIPS sheath. Balloon occlusion was performed and a balloon occluded venogram was obtained.  All 3 inflow vessels (left gastric, posterior gastric and short gastric veins ) were identified. The primary inflow is via the short and posterior gastric veins. There are no escape vessels. Flow is static within the gastric varix. The balloon was deflated allowing the contrast material to washout. The foam sclerosing was then performed in standard fashion using a 3:2:1 ratio of air to 3% sodium tetradecyl sulfate to lipiodol. Balloon occlusion was again performed and obliteration of the gastric varices was conducted by injecting 12 mL of the sclerosant mixture. This represents a total of 4 mL of 3% STS. The end point was identified when sclerosant entered the short gastric vein.  After a short to well time, a rapid transit micro catheter was advanced coaxially through the occlusion balloon and into the proximal aspect of the distal gastric renal shunt. Coil embolization was then performed using a combination of 10 and 12 mm interlocked detachable micro coils. The occlusion balloon was then left inflated for a total of 1 hr. After that, it was slowly deflated under real-time fluoroscopic guidance. The sclerosis and mass remains stable. No evidence of a mobility or migration of the sclerosing. The occlusion balloon was removed followed by the TIPS sheath. Hemostasis was attained by manual pressure. The patient  tolerated the procedure well.  COMPLICATIONS: None immediate  IMPRESSION: Technically successful coil accelerated balloon occluded retrograde transvenous obliteration (CA-BRTO) of gastric varices.  PLAN: 1. Bed rest today. 2. Begin with clear liquid diet and advance diet as tolerated throughout  the day. 3. CT scan in the morning to confirm successful occlusion of all gastric varices (scan is ordered). 4. If the patient is doing clinically well tomorrow and CT scan demonstrates successful occlusion of the gastric varices common discharge would be possible at the discretion of the admitting service. 5. Followup with Dr. Laurence Ferrari in [place] [facility] in 1 month. Signed,  Criselda Peaches, MD  [facility]  [facility]   Electronically Signed   By: Jacqulynn Cadet M.D.   On: 11/28/2013 11:42   Ir Angiogram Selective Each Additional Vessel  11/25/2013   CLINICAL DATA:  Multifocal diffuse hepatocellular carcinoma  EXAM: ULTRASOUND GUIDANCE FOR VASCULAR ACCESS  CELIAC ANGIOGRAM  COMMON HEPATIC, PROPER HEPATIC, RIGHT AND LEFT HEPATIC ANGIOGRAMS  RIGHT AND LEFT HEPATIC ARTERY BLAND EMBOLIZATIONS  RIGHT HEPATIC ARTERY DRUG-ELUTING BEADS TRANS ARTERIAL CHEMO EMBOLIZATION  Date:  11/3/201511/03/2013 11:52 am  Radiologist:  M. Daryll Brod, MD  Guidance:  Ultrasound and fluoroscopic  FLUOROSCOPY TIME:  12 min 36 seconds, 2,853 mGy  MEDICATIONS AND MEDICAL HISTORY: 4 mg versed, 200 mcg fentanyl, 20 mg Decadron, 4 mg Zofran, 3.375 g Zosyn administered within 1 hr of the procedure  ANESTHESIA/SEDATION: 1 hr 12 min  CONTRAST:  140 cc Omnipaque [id number]  COMPLICATIONS: None immediate  PROCEDURE: Informed consent was obtained from the patient following explanation of the procedure, risks, benefits and alternatives. The patient understands, agrees and consents for the procedure. All questions were addressed. A time out was performed.  Maximal barrier sterile technique utilized including caps, mask, sterile gowns, sterile gloves, large sterile drape, hand hygiene, and Betadine.  Under sterile conditions and local anesthesia, ultrasound micropuncture access was performed of the right common femoral artery. Five French sheath inserted over a Bentson guidewire. C2 catheter utilized to  select the celiac origin. Selective celiac angiogram performed.  Celiac angiogram: Celiac origin is widely patent. Hepatic, gastroduodenal, and splenic vasculature are patent. Arterial portal venous shunting demonstrated through the diffuse hepatic tumor vascularity.  Micro catheter was utilized to select the common hepatic artery selective common hepatic angiogram performed. This again demonstrates patency of the right and left hepatic arteries. Diffuse hepatic tumor vascularity demonstrated with early arterial portal venous shunting.  Micro catheter utilized to select the right hepatic artery. Selective right hepatic angiogram also confirms arterial portal venous shunting with diffuse tumor vascularity.  Right hepatic artery Bland embolization: From this location, right hepatic artery bland embolization was performed with 1 vial of 700- 900 micron embospheres. This is in preparation for chemo embolization to decrease the arterial portal venous shunting prior to delivering drug-eluting beads. Following right hepatic artery bland embolization, there is significant reduction in the arterial portal venous shunting.  Next, the micro catheter was utilized to select the left hepatic artery. Selective left hepatic angiogram and bland embolization performed.  Left hepatic bland embolization: Left hepatic artery is widely patent with diffuse arterial tumor vascularity and portal venous shunting into the left portal vein. Bland embolization performed with 1 vial of 700 - 900 micron embospheres to decrease the arterial portal venous shunting.  Micro catheter was utilized to re- select the right hepatic artery. Repeat right hepatic arteriogram confirms position with very little residual arterial portal venous shunting.  Drug-eluting beads  trans arterial chemo embolization: From this location, 150 mg of doxorubicin drug-eluting beads were delivered predominately into the right hepatic artery. Minimal reflux into the left  hepatic artery. Entire dose was able to be delivered. Post embolization angiographic confirms preserved patency of right hepatic artery with antegrade flow.  Minor catheter and C2 catheter were removed. Right common femoral artery access site was closed with a 5 [place] StarClose device. No immediate complication. Overall, patient tolerated the procedure well. She will be admitted for overnight observation.  IMPRESSION: Successful right and left hepatic artery bland embolizations with 700- 900 micron embospheres to decrease arterial portal venous shunting through the tumor vascularity.  Successful drug-eluting beads trans arterial chemo embolization predominantly to the right hepatic tumor vascularity, total dose administered 150 mg doxorubicin.   Electronically Signed   By: Daryll Brod M.D.   On: 11/25/2013 13:19   Ir Angiogram Selective Each Additional Vessel  11/25/2013   CLINICAL DATA:  Multifocal diffuse hepatocellular carcinoma  EXAM: ULTRASOUND GUIDANCE FOR VASCULAR ACCESS  CELIAC ANGIOGRAM  COMMON HEPATIC, PROPER HEPATIC, RIGHT AND LEFT HEPATIC ANGIOGRAMS  RIGHT AND LEFT HEPATIC ARTERY BLAND EMBOLIZATIONS  RIGHT HEPATIC ARTERY DRUG-ELUTING BEADS TRANS ARTERIAL CHEMO EMBOLIZATION  Date:  11/3/201511/03/2013 11:52 am  Radiologist:  M. Daryll Brod, MD  Guidance:  Ultrasound and fluoroscopic  FLUOROSCOPY TIME:  12 min 36 seconds, 2,853 mGy  MEDICATIONS AND MEDICAL HISTORY: 4 mg versed, 200 mcg fentanyl, 20 mg Decadron, 4 mg Zofran, 3.375 g Zosyn administered within 1 hr of the procedure  ANESTHESIA/SEDATION: 1 hr 12 min  CONTRAST:  140 cc Omnipaque [id number]  COMPLICATIONS: None immediate  PROCEDURE: Informed consent was obtained from the patient following explanation of the procedure, risks, benefits and alternatives. The patient understands, agrees and consents for the procedure. All questions were addressed. A time out was performed.  Maximal barrier sterile technique utilized including caps, mask, sterile  gowns, sterile gloves, large sterile drape, hand hygiene, and Betadine.  Under sterile conditions and local anesthesia, ultrasound micropuncture access was performed of the right common femoral artery. Five French sheath inserted over a Bentson guidewire. C2 catheter utilized to select the celiac origin. Selective celiac angiogram performed.  Celiac angiogram: Celiac origin is widely patent. Hepatic, gastroduodenal, and splenic vasculature are patent. Arterial portal venous shunting demonstrated through the diffuse hepatic tumor vascularity.  Micro catheter was utilized to select the common hepatic artery selective common hepatic angiogram performed. This again demonstrates patency of the right and left hepatic arteries. Diffuse hepatic tumor vascularity demonstrated with early arterial portal venous shunting.  Micro catheter utilized to select the right hepatic artery. Selective right hepatic angiogram also confirms arterial portal venous shunting with diffuse tumor vascularity.  Right hepatic artery Bland embolization: From this location, right hepatic artery bland embolization was performed with 1 vial of 700- 900 micron embospheres. This is in preparation for chemo embolization to decrease the arterial portal venous shunting prior to delivering drug-eluting beads. Following right hepatic artery bland embolization, there is significant reduction in the arterial portal venous shunting.  Next, the micro catheter was utilized to select the left hepatic artery. Selective left hepatic angiogram and bland embolization performed.  Left hepatic bland embolization: Left hepatic artery is widely patent with diffuse arterial tumor vascularity and portal venous shunting into the left portal vein. Bland embolization performed with 1 vial of 700 - 900 micron embospheres to decrease the arterial portal venous shunting.  Micro catheter was utilized to re- select the right hepatic artery. Repeat right  hepatic arteriogram confirms  position with very little residual arterial portal venous shunting.  Drug-eluting beads trans arterial chemo embolization: From this location, 150 mg of doxorubicin drug-eluting beads were delivered predominately into the right hepatic artery. Minimal reflux into the left hepatic artery. Entire dose was able to be delivered. Post embolization angiographic confirms preserved patency of right hepatic artery with antegrade flow.  Minor catheter and C2 catheter were removed. Right common femoral artery access site was closed with a 5 [place] StarClose device. No immediate complication. Overall, patient tolerated the procedure well. She will be admitted for overnight observation.  IMPRESSION: Successful right and left hepatic artery bland embolizations with 700- 900 micron embospheres to decrease arterial portal venous shunting through the tumor vascularity.  Successful drug-eluting beads trans arterial chemo embolization predominantly to the right hepatic tumor vascularity, total dose administered 150 mg doxorubicin.   Electronically Signed   By: Daryll Brod M.D.   On: 11/25/2013 13:19   Ir Angiogram Selective Each Additional Vessel  11/25/2013   CLINICAL DATA:  Multifocal diffuse hepatocellular carcinoma  EXAM: ULTRASOUND GUIDANCE FOR VASCULAR ACCESS  CELIAC ANGIOGRAM  COMMON HEPATIC, PROPER HEPATIC, RIGHT AND LEFT HEPATIC ANGIOGRAMS  RIGHT AND LEFT HEPATIC ARTERY BLAND EMBOLIZATIONS  RIGHT HEPATIC ARTERY DRUG-ELUTING BEADS TRANS ARTERIAL CHEMO EMBOLIZATION  Date:  11/3/201511/03/2013 11:52 am  Radiologist:  M. Daryll Brod, MD  Guidance:  Ultrasound and fluoroscopic  FLUOROSCOPY TIME:  12 min 36 seconds, 2,853 mGy  MEDICATIONS AND MEDICAL HISTORY: 4 mg versed, 200 mcg fentanyl, 20 mg Decadron, 4 mg Zofran, 3.375 g Zosyn administered within 1 hr of the procedure  ANESTHESIA/SEDATION: 1 hr 12 min  CONTRAST:  140 cc Omnipaque [id number]  COMPLICATIONS: None immediate  PROCEDURE: Informed consent was obtained from the patient  following explanation of the procedure, risks, benefits and alternatives. The patient understands, agrees and consents for the procedure. All questions were addressed. A time out was performed.  Maximal barrier sterile technique utilized including caps, mask, sterile gowns, sterile gloves, large sterile drape, hand hygiene, and Betadine.  Under sterile conditions and local anesthesia, ultrasound micropuncture access was performed of the right common femoral artery. Five French sheath inserted over a Bentson guidewire. C2 catheter utilized to select the celiac origin. Selective celiac angiogram performed.  Celiac angiogram: Celiac origin is widely patent. Hepatic, gastroduodenal, and splenic vasculature are patent. Arterial portal venous shunting demonstrated through the diffuse hepatic tumor vascularity.  Micro catheter was utilized to select the common hepatic artery selective common hepatic angiogram performed. This again demonstrates patency of the right and left hepatic arteries. Diffuse hepatic tumor vascularity demonstrated with early arterial portal venous shunting.  Micro catheter utilized to select the right hepatic artery. Selective right hepatic angiogram also confirms arterial portal venous shunting with diffuse tumor vascularity.  Right hepatic artery Bland embolization: From this location, right hepatic artery bland embolization was performed with 1 vial of 700- 900 micron embospheres. This is in preparation for chemo embolization to decrease the arterial portal venous shunting prior to delivering drug-eluting beads. Following right hepatic artery bland embolization, there is significant reduction in the arterial portal venous shunting.  Next, the micro catheter was utilized to select the left hepatic artery. Selective left hepatic angiogram and bland embolization performed.  Left hepatic bland embolization: Left hepatic artery is widely patent with diffuse arterial tumor vascularity and portal venous  shunting into the left portal vein. Bland embolization performed with 1 vial of 700 - 900 micron embospheres to decrease the arterial portal  venous shunting.  Micro catheter was utilized to re- select the right hepatic artery. Repeat right hepatic arteriogram confirms position with very little residual arterial portal venous shunting.  Drug-eluting beads trans arterial chemo embolization: From this location, 150 mg of doxorubicin drug-eluting beads were delivered predominately into the right hepatic artery. Minimal reflux into the left hepatic artery. Entire dose was able to be delivered. Post embolization angiographic confirms preserved patency of right hepatic artery with antegrade flow.  Minor catheter and C2 catheter were removed. Right common femoral artery access site was closed with a 5 [place] StarClose device. No immediate complication. Overall, patient tolerated the procedure well. She will be admitted for overnight observation.  IMPRESSION: Successful right and left hepatic artery bland embolizations with 700- 900 micron embospheres to decrease arterial portal venous shunting through the tumor vascularity.  Successful drug-eluting beads trans arterial chemo embolization predominantly to the right hepatic tumor vascularity, total dose administered 150 mg doxorubicin.   Electronically Signed   By: Daryll Brod M.D.   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 The bleeding gastric varices were confirmed endoscopically and there was evidence of recent bleeding. She presents today for balloon occluded retrograde transvenous obliteration of her gastric varices.  EXAM: IR ULTRASOUND GUIDANCE VASC ACCESS RIGHT; ARTERIOGRAPHY; IR EMBO VENOUS NOT HEMORR HEMANG INC GUIDE ROADMAPPING; LEFT RENAL VENOGRAPHY; ADDITIONAL ARTERIOGRAPHY  Date: 11/28/2013  PROCEDURE: 1.  Ultrasound-guided puncture of the right common femoral vein 2. Catheterization of the left renal vein with left renal venogram 3. Catheterization of the gastric renal shunt with venogram 4. Balloon occluded retrograde venogram of the gastric varices. The primary inflow is via the Short and posterior gastric veins. 5. Balloon occluded retrograde transvenous obliteration of the gastric variceal network. 6. Coil embolization of the gastric renal shunt Interventional Radiologist: Criselda Peaches, MD and Corrie Mckusick, DO  ANESTHESIA/SEDATION: Moderate (conscious) sedation was used. Four mg Versed, 200 mcg Fentanyl were administered intravenously. The patient's vital signs were monitored continuously by radiology nursing throughout the procedure.  Sedation Time: 90 minutes  FLUOROSCOPY TIME:  12 min 54 seconds (526.5 mGy)  CONTRAST:  80 mL OMNIPAQUE IOHEXOL 300 MG/ML  SOLN  TECHNIQUE: Informed consent was obtained from the patient following explanation of the procedure, risks, benefits and alternatives. The patient understands, agrees and consents for the procedure. All questions were addressed. A time out was performed.  Maximal barrier sterile technique utilized including caps, mask, sterile gowns, sterile gloves, large sterile drape, hand hygiene, and Betadine skin prep. The right groin was interrogated with ultrasound. Common femoral vein is widely patent. In images obtained and stored for the medical record. Local anesthesia was attained by infiltration with 1% lidocaine. A small dermatotomy was made. Under real-time sonographic guidance, a 21 gauge micropuncture needle was used to puncture the common femoral vein. With the assistance of a 4 [place] transitional micro sheath a 0.018 wire was exchanged for a 0.035 Bentson wire.  The skin tract was dilated to 10 [place] and a Cook 10 French TIPS sheath was advanced over the wire and positioned in the inferior vena cava. Using a C2 cobra catheter and Bentson wire of  the left renal vein was catheterized. The Britta Mccreedy wire was exchanged for a [employer]. The tip sheath was advanced over the 5 French catheter and into the left renal vein. A left renal venogram  was performed. There is a circumaortic renal vein. 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  Using standard coaxial technique, and angled 5 French catheter and glidewire were used to select the gas to renal shunt. The catheter was advanced into the gastrorenal shunt and a venogram was performed confirming the anatomy of the gastric varices. A super stiff Amplatz wire was advanced into the gastrorenal shunt.  A [employer] scientific 11 mm standard balloon occlusion catheter was then advanced into the gastric renal shunt followed by the TIPS sheath. Balloon occlusion was performed and a balloon occluded venogram was obtained.  All 3 inflow vessels (left gastric, posterior gastric and short gastric veins ) were identified. The primary inflow is via the short and posterior gastric veins. There are no escape vessels. Flow is static within the gastric varix. The balloon was deflated allowing the contrast material to washout. The foam sclerosing was then performed in standard fashion using a 3:2:1 ratio of air to 3% sodium tetradecyl sulfate to lipiodol. Balloon occlusion was again performed and obliteration of the gastric varices was conducted by injecting 12 mL of the sclerosant mixture. This represents a total of 4 mL of 3% STS. The end point was identified when sclerosant entered the short gastric vein.  After a short to well time, a rapid transit micro catheter was advanced coaxially through the occlusion balloon and into the proximal aspect of the distal gastric renal shunt. Coil embolization was then performed using a combination of 10 and 12 mm interlocked detachable micro coils. The occlusion balloon was then left inflated for a total of 1 hr. After that, it was slowly deflated under real-time fluoroscopic  guidance. The sclerosis and mass remains stable. No evidence of a mobility or migration of the sclerosing. The occlusion balloon was removed followed by the TIPS sheath. Hemostasis was attained by manual pressure. The patient tolerated the procedure well.  COMPLICATIONS: None immediate  IMPRESSION: Technically successful coil accelerated balloon occluded retrograde transvenous obliteration (CA-BRTO) of gastric varices.  PLAN: 1. Bed rest today. 2. Begin with clear liquid diet and advance diet as tolerated throughout the day. 3. CT scan in the morning to confirm successful occlusion of all gastric varices (scan is ordered). 4. If the patient is doing clinically well tomorrow and CT scan demonstrates successful occlusion of the gastric varices common discharge would be possible at the discretion of the admitting service. 5. Followup with Dr. Laurence Ferrari in [place] [facility] in 1 month. Signed,  Criselda Peaches, MD  [facility]  [facility]   Electronically Signed   By: Jacqulynn Cadet M.D.   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 The bleeding gastric varices were confirmed endoscopically and there was evidence of recent bleeding. She presents today for balloon occluded retrograde transvenous obliteration of her gastric varices.  EXAM: IR ULTRASOUND GUIDANCE VASC ACCESS RIGHT; ARTERIOGRAPHY; IR EMBO VENOUS NOT HEMORR HEMANG INC GUIDE ROADMAPPING; LEFT RENAL VENOGRAPHY; ADDITIONAL ARTERIOGRAPHY  Date: 11/28/2013  PROCEDURE: 1. Ultrasound-guided puncture of the right common femoral vein 2. Catheterization of the left renal vein with left renal venogram 3. Catheterization of the gastric renal shunt with venogram 4. Balloon occluded retrograde venogram of the gastric varices. The primary inflow is via the Short and posterior  gastric veins. 5. Balloon occluded retrograde transvenous obliteration of the gastric variceal  network. 6. Coil embolization of the gastric renal shunt Interventional Radiologist: Criselda Peaches, MD and Corrie Mckusick, DO  ANESTHESIA/SEDATION: Moderate (conscious) sedation was used. Four mg Versed, 200 mcg Fentanyl were administered intravenously. The patient's vital signs were monitored continuously by radiology nursing throughout the procedure.  Sedation Time: 90 minutes  FLUOROSCOPY TIME:  12 min 54 seconds (526.5 mGy)  CONTRAST:  80 mL OMNIPAQUE IOHEXOL 300 MG/ML  SOLN  TECHNIQUE: Informed consent was obtained from the patient following explanation of the procedure, risks, benefits and alternatives. The patient understands, agrees and consents for the procedure. All questions were addressed. A time out was performed.  Maximal barrier sterile technique utilized including caps, mask, sterile gowns, sterile gloves, large sterile drape, hand hygiene, and Betadine skin prep. The right groin was interrogated with ultrasound. Common femoral vein is widely patent. In images obtained and stored for the medical record. Local anesthesia was attained by infiltration with 1% lidocaine. A small dermatotomy was made. Under real-time sonographic guidance, a 21 gauge micropuncture needle was used to puncture the common femoral vein. With the assistance of a 4 [place] transitional micro sheath a 0.018 wire was exchanged for a 0.035 Bentson wire.  The skin tract was dilated to 10 [place] and a Cook 10 French TIPS sheath was advanced over the wire and positioned in the inferior vena cava. Using a C2 cobra catheter and Bentson wire of the left renal vein was catheterized. The Britta Mccreedy wire was exchanged for a [employer]. The tip sheath was advanced over the 5 French catheter and into the left renal vein. A left renal venogram was performed. There is a circumaortic renal vein. The gastric renal shunt is evidence as are the gastric  varices.  Using standard coaxial technique, and angled 5 French catheter and glidewire were used to select the gas to renal shunt. The catheter was advanced into the gastrorenal shunt and a venogram was performed confirming the anatomy of the gastric varices. A super stiff Amplatz wire was advanced into the gastrorenal shunt.  A [employer] scientific 11 mm standard balloon occlusion catheter was then advanced into the gastric renal shunt followed by the TIPS sheath. Balloon occlusion was performed and a balloon occluded venogram was obtained.  All 3 inflow vessels (left gastric, posterior gastric and short gastric veins ) were identified. The primary inflow is via the short and posterior gastric veins. There are no escape vessels. Flow is static within the gastric varix. The balloon was deflated allowing the contrast material to washout. The foam sclerosing was then performed in standard fashion using a 3:2:1 ratio of air to 3% sodium tetradecyl sulfate to lipiodol. Balloon occlusion was again performed and obliteration of the gastric varices was conducted by injecting 12 mL of the sclerosant mixture. This represents a total of 4 mL of 3% STS. The end point was identified when sclerosant entered the short gastric vein.  After a short to well time, a rapid transit micro catheter was advanced coaxially through the occlusion balloon and into the proximal aspect of the distal gastric renal shunt. Coil embolization was then performed using a combination of 10 and 12 mm interlocked detachable micro coils. The occlusion balloon was then left inflated for a total of 1 hr. After that, it was slowly deflated under real-time fluoroscopic guidance. The sclerosis and mass remains stable. No evidence of a mobility or migration of the sclerosing. The occlusion balloon was removed followed by the TIPS sheath. Hemostasis was attained by manual pressure. The  patient tolerated the procedure well.  COMPLICATIONS: None immediate   IMPRESSION: Technically successful coil accelerated balloon occluded retrograde transvenous obliteration (CA-BRTO) of gastric varices.  PLAN: 1. Bed rest today. 2. Begin with clear liquid diet and advance diet as tolerated throughout the day. 3. CT scan in the morning to confirm successful occlusion of all gastric varices (scan is ordered). 4. If the patient is doing clinically well tomorrow and CT scan demonstrates successful occlusion of the gastric varices common discharge would be possible at the discretion of the admitting service. 5. Followup with Dr. Laurence Ferrari in [place] clinic in 1 month. Signed,  Criselda Peaches, MD  [facility]  [facility]   Electronically Signed   By: Jacqulynn Cadet M.D.   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 The bleeding gastric varices were confirmed endoscopically and there was evidence of recent bleeding. She presents today for balloon occluded retrograde transvenous obliteration of her gastric varices.  EXAM: IR ULTRASOUND GUIDANCE VASC ACCESS RIGHT; ARTERIOGRAPHY; IR EMBO VENOUS NOT HEMORR HEMANG INC GUIDE ROADMAPPING; LEFT RENAL VENOGRAPHY; ADDITIONAL ARTERIOGRAPHY  Date: 11/28/2013  PROCEDURE: 1. Ultrasound-guided puncture of the right common femoral vein 2. Catheterization of the left renal vein with left renal venogram 3. Catheterization of the gastric renal shunt with venogram 4. Balloon occluded retrograde venogram of the gastric varices. The primary inflow is via the Short and posterior gastric veins. 5. Balloon occluded retrograde transvenous obliteration of the gastric variceal network. 6. Coil embolization of the gastric renal shunt Interventional Radiologist: Criselda Peaches, MD and Corrie Mckusick, DO  ANESTHESIA/SEDATION: Moderate (conscious) sedation was used.  Four mg Versed, 200 mcg Fentanyl were administered intravenously. The patient's vital signs were monitored continuously by radiology nursing throughout the procedure.  Sedation Time: 90 minutes  FLUOROSCOPY TIME:  12 min 54 seconds (526.5 mGy)  CONTRAST:  80 mL OMNIPAQUE IOHEXOL 300 MG/ML  SOLN  TECHNIQUE: Informed consent was obtained from the patient following explanation of the procedure, risks, benefits and alternatives. The patient understands, agrees and consents for the procedure. All questions were addressed. A time out was performed.  Maximal barrier sterile technique utilized including caps, mask, sterile gowns, sterile gloves, large sterile drape, hand hygiene, and Betadine skin prep. The right groin was interrogated with ultrasound. Common femoral vein is widely patent. In images obtained and stored for the medical record. Local anesthesia was attained by infiltration with 1% lidocaine. A small dermatotomy was made. Under real-time sonographic guidance, a 21 gauge micropuncture needle was used to puncture the common femoral vein. With the assistance of a 4 [place] transitional micro sheath a 0.018 wire was exchanged for a 0.035 Bentson wire.  The skin tract was dilated to 10 [place] and a Cook 10 French TIPS sheath was advanced over the wire and positioned in the inferior vena cava. Using a C2 cobra catheter and Bentson wire of the left renal vein was catheterized. The Britta Mccreedy wire was exchanged for a [employer]. The tip sheath was advanced over the 5 French catheter and into the left renal vein. A left renal venogram was performed. There is a circumaortic renal vein. 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  Using standard coaxial technique, and angled 5 French catheter and glidewire were used to select the gas to renal shunt. The catheter was advanced  into the gastrorenal shunt and a venogram was performed confirming the anatomy of the gastric varices. A super stiff Amplatz wire was  advanced into the gastrorenal shunt.  A [employer] scientific 11 mm standard balloon occlusion catheter was then advanced into the gastric renal shunt followed by the TIPS sheath. Balloon occlusion was performed and a balloon occluded venogram was obtained.  All 3 inflow vessels (left gastric, posterior gastric and short gastric veins ) were identified. The primary inflow is via the short and posterior gastric veins. There are no escape vessels. Flow is static within the gastric varix. The balloon was deflated allowing the contrast material to washout. The foam sclerosing was then performed in standard fashion using a 3:2:1 ratio of air to 3% sodium tetradecyl sulfate to lipiodol. Balloon occlusion was again performed and obliteration of the gastric varices was conducted by injecting 12 mL of the sclerosant mixture. This represents a total of 4 mL of 3% STS. The end point was identified when sclerosant entered the short gastric vein.  After a short to well time, a rapid transit micro catheter was advanced coaxially through the occlusion balloon and into the proximal aspect of the distal gastric renal shunt. Coil embolization was then performed using a combination of 10 and 12 mm interlocked detachable micro coils. The occlusion balloon was then left inflated for a total of 1 hr. After that, it was slowly deflated under real-time fluoroscopic guidance. The sclerosis and mass remains stable. No evidence of a mobility or migration of the sclerosing. The occlusion balloon was removed followed by the TIPS sheath. Hemostasis was attained by manual pressure. The patient tolerated the procedure well.  COMPLICATIONS: None immediate  IMPRESSION: Technically successful coil accelerated balloon occluded retrograde transvenous obliteration (CA-BRTO) of gastric varices.  PLAN: 1. Bed rest today. 2. Begin with clear liquid diet and advance diet as tolerated throughout the day. 3. CT scan in the morning to confirm successful occlusion  of all gastric varices (scan is ordered). 4. If the patient is doing clinically well tomorrow and CT scan demonstrates successful occlusion of the gastric varices common discharge would be possible at the discretion of the admitting service. 5. Followup with Dr. Laurence Ferrari in [place] clinic in 1 month. Signed,  Criselda Peaches, MD  [facility]  [facility]   Electronically Signed   By: Jacqulynn Cadet M.D.   On: 11/28/2013 11:42   Ir US Guide Vasc Access Right  11/25/2013   CLINICAL DATA:  Multifocal diffuse hepatocellular carcinoma  EXAM: ULTRASOUND GUIDANCE FOR VASCULAR ACCESS  CELIAC ANGIOGRAM  COMMON HEPATIC, PROPER HEPATIC, RIGHT AND LEFT HEPATIC ANGIOGRAMS  RIGHT AND LEFT HEPATIC ARTERY BLAND EMBOLIZATIONS  RIGHT HEPATIC ARTERY DRUG-ELUTING BEADS TRANS ARTERIAL CHEMO EMBOLIZATION  Date:  11/3/201511/03/2013 11:52 am  Radiologist:  M. Daryll Brod, MD  Guidance:  Ultrasound and fluoroscopic  FLUOROSCOPY TIME:  12 min 36 seconds, 2,853 mGy  MEDICATIONS AND MEDICAL HISTORY: 4 mg versed, 200 mcg fentanyl, 20 mg Decadron, 4 mg Zofran, 3.375 g Zosyn administered within 1 hr of the procedure  ANESTHESIA/SEDATION: 1 hr 12 min  CONTRAST:  140 cc Omnipaque 174  COMPLICATIONS: None immediate  PROCEDURE: Informed consent was obtained from the patient following explanation of the procedure, risks, benefits and alternatives. The patient understands, agrees and consents for the procedure. All questions were addressed. A time out was performed.  Maximal barrier sterile technique utilized including caps, mask, sterile gowns, sterile gloves, large sterile drape, hand hygiene, and Betadine.  Under sterile conditions and local anesthesia, ultrasound micropuncture access was performed of the right common femoral artery. Five French sheath inserted over a Bentson guidewire. C2 catheter utilized to select the celiac origin. Selective celiac angiogram performed.  Celiac angiogram:  Celiac origin is widely patent. Hepatic, gastroduodenal, and splenic vasculature are patent. Arterial portal venous shunting demonstrated through the diffuse hepatic tumor vascularity.  Micro catheter was utilized to select the common hepatic artery selective common hepatic angiogram performed. This again demonstrates patency of the right and left hepatic arteries. Diffuse hepatic tumor vascularity demonstrated with early arterial portal venous shunting.  Micro catheter utilized to select the right hepatic artery. Selective right hepatic angiogram also confirms arterial portal venous shunting with diffuse tumor vascularity.  Right hepatic artery Bland embolization: From this location, right hepatic artery bland embolization was performed with 1 vial of 700- 900 micron embospheres. This is in preparation for chemo embolization to decrease the arterial portal venous shunting prior to delivering drug-eluting beads. Following right hepatic artery bland embolization, there is significant reduction in the arterial portal venous shunting.  Next, the micro catheter was utilized to select the left hepatic artery. Selective left hepatic angiogram and bland embolization performed.  Left hepatic bland embolization: Left hepatic artery is widely patent with diffuse arterial tumor vascularity and portal venous shunting into the left portal vein. Bland embolization performed with 1 vial of 700 - 900 micron embospheres to decrease the arterial portal venous shunting.  Micro catheter was utilized to re- select the right hepatic artery. Repeat right hepatic arteriogram confirms position with very little residual arterial portal venous shunting.  Drug-eluting beads trans arterial chemo embolization: From this location, 150 mg of doxorubicin drug-eluting beads were delivered predominately into the right hepatic artery. Minimal reflux into the left hepatic artery. Entire dose was able to be delivered. Post embolization angiographic  confirms preserved patency of right hepatic artery with antegrade flow.  Minor catheter and C2 catheter were removed. Right common femoral artery access site was closed with a 5 [place] StarClose device. No immediate complication. Overall, patient tolerated the procedure well. She will be admitted for overnight observation.  IMPRESSION: Successful right and left hepatic artery bland embolizations with 700- 900 micron embospheres to decrease arterial portal venous shunting through the tumor vascularity.  Successful drug-eluting beads trans arterial chemo embolization predominantly to the right hepatic tumor vascularity, total dose administered 150 mg doxorubicin.   Electronically Signed   By: Daryll Brod M.D.   On: 11/25/2013 13:19   Dg Chest Port 1 View  11/26/2013   CLINICAL DATA:  Initial encounter for hypoxia after hepatic artery embolization yesterday.  EXAM: PORTABLE CHEST - 1 VIEW  COMPARISON:  01/16/2013.  FINDINGS: Seventeen. Lung volumes are low. There is vascular congestion with patchy bilateral interstitial and airspace opacity, left greater than right. Possible tiny left pleural effusion. Cardiopericardial silhouette is enlarged. Imaged bony structures of the thorax are intact.  IMPRESSION: Vascular congestion with patchy airspace disease, left greater than right. Asymmetric edema or diffuse infection would be a consideration.  Enlargement of the cardiopericardial silhouette.   Electronically Signed   By: Misty Stanley M.D.   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 The bleeding gastric varices were confirmed endoscopically and there was evidence of recent bleeding. She presents today for balloon occluded retrograde transvenous obliteration  of her gastric varices.  EXAM: IR ULTRASOUND GUIDANCE VASC ACCESS RIGHT;  ARTERIOGRAPHY; IR EMBO VENOUS NOT HEMORR HEMANG INC GUIDE ROADMAPPING; LEFT RENAL VENOGRAPHY; ADDITIONAL ARTERIOGRAPHY  Date: 11/28/2013  PROCEDURE: 1. Ultrasound-guided puncture of the right common femoral vein 2. Catheterization of the left renal vein with left renal venogram 3. Catheterization of the gastric renal shunt with venogram 4. Balloon occluded retrograde venogram of the gastric varices. The primary inflow is via the Short and posterior gastric veins. 5. Balloon occluded retrograde transvenous obliteration of the gastric variceal network. 6. Coil embolization of the gastric renal shunt Interventional Radiologist: Criselda Peaches, MD and Corrie Mckusick, DO  ANESTHESIA/SEDATION: Moderate (conscious) sedation was used. Four mg Versed, 200 mcg Fentanyl were administered intravenously. The patient's vital signs were monitored continuously by radiology nursing throughout the procedure.  Sedation Time: 90 minutes  FLUOROSCOPY TIME:  12 min 54 seconds (526.5 mGy)  CONTRAST:  80 mL OMNIPAQUE IOHEXOL 300 MG/ML  SOLN  TECHNIQUE: Informed consent was obtained from the patient following explanation of the procedure, risks, benefits and alternatives. The patient understands, agrees and consents for the procedure. All questions were addressed. A time out was performed.  Maximal barrier sterile technique utilized including caps, mask, sterile gowns, sterile gloves, large sterile drape, hand hygiene, and Betadine skin prep. The right groin was interrogated with ultrasound. Common femoral vein is widely patent. In images obtained and stored for the medical record. Local anesthesia was attained by infiltration with 1% lidocaine. A small dermatotomy was made. Under real-time sonographic guidance, a 21 gauge micropuncture needle was used to puncture the common femoral vein. With the assistance of a 4 [place] transitional micro sheath a 0.018 wire was exchanged for a 0.035 Bentson wire.  The skin tract was dilated to 10  [place] and a Cook 10 French TIPS sheath was advanced over the wire and positioned in the inferior vena cava. Using a C2 cobra catheter and Bentson wire of the left renal vein was catheterized. The Britta Mccreedy wire was exchanged for a [employer]. The tip sheath was advanced over the 5 French catheter and into the left renal vein. A left renal venogram was performed. There is a circumaortic renal vein. 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  Using standard coaxial technique, and angled 5 French catheter and glidewire were used to select the gas to renal shunt. The catheter was advanced into the gastrorenal shunt and a venogram was performed confirming the anatomy of the gastric varices. A super stiff Amplatz wire was advanced into the gastrorenal shunt.  A [employer] scientific 11 mm standard balloon occlusion catheter was then advanced into the gastric renal shunt followed by the TIPS sheath. Balloon occlusion was performed and a balloon occluded venogram was obtained.  All 3 inflow vessels (left gastric, posterior gastric and short gastric veins ) were identified. The primary inflow is via the short and posterior gastric veins. There are no escape vessels. Flow is static within the gastric varix. The balloon was deflated allowing the contrast material to washout. The foam sclerosing was then performed in standard fashion using a 3:2:1 ratio of air to 3% sodium tetradecyl sulfate to lipiodol. Balloon occlusion was again performed and obliteration of the gastric varices was conducted by injecting 12 mL of the sclerosant mixture. This represents a total of 4 mL of 3% STS. The end point was identified when sclerosant entered the short gastric vein.  After a short to well time, a rapid transit micro  catheter was advanced coaxially through the occlusion balloon and into the proximal aspect of the distal gastric renal shunt. Coil embolization was then performed using a combination of 10 and 12 mm  interlocked detachable micro coils. The occlusion balloon was then left inflated for a total of 1 hr. After that, it was slowly deflated under real-time fluoroscopic guidance. The sclerosis and mass remains stable. No evidence of a mobility or migration of the sclerosing. The occlusion balloon was removed followed by the TIPS sheath. Hemostasis was attained by manual pressure. The patient tolerated the procedure well.  COMPLICATIONS: None immediate  IMPRESSION: Technically successful coil accelerated balloon occluded retrograde transvenous obliteration (CA-BRTO) of gastric varices.  PLAN: 1. Bed rest today. 2. Begin with clear liquid diet and advance diet as tolerated throughout the day. 3. CT scan in the morning to confirm successful occlusion of all gastric varices (scan is ordered). 4. If the patient is doing clinically well tomorrow and CT scan demonstrates successful occlusion of the gastric varices common discharge would be possible at the discretion of the admitting service. 5. Followup with Dr. Laurence Ferrari in [place] [facility] in 1 month. Signed,  Criselda Peaches, MD  [facility]  [facility]   Electronically Signed   By: Jacqulynn Cadet M.D.   On: 11/28/2013 11:42   Ir Embo Tumor Organ Ischemia Infarct Inc Guide Roadmapping  11/25/2013   CLINICAL DATA:  Multifocal diffuse hepatocellular carcinoma  EXAM: ULTRASOUND GUIDANCE FOR VASCULAR ACCESS  CELIAC ANGIOGRAM  COMMON HEPATIC, PROPER HEPATIC, RIGHT AND LEFT HEPATIC ANGIOGRAMS  RIGHT AND LEFT HEPATIC ARTERY BLAND EMBOLIZATIONS  RIGHT HEPATIC ARTERY DRUG-ELUTING BEADS TRANS ARTERIAL CHEMO EMBOLIZATION  Date:  11/3/201511/03/2013 11:52 am  Radiologist:  M. Daryll Brod, MD  Guidance:  Ultrasound and fluoroscopic  FLUOROSCOPY TIME:  12 min 36 seconds, 2,853 mGy  MEDICATIONS AND MEDICAL HISTORY: 4 mg versed, 200 mcg fentanyl, 20 mg Decadron, 4 mg Zofran, 3.375 g Zosyn administered within 1 hr of the procedure   ANESTHESIA/SEDATION: 1 hr 12 min  CONTRAST:  140 cc Omnipaque 299  COMPLICATIONS: None immediate  PROCEDURE: Informed consent was obtained from the patient following explanation of the procedure, risks, benefits and alternatives. The patient understands, agrees and consents for the procedure. All questions were addressed. A time out was performed.  Maximal barrier sterile technique utilized including caps, mask, sterile gowns, sterile gloves, large sterile drape, hand hygiene, and Betadine.  Under sterile conditions and local anesthesia, ultrasound micropuncture access was performed of the right common femoral artery. Five French sheath inserted over a Bentson guidewire. C2 catheter utilized to select the celiac origin. Selective celiac angiogram performed.  Celiac angiogram: Celiac origin is widely patent. Hepatic, gastroduodenal, and splenic vasculature are patent. Arterial portal venous shunting demonstrated through the diffuse hepatic tumor vascularity.  Micro catheter was utilized to select the common hepatic artery selective common hepatic angiogram performed. This again demonstrates patency of the right and left hepatic arteries. Diffuse hepatic tumor vascularity demonstrated with early arterial portal venous shunting.  Micro catheter utilized to select the right hepatic artery. Selective right hepatic angiogram also confirms arterial portal venous shunting with diffuse tumor vascularity.  Right hepatic artery Bland embolization: From this location, right hepatic artery bland embolization was performed with 1 vial of 700- 900 micron embospheres. This is in preparation for chemo embolization to decrease the arterial portal venous shunting prior to delivering drug-eluting beads. Following right hepatic artery bland embolization, there is significant reduction in the arterial portal  venous shunting.  Next, the micro catheter was utilized to select the left hepatic artery. Selective left hepatic angiogram and  bland embolization performed.  Left hepatic bland embolization: Left hepatic artery is widely patent with diffuse arterial tumor vascularity and portal venous shunting into the left portal vein. Bland embolization performed with 1 vial of 700 - 900 micron embospheres to decrease the arterial portal venous shunting.  Micro catheter was utilized to re- select the right hepatic artery. Repeat right hepatic arteriogram confirms position with very little residual arterial portal venous shunting.  Drug-eluting beads trans arterial chemo embolization: From this location, 150 mg of doxorubicin drug-eluting beads were delivered predominately into the right hepatic artery. Minimal reflux into the left hepatic artery. Entire dose was able to be delivered. Post embolization angiographic confirms preserved patency of right hepatic artery with antegrade flow.  Minor catheter and C2 catheter were removed. Right common femoral artery access site was closed with a 5 [place] [employer] device. No immediate complication. Overall, patient tolerated the procedure well. She will be admitted for overnight observation.  IMPRESSION: Successful right and left hepatic artery bland embolizations with 700- 900 micron embospheres to decrease arterial portal venous shunting through the tumor vascularity.  Successful drug-eluting beads trans arterial chemo embolization predominantly to the right hepatic tumor vascularity, total dose administered 150 mg doxorubicin.   Electronically Signed   By: Daryll Brod M.D.   On: 11/25/2013 13:19    Labs:  CBC:  Recent Labs  11/26/13 0850 11/26/13 1230 11/27/13 0430 11/28/13 0426 11/29/13 0334  WBC 13.7*  --  9.4 7.3 7.6  HGB 11.1* 11.0* 10.3* 10.0* 9.6*  HCT 33.6* 33.3* 31.4* 30.3* 29.3*  PLT 248  --  249 205 179    COAGS:  Recent Labs  08/25/13 1111 09/16/13 0721 11/25/13 0800 11/29/13 0334  INR 0.97 1.06 1.12 1.28  APTT 29 30 24   --     BMP:  Recent Labs  11/26/13 0850  11/27/13 0430 11/28/13 0426 11/29/13 0334  NA 135* 137 139 135*  K 5.7* 3.5* 3.6* 4.1  CL 99 101 102 100  CO2 21 24 22 22   GLUCOSE 108* 76 86 106*  BUN 12 11 7 14   CALCIUM 8.9 8.2* 7.7* 7.7*  CREATININE 0.53 0.59 0.48* 0.58  GFRNONAA >90 >90 >90 >90  GFRAA >90 >90 >90 >90    LIVER FUNCTION TESTS:  Recent Labs  11/26/13 0850 11/27/13 0430 11/28/13 0426 11/29/13 0334  BILITOT 1.3* 1.4* 1.4* 1.3*  AST 228* 221* 155* 112*  ALT 95* 112* 87* 61*  ALKPHOS 174* 160* 152* 146*  PROT 8.0 6.8 6.2 6.0  ALBUMIN 3.0* 2.6* 2.4* 2.1*    Assessment and Plan:  BRTO performed in IR 11/6 Pt doing well overall Pain to breathe deeply Tmax 101.7 - now 99.1 For CT scan this am Will discuss with Dr Laurence Ferrari     I spent a total of 15 minutes face to face in clinical consultation/evaluation, greater than 50% of which was counseling/coordinating care for BRTO  Signed: Estefana Taylor A 11/29/2013, 8:15 AM

## 2013-11-29 NOTE — Progress Notes (Signed)
Physical Therapy Evaluation Patient Details Name: Whitney Patel MRN: [id number] DOB: [birth year] Today's Date: 11/29/2013   History of Present Illness  58 yo female, admitted for hematemesis on 11/25/13.  Dx multifocal hepatic carcinoma on chemo, Hep C, cirrhosis, HTN, DM.  Clinical Impression  Patient presents with need for Supplemental O2 (3L/min) to maintain saturation with activity.  Will benefit from continued activity and ambulation, assistance required for oxygen only, no physical assist required.  Anticipated that patient will improve cardiopulmonary function with time, no further skilled PT services required at this time.  Will DC PT order, please re-order if status changes.    Follow Up Recommendations Supervision for mobility/OOB (due to supplemental oxygen need)    Equipment Recommendations  None recommended by PT    Recommendations for Other Services       Precautions / Restrictions Precautions Precautions: Fall (Supplemental O2) Restrictions Weight Bearing Restrictions: No      Mobility  Bed Mobility Overal bed mobility: Independent                Transfers Overall transfer level: Independent                  Ambulation/Gait Ambulation/Gait assistance: Min guard Ambulation Distance (Feet): 200 Feet Assistive device: None Gait Pattern/deviations: Staggering left;Staggering right   Gait velocity interpretation: at or above normal speed for age/gender General Gait Details: Woozy, desaturated with activity creating stagger and grab for rail.  Stairs            Wheelchair Mobility    Modified Rankin (Stroke Patients Only)       Balance Overall balance assessment: Needs assistance Sitting-balance support: No upper extremity supported Sitting balance-Leahy Scale: Normal     Standing balance support: No upper extremity supported Standing balance-Leahy Scale: Good                               Pertinent Vitals/Pain  Pain Assessment: No/denies pain  On 2L/min, patient desaturated to 84% during ambulation.  Standing rest break and 3L/min to recover (per nursing instruction).  Maintained >90% with activity on 3L/min.    Home Living Family/patient expects to be discharged to:: Private residence Living Arrangements: Alone Available Help at Discharge: Friend(s);Available PRN/intermittently Type of Home: House Home Access: Level entry     Home Layout: One level Home Equipment: None      Prior Function Level of Independence: Independent         Comments: Works as Secretary/administrator at [employer].     Hand Dominance        Extremity/Trunk Assessment   Upper Extremity Assessment: Overall WFL for tasks assessed           Lower Extremity Assessment: Overall WFL for tasks assessed      Cervical / Trunk Assessment: Normal  Communication   Communication: No difficulties  Cognition Arousal/Alertness: Awake/alert Behavior During Therapy: Impulsive;WFL for tasks assessed/performed Overall Cognitive Status: Within Functional Limits for tasks assessed                      General Comments      Exercises        Assessment/Plan    PT Assessment Patent does not need any further PT services  PT Diagnosis Abnormality of gait   PT Problem List    PT Treatment Interventions     PT Goals (Current goals can be found in the Care Plan  section) Acute Rehab PT Goals Patient Stated Goal: To go home again PT Goal Formulation: All assessment and education complete, DC therapy    Frequency     Barriers to discharge        Co-evaluation               End of Session Equipment Utilized During Treatment: Oxygen;Gait belt Activity Tolerance: Patient tolerated treatment well Patient left: in bed;with call bell/phone within reach Nurse Communication: Mobility status;Other (comment) (Need supplemental O2 )         Time: 1100-1139 PT Time Calculation (min): 39 min   Charges:    PT Evaluation $Initial PT Evaluation Tier I: 1 Procedure PT Treatments $Gait Training: 8-22 mins   PT G Codes:          Dencil Cayson L 12/11/13, 11:49 AM

## 2013-11-29 NOTE — Plan of Care (Signed)
Problem: Phase II Progression Outcomes Goal: Discharge plan established Outcome: Completed/Met Date Met:  11/29/13     

## 2013-11-30 ENCOUNTER — Encounter (HOSPITAL_COMMUNITY): Payer: Self-pay

## 2013-11-30 ENCOUNTER — Inpatient Hospital Stay (HOSPITAL_COMMUNITY): Payer: No Typology Code available for payment source

## 2013-11-30 LAB — GLUCOSE, CAPILLARY
GLUCOSE-CAPILLARY: 109 mg/dL — AB (ref 70–99)
GLUCOSE-CAPILLARY: 133 mg/dL — AB (ref 70–99)
Glucose-Capillary: 157 mg/dL — ABNORMAL HIGH (ref 70–99)

## 2013-11-30 LAB — COMPREHENSIVE METABOLIC PANEL
ALK PHOS: 146 U/L — AB (ref 39–117)
ALT: 48 U/L — AB (ref 0–35)
AST: 87 U/L — AB (ref 0–37)
Albumin: 2.2 g/dL — ABNORMAL LOW (ref 3.5–5.2)
Anion gap: 11 (ref 5–15)
BILIRUBIN TOTAL: 1 mg/dL (ref 0.3–1.2)
BUN: 13 mg/dL (ref 6–23)
CO2: 22 meq/L (ref 19–32)
CREATININE: 0.5 mg/dL (ref 0.50–1.10)
Calcium: 7.7 mg/dL — ABNORMAL LOW (ref 8.4–10.5)
Chloride: 102 mEq/L (ref 96–112)
GFR calc Af Amer: >90 mL/min (ref >90)
Glucose, Bld: 114 mg/dL — ABNORMAL HIGH (ref 70–99)
POTASSIUM: 3.8 meq/L (ref 3.7–5.3)
Sodium: 135 mEq/L — ABNORMAL LOW (ref 137–147)
Total Protein: 5.9 g/dL — ABNORMAL LOW (ref 6.0–8.3)

## 2013-11-30 LAB — CBC
HCT: 27.1 % — ABNORMAL LOW (ref 36.0–46.0)
Hemoglobin: 9 g/dL — ABNORMAL LOW (ref 12.0–15.0)
MCH: 29.7 pg (ref 26.0–34.0)
MCHC: 33.2 g/dL (ref 30.0–36.0)
MCV: 89.4 fL (ref 78.0–100.0)
PLATELETS: 179 10*3/uL (ref 150–400)
RBC: 3.03 MIL/uL — ABNORMAL LOW (ref 3.87–5.11)
RDW: 16.4 % — AB (ref 11.5–15.5)
WBC: 7.1 10*3/uL (ref 4.0–10.5)

## 2013-11-30 LAB — TROPONIN I
Troponin I: <0.3 ng/mL (ref <0.30)
Troponin I: <0.3 ng/mL (ref <0.30)

## 2013-11-30 LAB — PROCALCITONIN: Procalcitonin: 0.22 ng/mL

## 2013-11-30 MED ORDER — LEVOFLOXACIN 500 MG PO TABS
500.0000 mg | ORAL_TABLET | Freq: Every day | ORAL | Status: DC
  Administered 2013-11-30 – 2013-12-02 (×3): 500 mg via ORAL
  Filled 2013-11-30 (×3): qty 1

## 2013-11-30 MED ORDER — PANTOPRAZOLE SODIUM 40 MG PO TBEC
40.0000 mg | DELAYED_RELEASE_TABLET | Freq: Two times a day (BID) | ORAL | Status: DC
  Administered 2013-11-30 – 2013-12-02 (×5): 40 mg via ORAL
  Filled 2013-11-30 (×5): qty 1

## 2013-11-30 MED ORDER — FUROSEMIDE 10 MG/ML IJ SOLN
20.0000 mg | Freq: Once | INTRAMUSCULAR | Status: AC
  Administered 2013-11-30: 20 mg via INTRAVENOUS
  Filled 2013-11-30: qty 2

## 2013-11-30 NOTE — Progress Notes (Signed)
[facility] hospitalist progress note. Chief complaint. Chest pain. History of present illness. This 58 year old female in hospital with hematemesis and community-acquired pneumonia versus diastolic heart failure versus pulmonary hypertension.Patient experienced a period of frequent coughing and subsequently experienced central chest pain which radiated to the back. I was ultimately notified of this and came to see the patient at bedside. A 12-lead EKG was done at the time and did not look significantly different than earlier EKG from this hospitalization. By time I arrived at the bedside the patient was chest pain-free. She denied any radiation to the left jaw or left arm. She denied any diaphoresis or associated nausea. Vital signs.temperature 98.4, pulse 71, respiration 20, blood pressure 106/70. O2 sats 96%. General appearance. Thin elderly female who is alert and in no distress. Cardiac. Rate and rhythm regular. Lungs. Breath sounds are diminished on the left. Abdomen. Soft with positive bowel sounds. No pain. Impression/plan. Problem #1. Chest pain. I suspect this due to irritation from coughing rather than cardiac in origin. Nonetheless we will obtain a repeat EKG in approximately 6 hours. We'll add troponin now and then every 6 hours for total of 3 sets. Patient will notify of any recurrent chest pain.

## 2013-11-30 NOTE — Plan of Care (Signed)
Problem: Phase III Progression Outcomes Goal: Pain controlled on oral analgesia Outcome: Completed/Met Date Met:  11/30/13 Goal: Discharge plan remains appropriate-arrangements made Outcome: Completed/Met Date Met:  11/30/13 Goal: Other Phase III Outcomes/Goals Outcome: Not Applicable Date Met:  88/11/03

## 2013-11-30 NOTE — Progress Notes (Signed)
PROGRESS NOTE  Whitney Patel LJQ:[id number] DOB: [birth year] DOA: 11/25/2013 PCP: Elberta Leatherwood, MD  HPI: The patient is a 58 y.o. year-old female with history of hepatitis C with cirrhosis and stage IIIA HCC, DM, HTN.She completed combined bland embolization and DEB/TACE on 09/16/2013 and had since recovered.Imaging demonstrated less enhancement of the multifocal tumor burden in the right hepatic lobe with areas of central necrosis. She presented on 11/3 for repeat hepatic DEB/TACE/bland embolization. She is followed by Dr.'s Alen Blew and [facility]. She tolerated her procedure well, however, postprocedure, she developed hematemesis and was vomiting approximately 50-75 mL's of bright red blood mixed with water.She underwent EGD on 11/4.  Subjective/ 24 H Interval events Chest pain overnight, resolved, negative troponin Complains of dyspnea with ambulation  Assessment/Plan: Principal Problem:   Hematemesis Active Problems:   DM (diabetes mellitus)   HTN, goal below 130/80   Cirrhosis   HCC (hepatocellular carcinoma)   Hepatocellular carcinoma   Gastric varices   Bleeding gastric varices    Hematemesis Last Hematemesis was on 11/3 per patient. Base line hgb 12.0. Hgb now stable.. EGD did not reveal a culprit lesion but gastric varices were noted. BRTO (Balloon Occluded Retrograde Transvenous Obliteration) was completed 11/6 by IR. Octreotide infusion discontinued.  - tolerating diet fairly well  Acute hypoxic respiratory failure/Community acquired pneumonia Versus Acute Diastolic CHF vs PHTN. - this is likely multifactorial with a combination of deconditioning and she endorses significantly decreased exercise tolerance, pulmonary hypertension, presence of community-acquired pneumonia, acute on chronic diastolic heart failure and also the possibility of mild aspiration with her hematemesis. She still in oxygen this morning. She has a history of tobacco abuse, started smoking at the age  of 63 and quit about couple of months ago, and is possible that she has underlying lung disease. - Changed her antibiotics to levofloxacin  - add duonebs every 6 hours scheduled - ambulate - still with persistent hypoxemia, she had a 2-D echo just last week which showed normal ejection fraction greater than diastolic dysfunction. - We will obtain CT scan of the chest without contrast to better evaluate lungs  Normocytic anemia Likely due to chronic disease and acute blood loss. Hemoglobin has remained stable. No need for transfusion at this time.  [place] s/p embolization x 2 Transaminitis noted post-procedure. Trending down well.  Hyperkalemia Potassium slightly low, improved after repletion  Essential hypertension Blood pressure stable. Holding HCTZ and ACEI for now.   DM type 2 HbA1c is 6.2. Diet controlled at home. Requiring minimal insulin (1 unit). Will discontinue cbgs and SSI.  Protein calorie malnutrition May be severe based on weight loss and cachexia. Nutrition consultation completed.. Regular diet with supplements once diet advanced. On megace. Will try Boost. Patient did not tolerate Ensure.  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 Recommend outpatient evaluation and management for same.  Leukocytosis Likely reactive from recent procedure or from the infectious process in the lung. Resolved.  Social: Patient concerned that she will be unable to return to work (due to SOB). Requests guidance about whether or not she would be eligible for disability.    Diet: regular Fluids: none DVT Prophylaxis: SCDs  Code Status: full code Family Communication: discussed with the patient  Disposition Plan: home when ready  Consultants:   GI  Procedures:  Interventional  radiology  Antibiotics Ceftriaxone 11/4 >> 11/7 Azithromycin 11/4 >> 11/7 Levofloxacin 11/7 >>   Studies  Dg Chest 2 View 11/28/2013 Improved lung aeration when compared to the prior study. There is still some residual lung base opacity which may reflect residual asymmetric edema, infectious infiltrates or atelectasis or a combination. No new abnormalities.   Ir Embo Venous Not [place] 11/28/2013 Technically successful coil accelerated balloon occluded retrograde transvenous obliteration (CA-BRTO) of gastric varices.  PLAN: 1. Bed rest today. 2. Begin with clear liquid diet and advance diet as tolerated throughout the day. 3. CT scan in the morning to confirm successful occlusion of all gastric varices (scan is ordered). 4. If the patient is doing clinically well tomorrow and CT scan demonstrates successful occlusion of the gastric varices common discharge would be possible at the discretion of the admitting service. 5. Followup with Dr. Laurence Ferrari in [place] [facility] in 1 month.   CT Abd Wo & W Cm:11/7 1. Successful obliteration of the gastric varices and coil embolization of gastro renal shunt. 2. Mild -moderate spill over of sclerosant into the portal splenic confluence, main portal vein and small peripheral branches of the right portal vein throughout the periphery of the right liver. No evidence of thrombus formation or flow limitation in the main portal vein. The splenic vein remains patent. This degree of spillover is highly likely clinically insignificant and will be expected to resolve over the coming weeks. Re-assessment will be made in 1 month with repeat CT evaluation. 3. Similar appearance of the liver with advanced multicentric hepatocellular carcinoma throughout the right hepatic lobe. There appears to be some interval decrease in the amount of tumor enhancement compared to 10/28/2013. 4. Punctate nonobstructing right nephrolithiasis. 5. Gallbladder distention with  mild gallbladder wall thickening. In the appropriate clinical setting, cholecystitis could have a similar appearance. 6. Additional ancillary findings as above.   Objective  Filed Vitals:   11/29/13 2103 11/29/13 2128 11/30/13 0449 11/30/13 0838  BP:  106/70 99/54   Pulse: 77 71 69   Temp:  98.4 F (36.9 C) 98.3 F (36.8 C)   TempSrc:  Oral Oral   Resp: 16 20 18    Height:      Weight:      SpO2: 97% 96% 95% 96%    Intake/Output Summary (Last 24 hours) at 11/30/13 1010 Last data filed at 11/30/13 0015  Gross per 24 hour  Intake    700 ml  Output      0 ml  Net    700 ml   Filed Weights   11/25/13 1230 11/27/13 1841  Weight: 58.968 kg (130 lb) 58.968 kg (130 lb)    Exam:  General:  No acute distress  Cardiovascular: regular rate and rhythm  Respiratory: decreased breath sounds throughout, no wheezing  Abdomen: soft, nontender to palpation  MSK: no peripheral edema  Neuro: grossly nonfocal  Data Reviewed: Basic Metabolic Panel:  Recent Labs Lab 11/26/13 0850 11/27/13 0430 11/28/13 0426 11/29/13 0334 11/30/13 0430  NA 135* 137 139 135* 135*  K 5.7* 3.5* 3.6* 4.1 3.8  CL 99 101 102 100 102  CO2 21 24 22 22 22   GLUCOSE 108* 76 86 106* 114*  BUN 12 11 7 14 13   CREATININE 0.53 0.59 0.48* 0.58 0.50  CALCIUM 8.9 8.2* 7.7* 7.7* 7.7*   Liver Function Tests:  Recent Labs Lab 11/26/13 0850 11/27/13 0430 11/28/13 0426 11/29/13 0334 11/30/13 0430  AST 228* 221* 155* 112* 87*  ALT 95* 112* 87* 61* 48*  ALKPHOS 174* 160* 152* 146* 146*  BILITOT 1.3* 1.4* 1.4* 1.3* 1.0  PROT 8.0 6.8 6.2 6.0 5.9*  ALBUMIN 3.0* 2.6* 2.4* 2.1* 2.2*   No results for input(s): LIPASE, AMYLASE in the last 168 hours. No results for input(s): AMMONIA in the last 168 hours. CBC:  Recent Labs Lab 11/25/13 0800 11/26/13 0850 11/26/13 1230 11/27/13 0430 11/28/13 0426 11/29/13 0334 11/30/13 0430  WBC 8.1 13.7*  --  9.4 7.3 7.6 7.1  NEUTROABS 5.4  --   --   --   --   --    --   HGB 10.4* 11.1* 11.0* 10.3* 10.0* 9.6* 9.0*  HCT 31.9* 33.6* 33.3* 31.4* 30.3* 29.3* 27.1*  MCV 90.1 90.1  --  89.7 88.9 89.6 89.4  PLT 242 248  --  249 205 179 179   Cardiac Enzymes:  Recent Labs Lab 11/30/13 0430  TROPONINI <0.30   BNP (last 3 results) No results for input(s): PROBNP in the last 8760 hours. CBG:  Recent Labs Lab 11/29/13 0616 11/29/13 1151 11/29/13 1707 11/29/13 2341 11/30/13 0656  GLUCAP 95 159* 150* 137* 109*    Recent Results (from the past 240 hour(s))  Culture, blood (routine x 2)     Status: None (Preliminary result)   Collection Time: 11/26/13  6:46 PM  Result Value Ref Range Status   Specimen Description BLOOD RIGHT ARM  Final   Special Requests BOTTLES DRAWN AEROBIC AND ANAEROBIC 10CC  Final   Culture  Setup Time   Final    11/26/2013 22:24 Performed at [facility]    Culture   Final           BLOOD CULTURE RECEIVED NO GROWTH TO DATE CULTURE WILL BE HELD FOR 5 DAYS BEFORE ISSUING A FINAL NEGATIVE REPORT Performed at [facility]    Report Status PENDING  Incomplete  Culture, blood (routine x 2)     Status: None (Preliminary result)   Collection Time: 11/26/13  6:53 PM  Result Value Ref Range Status   Specimen Description BLOOD RIGHT HAND  Final   Special Requests BOTTLES DRAWN AEROBIC AND ANAEROBIC 10CC  Final   Culture  Setup Time   Final    11/26/2013 22:24 Performed at [facility]    Culture   Final           BLOOD CULTURE RECEIVED NO GROWTH TO DATE CULTURE WILL BE HELD FOR 5 DAYS BEFORE ISSUING A FINAL NEGATIVE REPORT Performed at [facility]    Report Status PENDING  Incomplete     Scheduled Meds: . amLODipine  10 mg Oral q morning - 10a  . carvedilol  6.25 mg Oral BID WC  . docusate sodium  100 mg Oral BID  . ipratropium-albuterol  3 mL Nebulization TID  . lactose free nutrition  237 mL Oral TID WC  . levofloxacin  500 mg Oral Daily  . pantoprazole  40 mg Oral BID  . sodium  chloride  3 mL Intravenous Q12H   Continuous Infusions:    Time spent: 25 minutes  Marzetta Board, MD [facility] Pager [phone]. If 7 PM - 7 AM, please contact night-coverage at [email or web address], password [email or web address] 11/30/2013, 10:10 AM  LOS: 5 days

## 2013-11-30 NOTE — Progress Notes (Signed)
Referring Physician(s): Shick,Michael  Subjective:  Balloon occluded retrograde transvenous obliteration 11/6 in IR Pt has done well Post CT shows good result Pt still has occasional mild chest pain with deep breath and cough TRH seeing pt    Allergies: Review of patient's allergies indicates no known allergies.  Medications: Prior to Admission medications   Medication Sig Start Date End Date Taking? Authorizing Provider  amLODipine (NORVASC) 10 MG tablet Take 10 mg by mouth every morning.   Yes Historical Provider, MD  carvedilol (COREG) 12.5 MG tablet Take 6.25 mg by mouth 2 (two) times daily with a meal.   Yes Historical Provider, MD  hydrochlorothiazide (MICROZIDE) 12.5 MG capsule Take 1 capsule (12.5 mg total) by mouth 2 (two) times daily. 11/18/13  Yes Elberta Leatherwood, MD  lisinopril (PRINIVIL,ZESTRIL) 20 MG tablet Take 20 mg by mouth 2 (two) times daily.   Yes Historical Provider, MD  megestrol (MEGACE ES) 625 MG/5ML suspension Take 5 mLs (625 mg total) by mouth daily. 10/03/13   Wyatt Portela, MD  promethazine (PHENERGAN) 25 MG tablet Take 25 mg by mouth every 6 (six) hours as needed for nausea.  09/17/13   Historical Provider, MD    Review of Systems  Vital Signs: BP 99/54 mmHg  Pulse 69  Temp(Src) 98.3 F (36.8 C) (Oral)  Resp 18  Ht 5\' 5"  (1.651 m)  Wt 58.968 kg (130 lb)  BMI 21.63 kg/m2  SpO2 96%  Physical Exam  Pulmonary/Chest: Effort normal. She has wheezes.  Nursing note and vitals reviewed.   Imaging: Dg Chest 2 View  11/28/2013   CLINICAL DATA:  Patient complaining of on an all shortness of breath for 2 months. Patient has a history of hepatic cellular carcinoma, hepatitis-C and hypertension. Patient is a smoker.  EXAM: CHEST  2 VIEW  COMPARISON:  11/26/2013.  FINDINGS: Lung volumes are relatively low. There is central vascular congestion similar to the prior study. Patchy lower lung airspace opacity seen previously, has improved, with residual opacity  greater on the left. No convincing pleural effusion. No pneumothorax.  Cardiac silhouette is mildly enlarged. No mediastinal or hilar masses.  Bony thorax is grossly intact.  IMPRESSION: 1. Improved lung aeration when compared to the prior study. There is still some residual lung base opacity which may reflect residual asymmetric edema, infectious infiltrates or atelectasis or a combination. No new abnormalities.   Electronically Signed   By: Lajean Manes M.D.   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 The bleeding gastric varices were confirmed endoscopically and there was evidence of recent bleeding. She presents today for balloon occluded retrograde transvenous obliteration of her gastric varices.  EXAM: IR ULTRASOUND GUIDANCE VASC ACCESS RIGHT; ARTERIOGRAPHY; IR EMBO VENOUS NOT HEMORR HEMANG INC GUIDE ROADMAPPING; LEFT RENAL VENOGRAPHY; ADDITIONAL ARTERIOGRAPHY  Date: 11/28/2013  PROCEDURE: 1. Ultrasound-guided puncture of the right common femoral vein 2. Catheterization of the left renal vein with left renal venogram 3. Catheterization of the gastric renal shunt with venogram 4. Balloon occluded retrograde venogram of the gastric varices. The primary inflow is via the Short and posterior gastric veins. 5. Balloon occluded retrograde transvenous obliteration of the gastric variceal network. 6. Coil embolization of the gastric renal shunt Interventional Radiologist: Criselda Peaches, MD and Corrie Mckusick, DO  ANESTHESIA/SEDATION: Moderate (conscious) sedation was used. Four mg Versed, 200 mcg  Fentanyl were administered intravenously. The patient's vital signs were monitored continuously by radiology nursing throughout the procedure.  Sedation Time: 90 minutes  FLUOROSCOPY TIME:  12 min 54 seconds (526.5 mGy)  CONTRAST:  80 mL OMNIPAQUE IOHEXOL  300 MG/ML  SOLN  TECHNIQUE: Informed consent was obtained from the patient following explanation of the procedure, risks, benefits and alternatives. The patient understands, agrees and consents for the procedure. All questions were addressed. A time out was performed.  Maximal barrier sterile technique utilized including caps, mask, sterile gowns, sterile gloves, large sterile drape, hand hygiene, and Betadine skin prep. The right groin was interrogated with ultrasound. Common femoral vein is widely patent. In images obtained and stored for the medical record. Local anesthesia was attained by infiltration with 1% lidocaine. A small dermatotomy was made. Under real-time sonographic guidance, a 21 gauge micropuncture needle was used to puncture the common femoral vein. With the assistance of a 4 [place] transitional micro sheath a 0.018 wire was exchanged for a 0.035 Bentson wire.  The skin tract was dilated to 10 [place] and a Cook 10 French TIPS sheath was advanced over the wire and positioned in the inferior vena cava. Using a C2 cobra catheter and Bentson wire of the left renal vein was catheterized. The Britta Mccreedy wire was exchanged for a [employer]. The tip sheath was advanced over the 5 French catheter and into the left renal vein. A left renal venogram was performed. There is a circumaortic renal vein. 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  Using standard coaxial technique, and angled 5 French catheter and glidewire were used to select the gas to renal shunt. The catheter was advanced into the gastrorenal shunt and a venogram was performed confirming the anatomy of the gastric varices. A super stiff Amplatz wire was advanced into the gastrorenal shunt.  A [employer] 11 mm standard balloon occlusion catheter was then advanced into the gastric renal shunt followed by the TIPS sheath. Balloon occlusion was performed and a balloon occluded venogram was obtained.  All 3 inflow vessels  (left gastric, posterior gastric and short gastric veins ) were identified. The primary inflow is via the short and posterior gastric veins. There are no escape vessels. Flow is static within the gastric varix. The balloon was deflated allowing the contrast material to washout. The foam sclerosing was then performed in standard fashion using a 3:2:1 ratio of air to 3% sodium tetradecyl sulfate to lipiodol. Balloon occlusion was again performed and obliteration of the gastric varices was conducted by injecting 12 mL of the sclerosant mixture. This represents a total of 4 mL of 3% STS. The end point was identified when sclerosant entered the short gastric vein.  After a short to well time, a rapid transit micro catheter was advanced coaxially through the occlusion balloon and into the proximal aspect of the distal gastric renal shunt. Coil embolization was then performed using a combination of 10 and 12 mm interlocked detachable micro coils. The occlusion balloon was then left inflated for a total of 1 hr. After that, it was slowly deflated under real-time fluoroscopic guidance. The sclerosis and mass remains stable. No evidence of a mobility or migration of the sclerosing. The occlusion balloon was removed followed by the TIPS sheath. Hemostasis was attained by manual pressure. The patient tolerated the procedure well.  COMPLICATIONS: None immediate  IMPRESSION: Technically successful coil accelerated balloon occluded retrograde transvenous obliteration (CA-BRTO) of gastric varices.  PLAN: 1. Bed rest today.  2. Begin with clear liquid diet and advance diet as tolerated throughout the day. 3. CT scan in the morning to confirm successful occlusion of all gastric varices (scan is ordered). 4. If the patient is doing clinically well tomorrow and CT scan demonstrates successful occlusion of the gastric varices common discharge would be possible at the discretion of the admitting service. 5. Followup with Dr. Laurence Ferrari in  [place] clinic in 1 month. Signed,  Criselda Peaches, MD  [facility]  [facility]   Electronically Signed   By: Jacqulynn Cadet M.D.   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 The bleeding gastric varices were confirmed endoscopically and there was evidence of recent bleeding. She presents today for balloon occluded retrograde transvenous obliteration of her gastric varices.  EXAM: IR ULTRASOUND GUIDANCE VASC ACCESS RIGHT; ARTERIOGRAPHY; IR EMBO VENOUS NOT HEMORR HEMANG INC GUIDE ROADMAPPING; LEFT RENAL VENOGRAPHY; ADDITIONAL ARTERIOGRAPHY  Date: 11/28/2013  PROCEDURE: 1. Ultrasound-guided puncture of the right common femoral vein 2. Catheterization of the left renal vein with left renal venogram 3. Catheterization of the gastric renal shunt with venogram 4. Balloon occluded retrograde venogram of the gastric varices. The primary inflow is via the Short and posterior gastric veins. 5. Balloon occluded retrograde transvenous obliteration of the gastric variceal network. 6. Coil embolization of the gastric renal shunt Interventional Radiologist: Criselda Peaches, MD and Corrie Mckusick, DO  ANESTHESIA/SEDATION: Moderate (conscious) sedation was used. Four mg Versed, 200 mcg Fentanyl were administered intravenously. The patient's vital signs were monitored continuously by radiology nursing throughout the procedure.  Sedation Time: 90 minutes  FLUOROSCOPY TIME:  12 min 54 seconds (526.5 mGy)  CONTRAST:  80 mL OMNIPAQUE IOHEXOL 300 MG/ML  SOLN  TECHNIQUE: Informed consent was obtained from the patient following explanation of the procedure, risks, benefits and alternatives. The patient understands, agrees and consents for the procedure. All questions were addressed. A time out was performed.  Maximal barrier sterile technique  utilized including caps, mask, sterile gowns, sterile gloves, large sterile drape, hand hygiene, and Betadine skin prep. The right groin was interrogated with ultrasound. Common femoral vein is widely patent. In images obtained and stored for the medical record. Local anesthesia was attained by infiltration with 1% lidocaine. A small dermatotomy was made. Under real-time sonographic guidance, a 21 gauge micropuncture needle was used to puncture the common femoral vein. With the assistance of a 4 [place] transitional micro sheath a 0.018 wire was exchanged for a 0.035 Bentson wire.  The skin tract was dilated to 10 [place] and a Cook 10 French TIPS sheath was advanced over the wire and positioned in the inferior vena cava. Using a C2 cobra catheter and Bentson wire of the left renal vein was catheterized. The Britta Mccreedy wire was exchanged for a [employer]. The tip sheath was advanced over the 5 French catheter and into the left renal vein. A left renal venogram was performed. There is a circumaortic renal vein. 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  Using standard coaxial technique, and angled 5 French catheter and glidewire were used to select the gas to renal shunt. The catheter was advanced into the gastrorenal shunt and a venogram was performed confirming the anatomy of the gastric varices. A super stiff Amplatz wire was advanced into the gastrorenal shunt.  A [employer]  11 mm standard balloon occlusion catheter was then advanced into the gastric renal shunt followed by the TIPS sheath. Balloon occlusion was performed and a balloon occluded venogram was obtained.  All 3 inflow vessels (left gastric, posterior gastric and short gastric veins ) were identified. The primary inflow is via the short and posterior gastric veins. There are no escape vessels. Flow is static within the gastric varix. The balloon was deflated allowing the contrast material to washout. The foam sclerosing was then  performed in standard fashion using a 3:2:1 ratio of air to 3% sodium tetradecyl sulfate to lipiodol. Balloon occlusion was again performed and obliteration of the gastric varices was conducted by injecting 12 mL of the sclerosant mixture. This represents a total of 4 mL of 3% STS. The end point was identified when sclerosant entered the short gastric vein.  After a short to well time, a rapid transit micro catheter was advanced coaxially through the occlusion balloon and into the proximal aspect of the distal gastric renal shunt. Coil embolization was then performed using a combination of 10 and 12 mm interlocked detachable micro coils. The occlusion balloon was then left inflated for a total of 1 hr. After that, it was slowly deflated under real-time fluoroscopic guidance. The sclerosis and mass remains stable. No evidence of a mobility or migration of the sclerosing. The occlusion balloon was removed followed by the TIPS sheath. Hemostasis was attained by manual pressure. The patient tolerated the procedure well.  COMPLICATIONS: None immediate  IMPRESSION: Technically successful coil accelerated balloon occluded retrograde transvenous obliteration (CA-BRTO) of gastric varices.  PLAN: 1. Bed rest today. 2. Begin with clear liquid diet and advance diet as tolerated throughout the day. 3. CT scan in the morning to confirm successful occlusion of all gastric varices (scan is ordered). 4. If the patient is doing clinically well tomorrow and CT scan demonstrates successful occlusion of the gastric varices common discharge would be possible at the discretion of the admitting service. 5. Followup with Dr. Laurence Ferrari in [place] clinic in 1 month. Signed,  Criselda Peaches, MD  [facility]  [facility]   Electronically Signed   By: Jacqulynn Cadet M.D.   On: 11/28/2013 11:42   Ir Angiogram Follow Up Study  11/28/2013   CLINICAL DATA:  58 year old female with HCV cirrhosis  complicated by a hepatocellular carcinoma and bleeding gastric varices. The bleeding gastric varices were confirmed endoscopically and there was evidence of recent bleeding. She presents today for balloon occluded retrograde transvenous obliteration of her gastric varices.  EXAM: IR ULTRASOUND GUIDANCE VASC ACCESS RIGHT; ARTERIOGRAPHY; IR EMBO VENOUS NOT HEMORR HEMANG INC GUIDE ROADMAPPING; LEFT RENAL VENOGRAPHY; ADDITIONAL ARTERIOGRAPHY  Date: 11/28/2013  PROCEDURE: 1. Ultrasound-guided puncture of the right common femoral vein 2. Catheterization of the left renal vein with left renal venogram 3. Catheterization of the gastric renal shunt with venogram 4. Balloon occluded retrograde venogram of the gastric varices. The primary inflow is via the Short and posterior gastric veins. 5. Balloon occluded retrograde transvenous obliteration of the gastric variceal network. 6. Coil embolization of the gastric renal shunt Interventional Radiologist: Criselda Peaches, MD and Corrie Mckusick, DO  ANESTHESIA/SEDATION: Moderate (conscious) sedation was used. Four mg Versed, 200 mcg Fentanyl were administered intravenously. The patient's vital signs were monitored continuously by radiology nursing throughout the procedure.  Sedation Time: 90 minutes  FLUOROSCOPY TIME:  12 min 54 seconds (526.5 mGy)  CONTRAST:  80 mL OMNIPAQUE IOHEXOL 300 MG/ML  SOLN  TECHNIQUE: Informed consent was obtained from the patient following explanation of the procedure, risks, benefits and alternatives. The patient understands, agrees and consents for the procedure. All questions were addressed. A time out was performed.  Maximal barrier sterile technique utilized including caps, mask, sterile gowns, sterile gloves, large sterile drape, hand hygiene, and Betadine skin prep. The right groin was interrogated with ultrasound. Common femoral vein is widely patent. In images obtained and stored for the medical record. Local anesthesia was attained by  infiltration with 1% lidocaine. A small dermatotomy was made. Under real-time sonographic guidance, a 21 gauge micropuncture needle was used to puncture the common femoral vein. With the assistance of a 4 [place] transitional micro sheath a 0.018 wire was exchanged for a 0.035 Bentson wire.  The skin tract was dilated to 10 [place] and a Cook 10 French TIPS sheath was advanced over the wire and positioned in the inferior vena cava. Using a C2 cobra catheter and Bentson wire of the left renal vein was catheterized. The Britta Mccreedy wire was exchanged for a [employer]. The tip sheath was advanced over the 5 French catheter and into the left renal vein. A left renal venogram was performed. There is a circumaortic renal vein. 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  Using standard coaxial technique, and angled 5 French catheter and glidewire were used to select the gas to renal shunt. The catheter was advanced into the gastrorenal shunt and a venogram was performed confirming the anatomy of the gastric varices. A super stiff Amplatz wire was advanced into the gastrorenal shunt.  A [employer] 11 mm standard balloon occlusion catheter was then advanced into the gastric renal shunt followed by the TIPS sheath. Balloon occlusion was performed and a balloon occluded venogram was obtained.  All 3 inflow vessels (left gastric, posterior gastric and short gastric veins ) were identified. The primary inflow is via the short and posterior gastric veins. There are no escape vessels. Flow is static within the gastric varix. The balloon was deflated allowing the contrast material to washout. The foam sclerosing was then performed in standard fashion using a 3:2:1 ratio of air to 3% sodium tetradecyl sulfate to lipiodol. Balloon occlusion was again performed and obliteration of the gastric varices was conducted by injecting 12 mL of the sclerosant mixture. This represents a total of 4 mL of 3% STS. The end  point was identified when sclerosant entered the short gastric vein.  After a short to well time, a rapid transit micro catheter was advanced coaxially through the occlusion balloon and into the proximal aspect of the distal gastric renal shunt. Coil embolization was then performed using a combination of 10 and 12 mm interlocked detachable micro coils. The occlusion balloon was then left inflated for a total of 1 hr. After that, it was slowly deflated under real-time fluoroscopic guidance. The sclerosis and mass remains stable. No evidence of a mobility or migration of the sclerosing. The occlusion balloon was removed followed by the TIPS sheath. Hemostasis was attained by manual pressure. The patient tolerated the procedure well.  COMPLICATIONS: None immediate  IMPRESSION: Technically successful coil accelerated balloon occluded retrograde transvenous obliteration (CA-BRTO) of gastric varices.  PLAN: 1. Bed rest today. 2. Begin with clear liquid diet and advance diet as tolerated throughout the day. 3. CT scan in the morning to confirm successful occlusion of all gastric varices (scan is ordered). 4. If the patient is doing clinically well tomorrow and CT scan demonstrates  successful occlusion of the gastric varices common discharge would be possible at the discretion of the admitting service. 5. Followup with Dr. Laurence Ferrari in [place] clinic in 1 month. Signed,  Criselda Peaches, MD  [facility]  [facility]   Electronically Signed   By: Jacqulynn Cadet M.D.   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 The bleeding gastric varices were confirmed endoscopically and there was evidence of recent bleeding. She presents today for balloon occluded retrograde transvenous obliteration of her gastric varices.  EXAM: IR  ULTRASOUND GUIDANCE VASC ACCESS RIGHT; ARTERIOGRAPHY; IR EMBO VENOUS NOT HEMORR HEMANG INC GUIDE ROADMAPPING; LEFT RENAL VENOGRAPHY; ADDITIONAL ARTERIOGRAPHY  Date: 11/28/2013  PROCEDURE: 1. Ultrasound-guided puncture of the right common femoral vein 2. Catheterization of the left renal vein with left renal venogram 3. Catheterization of the gastric renal shunt with venogram 4. Balloon occluded retrograde venogram of the gastric varices. The primary inflow is via the Short and posterior gastric veins. 5. Balloon occluded retrograde transvenous obliteration of the gastric variceal network. 6. Coil embolization of the gastric renal shunt Interventional Radiologist: Criselda Peaches, MD and Corrie Mckusick, DO  ANESTHESIA/SEDATION: Moderate (conscious) sedation was used. Four mg Versed, 200 mcg Fentanyl were administered intravenously. The patient's vital signs were monitored continuously by radiology nursing throughout the procedure.  Sedation Time: 90 minutes  FLUOROSCOPY TIME:  12 min 54 seconds (526.5 mGy)  CONTRAST:  80 mL OMNIPAQUE IOHEXOL 300 MG/ML  SOLN  TECHNIQUE: Informed consent was obtained from the patient following explanation of the procedure, risks, benefits and alternatives. The patient understands, agrees and consents for the procedure. All questions were addressed. A time out was performed.  Maximal barrier sterile technique utilized including caps, mask, sterile gowns, sterile gloves, large sterile drape, hand hygiene, and Betadine skin prep. The right groin was interrogated with ultrasound. Common femoral vein is widely patent. In images obtained and stored for the medical record. Local anesthesia was attained by infiltration with 1% lidocaine. A small dermatotomy was made. Under real-time sonographic guidance, a 21 gauge micropuncture needle was used to puncture the common femoral vein. With the assistance of a 4 [place] transitional micro sheath a 0.018 wire was exchanged for a 0.035 Bentson wire.   The skin tract was dilated to 10 [place] and a Cook 10 French TIPS sheath was advanced over the wire and positioned in the inferior vena cava. Using a C2 cobra catheter and Bentson wire of the left renal vein was catheterized. The Britta Mccreedy wire was exchanged for a [employer]. The tip sheath was advanced over the 5 French catheter and into the left renal vein. A left renal venogram was performed. There is a circumaortic renal vein. 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  Using standard coaxial technique, and angled 5 French catheter and glidewire were used to select the gas to renal shunt. The catheter was advanced into the gastrorenal shunt and a venogram was performed confirming the anatomy of the gastric varices. A super stiff Amplatz wire was advanced into the gastrorenal shunt.  A [employer] 11 mm standard balloon occlusion catheter was then advanced into the gastric renal shunt followed by the TIPS sheath. Balloon occlusion was performed and a balloon occluded venogram was obtained.  All 3 inflow vessels (left gastric, posterior gastric and short gastric veins )  were identified. The primary inflow is via the short and posterior gastric veins. There are no escape vessels. Flow is static within the gastric varix. The balloon was deflated allowing the contrast material to washout. The foam sclerosing was then performed in standard fashion using a 3:2:1 ratio of air to 3% sodium tetradecyl sulfate to lipiodol. Balloon occlusion was again performed and obliteration of the gastric varices was conducted by injecting 12 mL of the sclerosant mixture. This represents a total of 4 mL of 3% STS. The end point was identified when sclerosant entered the short gastric vein.  After a short to well time, a rapid transit micro catheter was advanced coaxially through the occlusion balloon and into the proximal aspect of the distal gastric renal shunt. Coil embolization was then performed using a  combination of 10 and 12 mm interlocked detachable micro coils. The occlusion balloon was then left inflated for a total of 1 hr. After that, it was slowly deflated under real-time fluoroscopic guidance. The sclerosis and mass remains stable. No evidence of a mobility or migration of the sclerosing. The occlusion balloon was removed followed by the TIPS sheath. Hemostasis was attained by manual pressure. The patient tolerated the procedure well.  COMPLICATIONS: None immediate  IMPRESSION: Technically successful coil accelerated balloon occluded retrograde transvenous obliteration (CA-BRTO) of gastric varices.  PLAN: 1. Bed rest today. 2. Begin with clear liquid diet and advance diet as tolerated throughout the day. 3. CT scan in the morning to confirm successful occlusion of all gastric varices (scan is ordered). 4. If the patient is doing clinically well tomorrow and CT scan demonstrates successful occlusion of the gastric varices common discharge would be possible at the discretion of the admitting service. 5. Followup with Dr. Laurence Ferrari in [place] clinic in 1 month. Signed,  Criselda Peaches, MD  [facility]  [facility]   Electronically Signed   By: Jacqulynn Cadet M.D.   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 She is now 1 day postop Status post balloon occluded retrograde transvenous obliteration of gastric varices.  EXAM: CT ABDOMEN WITHOUT AND WITH CONTRAST  TECHNIQUE: Multidetector CT imaging of the abdomen was performed following the standard protocol before and following the bolus administration of intravenous contrast.  CONTRAST:  100 mL Omnipaque 300 administered intravenously  COMPARISON:  Preoperative CT scan 10/28/2013; intraoperative images 11 06/2013  FINDINGS: Lower Chest: Dependent atelectasis in both lower  lobes. Cardiomegaly with biatrial enlargement. Visualized distal thoracic esophagus is unremarkable.  Abdomen:  Similar appearance of the liver with hepatic cirrhosis and extensive infiltrative enhancing hepatocellular carcinoma throughout the right hemi liver with regions of central necrosis and vascular shunting. Previously identified index lesions demonstrate slightly decreased enhancement compared to prior. Precise measurements are difficult given the relative heterogeneity and a amorphous contours. However, 1 of the index lesions in the posterior aspect of hepatic segment 6 measures slightly smaller at 4.5 x 4.2 cm compared to 5.3 x 4.6 cm. The largest lesion in hepatic segment 5 is similar in size but demonstrates decreased enhancement in areas.  And mild gallbladder wall thickening.  Cholelithiasis also present.  Stable cyst in the upper pole of the right kidney. No hydronephrosis or enhancing renal mass.a punctate nonobstructing nephrolithiasis in the interpolar right kidney.  Visualized bowel is within normal limits. No free fluid or free air.  Bones/Soft Tissues: No acute fracture or aggressive appearing lytic or blastic osseous lesion  Vascular: Successful obliteration of gastric varices. Radiopaque sclerosing is present with an the intragastric gastric varices as well as the gas to renal shunt. There is a radiopaque coil pack at the superior aspect of the gas to renal shunt. Radiopaque sclerosis and extends into the short gastric vein and upper splenic vein. The splenic vein remains patent and opacified with contrast material. There is radiopaque sclerosis and layering and see dependently along the anterior wall of the portal splenic confluence and main portal vein. Again, no portal venous thrombus. The main portal vein remains patent. Small amounts of scattered radiopaque sclerosing throughout small branch vessels in the right portal vein. The left renal vein remains widely patent. Circumaortic left  renal vein again noted.  IMPRESSION: 1. Successful obliteration of the gastric varices and coil embolization of gastro renal shunt. 2. Mild -moderate spill over of sclerosant into the portal splenic confluence, main portal vein and small peripheral branches of the right portal vein throughout the periphery of the right liver. No evidence of thrombus formation or flow limitation in the main portal vein. The splenic vein remains patent. This degree of spillover is highly likely clinically insignificant and will be expected to resolve over the coming weeks. Re-assessment will be made in 1 month with repeat CT evaluation. 3. Similar appearance of the liver with advanced multicentric hepatocellular carcinoma throughout the right hepatic lobe. There appears to be some interval decrease in the amount of tumor enhancement compared to 10/28/2013. 4. Punctate nonobstructing right nephrolithiasis. 5. Gallbladder distention with mild gallbladder wall thickening. In the appropriate clinical setting, cholecystitis could have a similar appearance. 6. Additional ancillary findings as above. Signed,  Criselda Peaches, MD  [facility]  [facility]   Electronically Signed   By: Jacqulynn Cadet M.D.   On: 11/29/2013 10:00   Dg Chest Port 1 View  11/26/2013   CLINICAL DATA:  Initial encounter for hypoxia after hepatic artery embolization yesterday.  EXAM: PORTABLE CHEST - 1 VIEW  COMPARISON:  01/16/2013.  FINDINGS: Seventeen. Lung volumes are low. There is vascular congestion with patchy bilateral interstitial and airspace opacity, left greater than right. Possible tiny left pleural effusion. Cardiopericardial silhouette is enlarged. Imaged bony structures of the thorax are intact.  IMPRESSION: Vascular congestion with patchy airspace disease, left greater than right. Asymmetric edema or diffuse infection would be a consideration.  Enlargement of the cardiopericardial silhouette.    Electronically Signed   By: Misty Stanley M.D.   On: 11/26/2013 17:25   Ir Embo Venous Not Vance Guide Roadmapping  11/28/2013   CLINICAL DATA:  58 year old female with HCV cirrhosis complicated by a hepatocellular carcinoma and bleeding gastric varices. The bleeding gastric varices were confirmed endoscopically and there was evidence of recent bleeding. She presents today for balloon occluded retrograde transvenous obliteration of her gastric varices.  EXAM: IR ULTRASOUND GUIDANCE VASC ACCESS RIGHT; ARTERIOGRAPHY; IR EMBO VENOUS NOT HEMORR HEMANG INC GUIDE ROADMAPPING; LEFT RENAL VENOGRAPHY; ADDITIONAL ARTERIOGRAPHY  Date: 11/28/2013  PROCEDURE: 1. Ultrasound-guided puncture of the right common femoral vein 2. Catheterization of the left renal vein with left renal venogram 3. Catheterization of the gastric renal shunt with venogram 4. Balloon occluded retrograde venogram of the gastric varices. The primary inflow is via the Short and posterior gastric veins. 5. Balloon occluded retrograde transvenous obliteration of the gastric variceal network. 6. Coil embolization of the gastric renal shunt  Interventional Radiologist: Criselda Peaches, MD and Corrie Mckusick, DO  ANESTHESIA/SEDATION: Moderate (conscious) sedation was used. Four mg Versed, 200 mcg Fentanyl were administered intravenously. The patient's vital signs were monitored continuously by radiology nursing throughout the procedure.  Sedation Time: 90 minutes  FLUOROSCOPY TIME:  12 min 54 seconds (526.5 mGy)  CONTRAST:  80 mL OMNIPAQUE IOHEXOL 300 MG/ML  SOLN  TECHNIQUE: Informed consent was obtained from the patient following explanation of the procedure, risks, benefits and alternatives. The patient understands, agrees and consents for the procedure. All questions were addressed. A time out was performed.  Maximal barrier sterile technique utilized including caps, mask, sterile gowns, sterile gloves, large sterile drape, hand hygiene, and  Betadine skin prep. The right groin was interrogated with ultrasound. Common femoral vein is widely patent. In images obtained and stored for the medical record. Local anesthesia was attained by infiltration with 1% lidocaine. A small dermatotomy was made. Under real-time sonographic guidance, a 21 gauge micropuncture needle was used to puncture the common femoral vein. With the assistance of a 4 [place] transitional micro sheath a 0.018 wire was exchanged for a 0.035 Bentson wire.  The skin tract was dilated to 10 [place] and a Cook 10 French TIPS sheath was advanced over the wire and positioned in the inferior vena cava. Using a C2 cobra catheter and Bentson wire of the left renal vein was catheterized. The Britta Mccreedy wire was exchanged for a [employer]. The tip sheath was advanced over the 5 French catheter and into the left renal vein. A left renal venogram was performed. There is a circumaortic renal vein. 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  Using standard coaxial technique, and angled 5 French catheter and glidewire were used to select the gas to renal shunt. The catheter was advanced into the gastrorenal shunt and a venogram was performed confirming the anatomy of the gastric varices. A super stiff Amplatz wire was advanced into the gastrorenal shunt.  A [employer] scientific 11 mm standard balloon occlusion catheter was then advanced into the gastric renal shunt followed by the TIPS sheath. Balloon occlusion was performed and a balloon occluded venogram was obtained.  All 3 inflow vessels (left gastric, posterior gastric and short gastric veins ) were identified. The primary inflow is via the short and posterior gastric veins. There are no escape vessels. Flow is static within the gastric varix. The balloon was deflated allowing the contrast material to washout. The foam sclerosing was then performed in standard fashion using a 3:2:1 ratio of air to 3% sodium tetradecyl sulfate to  lipiodol. Balloon occlusion was again performed and obliteration of the gastric varices was conducted by injecting 12 mL of the sclerosant mixture. This represents a total of 4 mL of 3% STS. The end point was identified when sclerosant entered the short gastric vein.  After a short to well time, a rapid transit micro catheter was advanced coaxially through the occlusion balloon and into the proximal aspect of the distal gastric renal shunt. Coil embolization was then performed using a combination of 10 and 12 mm interlocked detachable micro coils. The occlusion balloon was then left inflated for a total of 1 hr. After that, it was slowly deflated under real-time fluoroscopic guidance. The sclerosis and mass remains stable. No evidence of a mobility or migration of the sclerosing. The occlusion balloon was removed followed by the TIPS sheath. Hemostasis was attained by manual pressure. The patient tolerated the procedure well.  COMPLICATIONS: None  immediate  IMPRESSION: Technically successful coil accelerated balloon occluded retrograde transvenous obliteration (CA-BRTO) of gastric varices.  PLAN: 1. Bed rest today. 2. Begin with clear liquid diet and advance diet as tolerated throughout the day. 3. CT scan in the morning to confirm successful occlusion of all gastric varices (scan is ordered). 4. If the patient is doing clinically well tomorrow and CT scan demonstrates successful occlusion of the gastric varices common discharge would be possible at the discretion of the admitting service. 5. Followup with Dr. Laurence Ferrari in [place] clinic in 1 month. Signed,  Criselda Peaches, MD  [facility]  [facility]   Electronically Signed   By: Jacqulynn Cadet M.D.   On: 11/28/2013 11:42    Labs:  CBC:  Recent Labs  11/27/13 0430 11/28/13 0426 11/29/13 0334 11/30/13 0430  WBC 9.4 7.3 7.6 7.1  HGB 10.3* 10.0* 9.6* 9.0*  HCT 31.4* 30.3* 29.3* 27.1*  PLT 249 205 179  179    COAGS:  Recent Labs  08/25/13 1111 09/16/13 0721 11/25/13 0800 11/29/13 0334  INR 0.97 1.06 1.12 1.28  APTT 29 30 24   --     BMP:  Recent Labs  11/27/13 0430 11/28/13 0426 11/29/13 0334 11/30/13 0430  NA 137 139 135* 135*  K 3.5* 3.6* 4.1 3.8  CL 101 102 100 102  CO2 24 22 22 22   GLUCOSE 76 86 106* 114*  BUN 11 7 14 13   CALCIUM 8.2* 7.7* 7.7* 7.7*  CREATININE 0.59 0.48* 0.58 0.50  GFRNONAA >90 >90 >90 >90  GFRAA >90 >90 >90 >90    LIVER FUNCTION TESTS:  Recent Labs  11/27/13 0430 11/28/13 0426 11/29/13 0334 11/30/13 0430  BILITOT 1.4* 1.4* 1.3* 1.0  AST 221* 155* 112* 87*  ALT 112* 87* 61* 48*  ALKPHOS 160* 152* 146* 146*  PROT 6.8 6.2 6.0 5.9*  ALBUMIN 2.6* 2.4* 2.1* 2.2*    Assessment and Plan:  Post BRTO 11/6 in IR Doing well post procedure Plan per [facility] We will see pt in clinic in 1 mo She will hear from scheduler    I spent a total of 15 minutes face to face in clinical consultation/evaluation, greater than 50% of which was counseling/coordinating care for BRTO  Signed: Aviva Wolfer A 11/30/2013, 9:16 AM

## 2013-12-01 DIAGNOSIS — R0902 Hypoxemia: Secondary | ICD-10-CM

## 2013-12-01 DIAGNOSIS — I864 Gastric varices: Secondary | ICD-10-CM

## 2013-12-01 DIAGNOSIS — K7460 Unspecified cirrhosis of liver: Secondary | ICD-10-CM

## 2013-12-01 DIAGNOSIS — R918 Other nonspecific abnormal finding of lung field: Secondary | ICD-10-CM

## 2013-12-01 DIAGNOSIS — C220 Liver cell carcinoma: Secondary | ICD-10-CM

## 2013-12-01 DIAGNOSIS — I279 Pulmonary heart disease, unspecified: Secondary | ICD-10-CM

## 2013-12-01 LAB — PREPARE RBC (CROSSMATCH)

## 2013-12-01 LAB — CBC
HCT: 26 % — ABNORMAL LOW (ref 36.0–46.0)
Hemoglobin: 8.7 g/dL — ABNORMAL LOW (ref 12.0–15.0)
MCH: 29.9 pg (ref 26.0–34.0)
MCHC: 33.5 g/dL (ref 30.0–36.0)
MCV: 89.3 fL (ref 78.0–100.0)
PLATELETS: 152 10*3/uL (ref 150–400)
RBC: 2.91 MIL/uL — AB (ref 3.87–5.11)
RDW: 16.5 % — ABNORMAL HIGH (ref 11.5–15.5)
WBC: 6.7 10*3/uL (ref 4.0–10.5)

## 2013-12-01 LAB — GLUCOSE, CAPILLARY
GLUCOSE-CAPILLARY: 102 mg/dL — AB (ref 70–99)
GLUCOSE-CAPILLARY: 116 mg/dL — AB (ref 70–99)
Glucose-Capillary: 92 mg/dL (ref 70–99)
Glucose-Capillary: 132 mg/dL — ABNORMAL HIGH (ref 70–99)
Glucose-Capillary: 129 mg/dL — ABNORMAL HIGH (ref 70–99)
Glucose-Capillary: 131 mg/dL — ABNORMAL HIGH (ref 70–99)

## 2013-12-01 LAB — ABO/RH: ABO/RH(D): O POS

## 2013-12-01 MED ORDER — SODIUM CHLORIDE 0.9 % IV SOLN: Freq: Once | INTRAVENOUS | Status: DC

## 2013-12-01 MED ORDER — FUROSEMIDE 10 MG/ML IJ SOLN: 20.0000 mg | Freq: Once | INTRAMUSCULAR | Status: DC

## 2013-12-01 MED ORDER — FUROSEMIDE 10 MG/ML IJ SOLN
20.0000 mg | Freq: Once | INTRAMUSCULAR | Status: AC
  Administered 2013-12-01: 20 mg via INTRAVENOUS
  Filled 2013-12-01: qty 2

## 2013-12-01 NOTE — Consult Note (Signed)
Name: Whitney Patel MRN: [id number] DOB: [birth year]    ADMISSION DATE:  11/25/2013 CONSULTATION DATE:  11/9  REFERRING MD :  Cruzita Lederer   CHIEF COMPLAINT:  Exertional hypoxia   BRIEF PATIENT DESCRIPTION:  58 year old female w sig h/o Hep C Cirrhosis and stage IIIA HCC, DM and HTN. She was s/p repeat Drug Eluding Beads and Transarterial Chemoembolization (DEB/TACE) on 11/3. Post-op course c/b hematemesis. EGD showed gastric varices but no culprit lesion. Underwent BRTO (baloon Occluded Retrograde Transvenous Obliteration) on 11/6. From GI stand-point improving. Has required O2 since post-op. Primary team treating for CAP vs aspiration. As of 11/9 still O2 dependent so PCCM asked to eval to  help explain her hypoxia and also comment on the significance of the PAH noted on ECHO.    SIGNIFICANT EVENTS  11/3: repeat DEB/TACE, post-procedure developed hematemesis and was admitted for further evaluation. 11/4: EGD: no culprit lesion/ did identify gastric varices  11/6:  underwent BRTO (baloon Occluded Retrograde Transvenous Obliteration) 11/8: no further bleeding. Tolerating advanced diet. Still O2 dependant.   STUDIES:  ECHO 11/4: EF 29-79%, systolic fxn nml, grade I ddysfxn, mild MR, LA mildly dilated, RV mildly dilated, RA mod dilated. Estimated Peak PAP 69 mmHg 11/8: CT chest: 1. Area of irregular, patchy density in each upper lobe. 2. Bilateral lower lobe atelectasis.3. Cardiomegaly. 4. Small nodules in the inferior aspect of the right upper lobe, along the minor fissure, as described above. These may represent subpleural lymph nodes.5. Previously noted changes of cirrhosis of the liver with hepatocellular carcinoma and sclerosing agent. 6. Stable upper pole right renal cyst and nonobstructing calculus.   HISTORY OF PRESENT ILLNESS:   58 year old female w sig h/o Hep C Cirrhosis and stage IIIA HCC, DM and HTN. She was s/p Drug Eluding Beads and Transarterial Chemoembolization (DEB/TACE)  initially 8/25 w/ initial imaging demonstrated less enhancement of the multifocal tumor burden in the right hepatic lobe with areas of central necrosis. She presented again on 11/3 for repeat DEB/TACE, post-procedure developed hematemesis and was admitted for further evaluation. She was started on octerotide and PPI. 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 For this she underwent BRTO (Balloon-occluded Retrograde Transvenous Obliteration) on 11/6. On 11/4 she was noted to have persistent O2 dependence which primary team had treated as possible CAP vs aspiration. She has been advancing from a GI stand-point but as of requiring supplemental oxygen as of 11/9. 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   PAST MEDICAL HISTORY :   has a past medical history of Hypertension; Hepatocellular carcinoma; Diabetes mellitus; and Hepatitis C.  has past surgical history that includes Cesarean section (1981); Hepatic artery embolization; and Esophagogastroduodenoscopy (N/A, 11/26/2013). Prior to Admission medications   Medication Sig Start Date End Date Taking? Authorizing Provider  amLODipine (NORVASC) 10 MG tablet Take 10 mg by mouth every morning.   Yes Historical Provider, MD  carvedilol (COREG) 12.5 MG tablet Take 6.25 mg by mouth 2 (two) times daily with a meal.   Yes Historical Provider, MD  hydrochlorothiazide (MICROZIDE) 12.5 MG capsule Take 1 capsule (12.5 mg total) by mouth 2 (two) times daily. 11/18/13  Yes Elberta Leatherwood, MD  lisinopril (PRINIVIL,ZESTRIL) 20 MG tablet Take 20 mg by mouth 2 (two) times daily.   Yes Historical Provider, MD  megestrol (MEGACE ES) 625 MG/5ML suspension Take 5 mLs (625 mg  total) by mouth daily. 10/03/13   Wyatt Portela, MD  promethazine (PHENERGAN) 25 MG tablet Take 25 mg by mouth every 6 (six) hours as needed for nausea.  09/17/13   Historical Provider, MD   No Known  Allergies  FAMILY HISTORY:  family history is negative for High blood pressure, Diabetes, and Cancer. SOCIAL HISTORY:  reports that she has been smoking Cigarettes.  She started smoking about 42 years ago. She has a 7.5 pack-year smoking history. She has never used smokeless tobacco. She reports that she does not drink alcohol or use illicit drugs.  REVIEW OF SYSTEMS bolds are positive:   Constitutional: Negative for fever, chills, weight loss, malaise/fatigue and diaphoresis.  HENT: Negative for hearing loss, ear pain, nosebleeds, congestion, sore throat, neck pain, tinnitus and ear discharge.   Eyes: Negative for blurred vision, double vision, photophobia, pain, discharge and redness.  Respiratory: Negative for cough, hemoptysis, sputum production, shortness of breath, chronic since June this year, wheezing and stridor.   Cardiovascular: Negative for chest pain, palpitations, orthopnea, claudication, leg swelling and PND.  Gastrointestinal: Negative for heartburn, nausea, vomiting, abdominal pain, diarrhea, constipation, blood in stool and melena.  Genitourinary: Negative for dysuria, urgency, frequency, hematuria and flank pain.  Musculoskeletal: Negative for myalgias, back pain, joint pain and falls.  Skin: Negative for itching and rash.  Neurological: Negative for dizziness, tingling, tremors, sensory change, speech change, focal weakness, seizures, loss of consciousness, weakness and headaches.  Endo/Heme/Allergies: Negative for environmental allergies and polydipsia. Does not bruise/bleed easily.  SUBJECTIVE: no acute distress.   VITAL SIGNS: Temp:  [98.8 F (37.1 C)-99.7 F (37.6 C)] 98.8 F (37.1 C) (11/09 0535) Pulse Rate:  [75-80] 75 (11/09 0535) Resp:  [18-20] 20 (11/09 0535) BP: (102-120)/(53-74) 119/70 mmHg (11/09 0535) SpO2:  [94 %-100 %] 97 % (11/09 0846)  PHYSICAL EXAMINATION: General:  Awake, alert, no distress Neuro:  No focal def  HEENT:  [place], no JVD    Cardiovascular:  IV/VI holosystolic  SEM w/ + S3  Lungs:  Decreased in bases Abdomen:  Non-tender + bowel sounds  Musculoskeletal:  No sig edema  Skin:  Warm, intact    Recent Labs Lab 11/28/13 0426 11/29/13 0334 11/30/13 0430  NA 139 135* 135*  K 3.6* 4.1 3.8  CL 102 100 102  CO2 22 22 22   BUN 7 14 13   CREATININE 0.48* 0.58 0.50  GLUCOSE 86 106* 114*    Recent Labs Lab 11/29/13 0334 11/30/13 0430 12/01/13 0530  HGB 9.6* 9.0* 8.7*  HCT 29.3* 27.1* 26.0*  WBC 7.6 7.1 6.7  PLT 179 179 152   Ct Chest Wo Contrast  11/30/2013   CLINICAL DATA:  Hemoptysis. The patient is a 58 y.o. year-old female with history of hepatitis C with cirrhosis and stage IIIA HCC, DM, HTN. She completed combined bland embolization and DEB/TACE on 09/16/2013 and had since recovered. Imaging demonstrated less enhancement of the multifocal tumor burden in the right hepatic lobe with areas of central necrosis. Coughing up blood Hx of hep c and hepatocellular ca  EXAM: CT CHEST WITHOUT CONTRAST  TECHNIQUE: Multidetector CT imaging of the chest was performed following the standard protocol without IV contrast.  COMPARISON:  Chest radiographs dated 11/28/2013 and abdomen CT obtained yesterday.  FINDINGS: There is a focal, irregular non patchy opacity in the anterior, medial aspect of the left upper lobe. This measures 3.1 x 1.5 cm on axial image number 13 and 1.9 cm in cephalocaudal dimension on sagittal image  number 63. There is a slightly more vague, similar irregular density in the right upper lobe, measuring 1.2 x 0.8 cm on axial image number 10 and 1.5 cm in cephalocaudal dimension on coronal image number 48.  Mild biapical bullous changes and pleural and parenchymal scarring are demonstrated. Also noted is bilateral lower lobe atelectasis.  There are 3 small nodular densities adjacent to the minor fissure in the inferior aspect of the right upper lobe. The largest measures 3 mm in maximum diameter and is  anteriorly located on image number 22. A smaller nodular density more laterally and posteriorly on that image, has an appearance suggesting a small focally prominent vessel on the coronal images. The third is more posteriorly and laterally located, measuring 2 mm in maximum diameter on axial image number 23.  No enlarged lymph nodes. Diffusely enlarged heart. Previously noted changes of cirrhosis of the liver in the upper abdomen. The previously demonstrated hepatocellular infiltration of the liver is not as well visualized without intravenous contrast today. An oval area low-density anteriorly on the right is unchanged, measuring 1.9 cm in maximum diameter on image number 51.  The previously demonstrated upper pole right renal cyst and 3 mm calculus are unchanged. Small punctate densities in the liver compatible with sclerosing agent are again demonstrated. Metallic coils with associated streak artifact are again demonstrated in the left upper abdomen. Thoracic, upper lumbar and lower cervical spine degenerative changes.  IMPRESSION: 1. Area of irregular, patchy density in each upper lobe, as described above. Differential considerations include in situ pulmonary adenocarcinoma and infection. 2. Bilateral lower lobe atelectasis. 3. Cardiomegaly. 4. Small nodules in the inferior aspect of the right upper lobe, along the minor fissure, as described above. These may represent subpleural lymph nodes. 5. Previously noted changes of cirrhosis of the liver with hepatocellular carcinoma and sclerosing agent. 6. Stable upper pole right renal cyst and nonobstructing calculus.   Electronically Signed   By: Enrique Sack M.D.   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 She is not a candidate for pulmonary vasodilator therapy No further eval suggested  PCCM will sign off. Please call if we can be of further assistance  Merton Border, MD ; [facility] [phone].  After 5:30 PM or weekends, call [phone]  12/01/2013, 10:11 AM

## 2013-12-01 NOTE — Progress Notes (Addendum)
NUTRITION FOLLOW UP  Intervention:   - Continue Boost Plus TID  Nutrition Dx:   Inadequate oral intake related to inability to eat as evidenced by Po: 0%; ongoing  Goal:   Intake of meals and supplements to meet >90% estimated needs; goal not met  Monitor:   PO/supplement intake, labs, weight trend, I/O's  Assessment:   Patient with hepatocellular carcinoma and cirrhosis due to hepatitis C. Patient admitted for hepatic DEB/TACE/bland embolization. Pt unavailable at time of visit.  Per chart review, pt eating well, however, minimal meal completion data. Noted PO: 0%. Pt has been advanced to a Heart Healthy diet. Noted Boost TID has been ordered with diet advancement- accepting about 50%. Noted refusals on MAR.  Labs reviewed. Na: 135, Calcium: 7.7, Glucose: 114. CBGS: 102-129.   Height: Ht Readings from Last 1 Encounters:  11/27/13 _0  (1.651 m)    Weight Status:   Wt Readings from Last 1 Encounters:  11/27/13 130 lb (58.968 kg)    Re-estimated needs:  Kcal: 1700-1900 Protein: 65-75 gm Fluid: >/=1.5L daily  Skin: closed groin incision from BRTO procedure  Diet Order: Diet Heart   Intake/Output Summary (Last 24 hours) at 12/01/13 1557 Last data filed at 12/01/13 0530  Gross per 24 hour  Intake    240 ml  Output      0 ml  Net    240 ml    Last BM: 11/30/13   Labs:   Recent Labs Lab 11/28/13 0426 11/29/13 0334 11/30/13 0430  NA 139 135* 135*  K 3.6* 4.1 3.8  CL 102 100 102  CO2 _1 BUN _2 CREATININE 0.48* 0.58 0.50  CALCIUM 7.7* 7.7* 7.7*  GLUCOSE 86 106* 114*    CBG (last 3)   Recent Labs  11/30/13 2357 12/01/13 0603 12/01/13 1200  GLUCAP 102* 116* 129*    Scheduled Meds: . sodium chloride   Intravenous Once  . amLODipine  10 mg Oral q morning - 10a  . carvedilol  6.25 mg Oral BID WC  . docusate sodium  100 mg Oral BID  . furosemide  20 mg Intravenous Once  . ipratropium-albuterol  3 mL Nebulization TID  . lactose free  nutrition  237 mL Oral TID WC  . levofloxacin  500 mg Oral Daily  . pantoprazole  40 mg Oral BID  . sodium chloride  3 mL Intravenous Q12H    Continuous Infusions:   Chrissy Ealey A. Jimmye Norman, RD, LDN Pager: [phone] After hours Pager: [phone]

## 2013-12-01 NOTE — Progress Notes (Signed)
PROGRESS NOTE  Whitney Patel BPZ:[id number] DOB: 1955/06/18 DOA: 11/25/2013 PCP: Elberta Leatherwood, MD  HPI: The patient is a 58 y.o. year-old female with history of hepatitis C with cirrhosis and stage IIIA HCC, DM, HTN.She completed combined bland embolization and DEB/TACE on 09/16/2013 and had since recovered.Imaging demonstrated less enhancement of the multifocal tumor burden in the right hepatic lobe with areas of central necrosis. She presented on 11/3 for repeat hepatic DEB/TACE/bland embolization. She is followed by Dr.'s Alen Blew and [facility] She tolerated her procedure well, however, postprocedure, she developed hematemesis and was vomiting approximately 50-75 mL's of bright red blood mixed with water.She underwent EGD on 11/4.  Subjective/ 24 H Interval events Still SOB.  Requiring 3L of oxygen.  Assessment/Plan: Principal Problem:   Hematemesis Active Problems:   DM (diabetes mellitus)   HTN, goal below 130/80   Cirrhosis   HCC (hepatocellular carcinoma)   Hepatocellular carcinoma   Gastric varices   Bleeding gastric varices  Hematemesis Last Hematemesis was on 11/3 per patient. Base line hgb 12.0. Hgb slowly trending down. EGD did not reveal a culprit lesion but gastric varices were noted. BRTO (Balloon Occluded Retrograde Transvenous Obliteration) was completed 11/6 by IR. Octreotide infusion discontinued.  -No frank bleeding, but hgb trending down slowly.  Will give 1 unit of blood 11/9. -tolerating diet fairly well  Persistent acute hypoxic respiratory failure -Community acquired pneumonia vs PHTN in the setting of significant hepatomegaly.  Patient unable to inspire deeply. -CT chest shows bilateral upper lobe densities ?adenocarcinoma insitu.  Will ask pulmonary for their recommendations. -She is on oxygen 3L this morning. -on levofloxacin; -duonebs every 6 hours scheduled; -ambulate -2-D echo just last week which showed normal ejection fraction greater than  diastolic dysfunction. Patient with +JVD. -IV lasix given without significant improvement.  Will give 1 unit of blood (with additional lasix) in an attempt to improve oxygenation. -Oncology (Dr. Alen Blew) will be curbsided about the patient's condition.  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   Pulmonary consulted 12/01/2013.  Normocytic anemia Likely due to chronic disease and acute blood loss. Hemoglobin has trended down slowly. Transfuse 1 unit 11/9.  [place] s/p embolization x 2 Transaminitis noted post-procedure. Trending down well.  Hyperkalemia Potassium slightly low, improved after repletion  Essential hypertension Blood pressure stable. Holding HCTZ and ACEI for now.   DM type 2 HbA1c is 6.2. Diet controlled at home. Requiring minimal insulin (1 unit). Will discontinue cbgs and SSI.  Protein calorie malnutrition May be severe based on weight loss and cachexia. Nutrition consultation completed.. Regular diet with supplements once diet advanced. On megace. Will try Boost. Patient did not tolerate Ensure.  Leukocytosis Likely reactive from recent procedure or from the infectious process in the lung. Resolved.  Social: Patient is a Retail buyer at [employer].  She is concerned that she will be unable to return to work (due to SOB). Requests guidance about whether or not she would be eligible for disability.    Diet: heart healthy Fluids: none DVT Prophylaxis: SCDs  Code Status: full code Family Communication: discussed with the patient  Disposition Plan: home when ready  Consultants:   GI  Procedures:  Interventional radiology   Antibiotics Ceftriaxone 11/4 >> 11/7 Azithromycin 11/4 >> 11/7 Levofloxacin 11/7 >>   Studies  Dg Chest 2 View 11/28/2013  Improved lung  aeration when compared to the prior study. There is still some residual lung base opacity which may reflect residual asymmetric edema, infectious infiltrates or atelectasis or a combination. No new abnormalities.   Ir Embo Venous Not [place] 11/28/2013 Technically successful coil accelerated balloon occluded retrograde transvenous obliteration (CA-BRTO) of gastric varices.  PLAN: 1. Bed rest today. 2. Begin with clear liquid diet and advance diet as tolerated throughout the day. 3. CT scan in the morning to confirm successful occlusion of all gastric varices (scan is ordered). 4. If the patient is doing clinically well tomorrow and CT scan demonstrates successful occlusion of the gastric varices common discharge would be possible at the discretion of the admitting service. 5. Followup with Dr. Laurence Ferrari in [place] [facility] in 1 month.   CT Abd Wo & W Cm:11/7 1. Successful obliteration of the gastric varices and coil embolization of gastro renal shunt. 2. Mild -moderate spill over of sclerosant into the portal splenic confluence, main portal vein and small peripheral branches of the right portal vein throughout the periphery of the right liver. No evidence of thrombus formation or flow limitation in the main portal vein. The splenic vein remains patent. This degree of spillover is highly likely clinically insignificant and will be expected to resolve over the coming weeks. Re-assessment will be made in 1 month with repeat CT evaluation. 3. Similar appearance of the liver with advanced multicentric hepatocellular carcinoma throughout the right hepatic lobe. There appears to be some interval decrease in the amount of tumor enhancement compared to 10/28/2013. 4. Punctate nonobstructing right nephrolithiasis. 5. Gallbladder distention with mild gallbladder wall thickening. In the appropriate clinical setting, cholecystitis could have a similar appearance. 6. Additional ancillary findings as  above.   Objective  Filed Vitals:   11/30/13 2033 11/30/13 2156 12/01/13 0535 12/01/13 0846  BP:  102/53 119/70   Pulse:  77 75   Temp:  99.7 F (37.6 C) 98.8 F (37.1 C)   TempSrc:  Oral Oral   Resp:  18 20   Height:      Weight:      SpO2: 95% 100% 96% 97%    Intake/Output Summary (Last 24 hours) at 12/01/13 1036 Last data filed at 12/01/13 0530  Gross per 24 hour  Intake    240 ml  Output      0 ml  Net    240 ml   Filed Weights   11/25/13 1230 11/27/13 1841  Weight: 58.968 kg (130 lb) 58.968 kg (130 lb)    Exam:  General:  Wd AA female, sitting on the side of the bed.  Head hung down.  No acute distress  Cardiovascular: regular rate and rhythm, +JVD  Respiratory: decreased breath sounds throughout, no wheezing.   Patient unable to inspire deeply.  Abdomen: soft, nontender to palpation, distended.  +Hepatomegaly.  MSK: no peripheral edema, 5/5 strength in each.  Neuro: grossly nonfocal  Data Reviewed: Basic Metabolic Panel:  Recent Labs Lab 11/26/13 0850 11/27/13 0430 11/28/13 0426 11/29/13 0334 11/30/13 0430  NA 135* 137 139 135* 135*  K 5.7* 3.5* 3.6* 4.1 3.8  CL 99 101 102 100 102  CO2 21 24 22 22 22   GLUCOSE 108* 76 86 106* 114*  BUN 12 11 7 14 13   CREATININE 0.53 0.59 0.48* 0.58 0.50  CALCIUM 8.9 8.2* 7.7* 7.7* 7.7*   Liver Function Tests:  Recent Labs Lab 11/26/13 0850 11/27/13 0430 11/28/13 0426 11/29/13 0334  11/30/13 0430  AST 228* 221* 155* 112* 87*  ALT 95* 112* 87* 61* 48*  ALKPHOS 174* 160* 152* 146* 146*  BILITOT 1.3* 1.4* 1.4* 1.3* 1.0  PROT 8.0 6.8 6.2 6.0 5.9*  ALBUMIN 3.0* 2.6* 2.4* 2.1* 2.2*   CBC:  Recent Labs Lab 11/25/13 0800  11/27/13 0430 11/28/13 0426 11/29/13 0334 11/30/13 0430 12/01/13 0530  WBC 8.1  < > 9.4 7.3 7.6 7.1 6.7  NEUTROABS 5.4  --   --   --   --   --   --   HGB 10.4*  < > 10.3* 10.0* 9.6* 9.0* 8.7*  HCT 31.9*  < > 31.4* 30.3* 29.3* 27.1* 26.0*  MCV 90.1  < > 89.7 88.9 89.6 89.4 89.3    PLT 242  < > 249 205 179 179 152  < > = values in this interval not displayed. Cardiac Enzymes:  Recent Labs Lab 11/30/13 0430 11/30/13 0918 11/30/13 1556  TROPONINI <0.30 <0.30 <0.30   CBG:  Recent Labs Lab 11/30/13 0656 11/30/13 1637 11/30/13 1846 11/30/13 2357 12/01/13 0603  GLUCAP 109* 133* 157* 102* 116*    Recent Results (from the past 240 hour(s))  Culture, blood (routine x 2)     Status: None (Preliminary result)   Collection Time: 11/26/13  6:46 PM  Result Value Ref Range Status   Specimen Description BLOOD RIGHT ARM  Final   Special Requests BOTTLES DRAWN AEROBIC AND ANAEROBIC 10CC  Final   Culture  Setup Time   Final    11/26/2013 22:24 Performed at [facility]    Culture   Final           BLOOD CULTURE RECEIVED NO GROWTH TO DATE CULTURE WILL BE HELD FOR 5 DAYS BEFORE ISSUING A FINAL NEGATIVE REPORT Performed at [facility]    Report Status PENDING  Incomplete  Culture, blood (routine x 2)     Status: None (Preliminary result)   Collection Time: 11/26/13  6:53 PM  Result Value Ref Range Status   Specimen Description BLOOD RIGHT HAND  Final   Special Requests BOTTLES DRAWN AEROBIC AND ANAEROBIC 10CC  Final   Culture  Setup Time   Final    11/26/2013 22:24 Performed at [facility]    Culture   Final           BLOOD CULTURE RECEIVED NO GROWTH TO DATE CULTURE WILL BE HELD FOR 5 DAYS BEFORE ISSUING A FINAL NEGATIVE REPORT Performed at [facility]    Report Status PENDING  Incomplete     Scheduled Meds: . sodium chloride   Intravenous Once  . amLODipine  10 mg Oral q morning - 10a  . carvedilol  6.25 mg Oral BID WC  . docusate sodium  100 mg Oral BID  . furosemide  20 mg Intravenous Once  . ipratropium-albuterol  3 mL Nebulization TID  . lactose free nutrition  237 mL Oral TID WC  . levofloxacin  500 mg Oral Daily  . pantoprazole  40 mg Oral BID  . sodium chloride  3 mL Intravenous Q12H   Continuous  Infusions:    Time spent: 30 minutes  Imogene Burn, [place] [facility] Pager [phone]. If 7 PM - 7 AM, please contact night-coverage at [email or web address], password [facility] 12/01/2013, 10:36 AM  LOS: 6 days

## 2013-12-01 NOTE — Progress Notes (Signed)
CARE MANAGEMENT NOTE 12/01/2013  Patient:  Whitney Patel, Whitney Patel   Account Number:  [id number]  Date Initiated:  11/27/2013  Documentation initiated by:  Marney Doctor  Subjective/Objective Assessment:   58 yo admitted with Hepatocellular carcinoma with Hematemesis.  HX of HTN and DM, Hep C with cirrhosis.     Action/Plan:   From home with children   Anticipated DC Date:  11/30/2013   Anticipated DC Plan:  [place]  CM consult  Other      Choice offered to / List presented to:             Status of service:  Completed, signed off Medicare Important Message given?   (If response is "NO", the following Medicare IM given date fields will be blank) Date Medicare IM given:   Medicare IM given by:   Date Additional Medicare IM given:   Additional Medicare IM given by:    Discharge Disposition:  HOME/SELF CARE  Per UR Regulation:  Reviewed for med. necessity/level of care/duration of stay  If discussed at [place] of Stay Meetings, dates discussed:    Comments:  12/01/2013 1500 NCM spoke to pt and states she was working at [employer] in [place]. States she believes she will not be able to return to work due to harsh chemical she uses each day which aggravates her condition. Her short term disability will not start until Jan 23, 2014. She has only been working for 3 weeks at [employer]. Provided pt with info brochure on how to apply for Social Security Disability. Pt states she will review and follow up after she is dc from hospital. She does have a PCP that she saw last month. NCM explained the importance of follow up post dc to qualify for SSDI. Waiting final recommendation for home. Pt may need oxygen for home. Jonnie Finner RN CCM Case Mgmt phone [phone]  11/27/13 Marney Doctor RN,BSN,NCM [phone] Chart reviewed and CM following for DC needs.

## 2013-12-01 NOTE — Progress Notes (Signed)
Occupational Therapy Evaluation Patient Details Name: Whitney Patel MRN: [id number] DOB: 03-28-1955 Today's Date: 12/01/2013    History of Present Illness 58 yo female, admitted for hematemesis on 11/25/13 after DEB/TACE/bland embolization.  Dx multifocal hepatic carcinoma on chemo, Hep C, cirrhosis, HTN, DM.   Clinical Impression   Pt mobilizing well during ADL, however desat to 85 on 2L. Encouraged pursed lip breathing and O2 Sats increased to 92.  No SOB noted. Began educating on energy conservation and recommendation to use 3 in 1 in shower. Pt asking questions about use of O2 at home and her ability to return to work. Pt expressing concerns over being able to pay her bills if she is not working. Recommend social work consult to help with coping during this illness. States her cousins are coming down from [place] to be with her for awhile after D/C.    Follow Up Recommendations  No OT follow up;Supervision - Intermittent    Equipment Recommendations  3 in 1 bedside comode    Recommendations for Other Services Other (comment) (Social Work for counseling)     Precautions / Restrictions Precautions Precautions: Other (comment) Precaution Comments: Watch O2 SAts Restrictions Weight Bearing Restrictions: No      Mobility Bed Mobility Overal bed mobility: Independent                Transfers Overall transfer level: Independent                    Balance Overall balance assessment: No apparent balance deficits (not formally assessed)                                          ADL Overall ADL's : Needs assistance/impaired              Desat to 85 with minimal activity on 2L. No dyspnea noted.                       Functional mobility during ADLs: Supervision/safety General ADL Comments: Pt overall set up for ADL. Began education on E consevation. Discussed safety concerns with managing O2 tubing during ADL     Vision                      Perception     Praxis      Pertinent Vitals/Pain Pain Assessment: No/denies pain  O2 85 2L during ADL/mobility increaed time to rebound to 92 with pursed lip breathing     Hand Dominance     Extremity/Trunk Assessment Upper Extremity Assessment Upper Extremity Assessment: Overall WFL for tasks assessed   Lower Extremity Assessment Lower Extremity Assessment: Defer to PT evaluation   Cervical / Trunk Assessment Cervical / Trunk Assessment: Other exceptions (forward head)   Communication Communication Communication: No difficulties   Cognition Arousal/Alertness: Awake/alert Behavior During Therapy: WFL for tasks assessed/performed Overall Cognitive Status: Within Functional Limits for tasks assessed                     General Comments   Pt states she is depressed about her situation.    Exercises       Shoulder Instructions      Home Living Family/patient expects to be discharged to:: Private residence Living Arrangements: Alone Available Help at Discharge: Friend(s);Available PRN/intermittently Type of Home: House Home Access: Level  entry     Home Layout: One level     Bathroom Shower/Tub: Tub/shower unit Shower/tub characteristics: Architectural technologist: Standard Bathroom Accessibility: Yes How Accessible: Accessible via walker Home Equipment: None          Prior Functioning/Environment Level of Independence: Independent        Comments: Works as Secretary/administrator at [employer].    OT Diagnosis: Generalized weakness   Maurie Boettcher, OTR/L  [phone] Problem List: Decreased knowledge of use of DME or AE;Cardiopulmonary status limiting activity;Decreased safety awareness;Decreased activity tolerance   OT Treatment/Interventions: Self-care/ADL training;Therapeutic exercise;Energy conservation;DME and/or AE instruction;Therapeutic activities;Patient/family education    OT Goals(Current goals can be found in the care  plan section) Acute Rehab OT Goals Patient Stated Goal: To go home again OT Goal Formulation: With patient Time For Goal Achievement: 12/15/13 Potential to Achieve Goals: Good  OT Frequency: Min 2X/week   Barriers to D/C: Decreased caregiver support          Co-evaluation              End of Session Equipment Utilized During Treatment: Oxygen (2L) Nurse Communication: Mobility status;Other (comment) (O2 Sats)  Activity Tolerance: Patient tolerated treatment well Patient left: in bed;with call bell/phone within reach   Time: 1000-1020 OT Time Calculation (min): 20 min Charges:  OT General Charges $OT Visit: 1 Procedure OT Evaluation $Initial OT Evaluation Tier I: 1 Procedure OT Treatments $Self Care/Home Management : 8-22 mins G-Codes:    Errica Dutil,HILLARY Dec 08, 2013, 10:32 AM

## 2013-12-02 DIAGNOSIS — K7460 Unspecified cirrhosis of liver: Secondary | ICD-10-CM

## 2013-12-02 DIAGNOSIS — R918 Other nonspecific abnormal finding of lung field: Secondary | ICD-10-CM | POA: Clinically undetermined

## 2013-12-02 DIAGNOSIS — I270 Primary pulmonary hypertension: Secondary | ICD-10-CM

## 2013-12-02 LAB — CBC
HEMATOCRIT: 28.9 % — AB (ref 36.0–46.0)
HEMOGLOBIN: 9.6 g/dL — AB (ref 12.0–15.0)
MCH: 29.6 pg (ref 26.0–34.0)
MCHC: 33.2 g/dL (ref 30.0–36.0)
MCV: 89.2 fL (ref 78.0–100.0)
Platelets: 120 10*3/uL — ABNORMAL LOW (ref 150–400)
RBC: 3.24 MIL/uL — ABNORMAL LOW (ref 3.87–5.11)
RDW: 16.2 % — ABNORMAL HIGH (ref 11.5–15.5)
WBC: 7.2 10*3/uL (ref 4.0–10.5)

## 2013-12-02 LAB — CULTURE, BLOOD (ROUTINE X 2)
CULTURE: NO GROWTH
Culture: NO GROWTH

## 2013-12-02 LAB — TYPE AND SCREEN
ABO/RH(D): O POS
ANTIBODY SCREEN: NEGATIVE
Unit division: 0

## 2013-12-02 LAB — GLUCOSE, CAPILLARY
Glucose-Capillary: 164 mg/dL — ABNORMAL HIGH (ref 70–99)
Glucose-Capillary: 167 mg/dL — ABNORMAL HIGH (ref 70–99)
Glucose-Capillary: 77 mg/dL (ref 70–99)

## 2013-12-02 LAB — HEPATIC FUNCTION PANEL
ALK PHOS: 159 U/L — AB (ref 39–117)
ALT: 36 U/L — AB (ref 0–35)
AST: 81 U/L — AB (ref 0–37)
Albumin: 2.3 g/dL — ABNORMAL LOW (ref 3.5–5.2)
BILIRUBIN TOTAL: 1.4 mg/dL — AB (ref 0.3–1.2)
Bilirubin, Direct: 0.8 mg/dL — ABNORMAL HIGH (ref 0.0–0.3)
Indirect Bilirubin: 0.6 mg/dL (ref 0.3–0.9)
TOTAL PROTEIN: 6.1 g/dL (ref 6.0–8.3)

## 2013-12-02 MED ORDER — ALBUTEROL SULFATE (2.5 MG/3ML) 0.083% IN NEBU: 2.5000 mg | INHALATION_SOLUTION | Freq: Four times a day (QID) | RESPIRATORY_TRACT | Status: AC | PRN

## 2013-12-02 MED ORDER — PANTOPRAZOLE SODIUM 40 MG PO TBEC: 40.0000 mg | DELAYED_RELEASE_TABLET | Freq: Two times a day (BID) | ORAL | Status: AC

## 2013-12-02 MED ORDER — LEVOFLOXACIN 500 MG PO TABS: 500.0000 mg | ORAL_TABLET | Freq: Every day | ORAL | Status: DC

## 2013-12-02 MED ORDER — HYDROCODONE-ACETAMINOPHEN 5-325 MG PO TABS: 1.0000 | ORAL_TABLET | ORAL | Status: AC | PRN

## 2013-12-02 MED ORDER — BOOST PLUS PO LIQD: 237.0000 mL | Freq: Three times a day (TID) | ORAL | Status: AC

## 2013-12-02 NOTE — Discharge Summary (Signed)
Physician Discharge Summary  Whitney Patel ZOX:[id number] DOB: 1955-06-22 DOA: 11/25/2013  PCP: Elberta Leatherwood, MD  Admit date: 11/25/2013 Discharge date: 12/02/2013  Time spent: 60 minutes  Recommendations for Outpatient Follow-up:  1. IR follow up in 1 month 2. Onc follow up in 1 week.  Patient will likely need follow up imaging of upper lobe opacities in 2-3 weeks. 3. PCP please check LFT, cbc, bmet in 1 week 4. Pulmonology follow up recommended for Pulm HTN. 5. D/C home with HHRN, Oxygen, Nebulizer Machine.   Discharge Diagnoses:  Principal Problem:   Hematemesis Active Problems:   DM (diabetes mellitus)   HTN, goal below 130/80   Cirrhosis   HCC (hepatocellular carcinoma)   Hepatocellular carcinoma   Gastric varices   Bleeding gastric varices   Opacity of lung on imaging study   Discharge Condition: stable on oxygen.  Poor prognosis.  Diet recommendation: regular diet.  Filed Weights   11/25/13 1230 11/27/13 1841  Weight: 58.968 kg (130 lb) 58.968 kg (130 lb)    History of present illness:  58 year old female w sig h/o Hep C Cirrhosis and stage IIIA HCC, DM and HTN. She was s/p repeat Drug Eluding Beads and Transarterial Chemoembolization (DEB/TACE) on 11/3. Post-op course c/b hematemesis. EGD showed gastric varices but no culprit lesion. Underwent BRTO (baloon Occluded Retrograde Transvenous Obliteration) on 11/6. From GI stand-point improving. Has required O2 since post-op.   Hospital Course:  Hematemesis Last Hematemesis was on 11/3 per patient. Base line hgb 12.0. Hgb slowly trending down. EGD did not reveal a culprit lesion but gastric varices were noted. BRTO (Balloon Occluded Retrograde Transvenous Obliteration) was completed 11/6 by IR. Octreotide infusion discontinued. No frank bleeding, but hgb trended down slowly. Gave 1 unit of blood 11/9. Tolerating diet fairly well.   Persistent acute hypoxic respiratory failure Community acquired pneumonia + PHTN  in the setting of significant hepatomegaly. Patient unable to inspire deeply.  Pulmonology was consulted and recommends oxygen 24/7.  CT chest shows bilateral upper lobe densities ?adenocarcinoma insitu. Patient will follow up with oncology post discharge.  She is on oxygen 3L as an inpatient.  She has been treated with Levaquin; nebs every 6 hours; and oxygen.  2-D echo just last week which showed normal ejection fraction greater than diastolic dysfunction. Patient with +JVD.  IV lasix given without significant improvement. Gave 1 unit of blood (with additional lasix) in an attempt to improve oxygenation.  Patient will d/c to home with oxygen therapy and home health RN.  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 Pulmonary consulted.  They recommended oxygen therapy 24 hours a day.  She is not a candidate for pulmonary vasodilator therapy.  Will recommend outpatient follow up with Pulmonology.  Normocytic anemia Likely due to chronic disease and acute blood loss. Hemoglobin has trended down slowly. Transfused 1 unit 11/9.  [place] s/p embolization x 2 Transaminitis noted post-procedure. Trending down well.  Hyperkalemia Likely due to lasix therapy.  Repleted with supplementation.  Essential hypertension Blood pressure stable. Continue only coreg at 6.25 bid, and amlodipine 10 mg daily.  DM type 2 HbA1c is 6.2. Diet controlled at home. D/C oral DM medications.  Protein calorie malnutrition May be severe based on weight loss and cachexia. Nutrition consultation completed. Given regular diet with supplements once diet advanced.  On megace. Will rx. Boost. Patient did not tolerate Ensure.  Leukocytosis Likely reactive from recent procedure or from the infectious process in the lung.  Resolved.   Procedures:  BRTO  Upper Endoscopy  2D echo Study Conclusions  - Left ventricle: The cavity size was normal. Systolic function was normal. The estimated ejection fraction was in the range of 60% to 65%. Wall motion was normal; there were no regional wall motion abnormalities. Doppler parameters are consistent with abnormal left ventricular relaxation (grade 1 diastolic dysfunction). - Mitral valve: There was mild regurgitation. - Left atrium: The atrium was mildly dilated. - Right ventricle: The cavity size was mildly dilated. Wall thickness was normal. - Right atrium: The atrium was moderately dilated. - Tricuspid valve: There was moderate regurgitation. - Pulmonary arteries: Systolic pressure was moderately to severely increased. PA peak pressure: 69 mm Hg (S).  Consultations:  Pulmonology Gastroenterology Interventional Radiology  Discharge Exam: Filed Vitals:   12/02/13 0937  BP: 104/52  Pulse: 65  Temp:   Resp:     General: Wd AA female, sitting on the side of the bed. Head hung down. No acute distress.  Daughter at bedside.  Cardiovascular: regular rate and rhythm  Respiratory: decreased breath sounds throughout, no wheezing. Patient unable to inspire deeply.  Abdomen: soft, nontender to palpation, distended. +Hepatomegaly.  MSK: no peripheral edema, 5/5 strength in each.  Neuro: grossly nonfocal   Discharge Instructions   Discharge Instructions    Diet - low sodium heart healthy    Complete by:  As directed      Increase activity slowly    Complete by:  As directed           Current Discharge Medication List    START taking these medications   Details  albuterol (PROVENTIL) (2.5 MG/3ML) 0.083% nebulizer solution Take 3 mLs (2.5 mg total) by nebulization every 6 (six) hours as needed for wheezing or shortness of breath. Qty: 75 mL, Refills: 12    HYDROcodone-acetaminophen (NORCO/VICODIN) 5-325 MG per tablet  Take 1-2 tablets by mouth every 4 (four) hours as needed for moderate pain. Qty: 30 tablet, Refills: 0    lactose free nutrition (BOOST PLUS) LIQD Take 237 mLs by mouth 3 (three) times daily with meals. Qty: 90 Can, Refills: 12    levofloxacin (LEVAQUIN) 500 MG tablet Take 1 tablet (500 mg total) by mouth daily. Qty: 3 tablet, Refills: 0    pantoprazole (PROTONIX) 40 MG tablet Take 1 tablet (40 mg total) by mouth 2 (two) times daily. Qty: 60 tablet, Refills: 3      CONTINUE these medications which have NOT CHANGED   Details  amLODipine (NORVASC) 10 MG tablet Take 10 mg by mouth every morning.    carvedilol (COREG) 12.5 MG tablet Take 6.25 mg by mouth 2 (two) times daily with a meal.    megestrol (MEGACE ES) 625 MG/5ML suspension Take 5 mLs (625 mg total) by mouth daily. Qty: 150 mL, Refills: 0    promethazine (PHENERGAN) 25 MG tablet Take 25 mg by mouth every 6 (six) hours as needed for nausea.       STOP taking these medications     hydrochlorothiazide (MICROZIDE) 12.5 MG capsule      lisinopril (PRINIVIL,ZESTRIL) 20 MG tablet        No Known Allergies Follow-up Information    Follow up with [facility], MD In 1 week.   Specialty:  Oncology   Contact information:   [address] [place]  [place] [address] [phone]       Follow up with [facility]Keag, Marylynn Pearson, MD In 2 weeks.   Specialty:  Family Medicine   Contact information:   [address] [place] [address] [phone]       Follow up with Interventional Radiology follow up in 1 month.      Follow up with Merton Border, MD In 2 weeks.   Specialty:  Pulmonary Disease   Why:  for pulmonary hypertension   Contact information:   [place] [address] [phone]        The results of significant diagnostics from this hospitalization (including imaging, microbiology, ancillary and laboratory) are listed below for reference.    Significant Diagnostic Studies: Dg Chest 2 View  11/28/2013      IMPRESSION: 1. Improved lung aeration when compared to the prior study. There is still some residual lung base opacity which may reflect residual asymmetric edema, infectious infiltrates or atelectasis or a combination. No new abnormalities.   Electronically Signed   By: Lajean Manes M.D.   On: 11/28/2013 14:24   Ct Chest Wo Contrast  11/30/2013   .  IMPRESSION: 1. Area of irregular, patchy density in each upper lobe, as described above. Differential considerations include in situ pulmonary adenocarcinoma and infection. 2. Bilateral lower lobe atelectasis. 3. Cardiomegaly. 4. Small nodules in the inferior aspect of the right upper lobe, along the minor fissure, as described above. These may represent subpleural lymph nodes. 5. Previously noted changes of cirrhosis of the liver with hepatocellular carcinoma and sclerosing agent. 6. Stable upper pole right renal cyst and nonobstructing calculus.   Electronically Signed   By: Enrique Sack M.D.   On: 11/30/2013 11:59   Ir Angiogram Visceral Selective  11/25/2013   CLINICAL DATA:  Multifocal diffuse hepatocellular carcinoma  EXAM: ULTRASOUND GUIDANCE FOR VASCULAR ACCESS  CELIAC ANGIOGRAM  COMMON HEPATIC, PROPER HEPATIC, RIGHT AND LEFT HEPATIC ANGIOGRAMS  RIGHT AND LEFT HEPATIC ARTERY BLAND EMBOLIZATIONS  RIGHT HEPATIC ARTERY DRUG-ELUTING BEADS TRANS ARTERIAL CHEMO EMBOLIZATION  Date:  11/3/201511/03/2013 11:52 am  Radiologist:  M. Daryll Brod, MD  Guidance:  Ultrasound and fluoroscopic  FLUOROSCOPY TIME:  12 min 36 seconds, 2,853 mGy  MEDICATIONS AND MEDICAL HISTORY: 4 mg versed, 200 mcg fentanyl, 20 mg Decadron, 4 mg Zofran, 3.375 g Zosyn administered within 1 hr of the procedure  ANESTHESIA/SEDATION: 1 hr 12 min  CONTRAST:  140 cc Omnipaque 160  COMPLICATIONS: None immediate  PROCEDURE: Informed consent was obtained from the patient following explanation of the procedure, risks, benefits and alternatives. The patient understands, agrees and consents for the  procedure. All questions were addressed. A time out was performed.  Maximal barrier sterile technique utilized including caps, mask, sterile gowns, sterile gloves, large sterile drape, hand hygiene, and Betadine.  Under sterile conditions and local anesthesia, ultrasound micropuncture access was performed of the right common femoral artery. Five French sheath inserted over a Bentson guidewire. C2 catheter utilized to select the celiac origin. Selective celiac angiogram performed.  Celiac angiogram: Celiac origin is widely patent. Hepatic, gastroduodenal, and splenic vasculature are patent. Arterial portal venous shunting demonstrated through the diffuse hepatic tumor vascularity.  Micro catheter was utilized to select the common hepatic artery selective common hepatic angiogram performed. This again demonstrates patency of the right and left hepatic arteries. Diffuse hepatic tumor vascularity demonstrated with early arterial portal venous shunting.  Micro catheter utilized to select the right hepatic artery. Selective right hepatic angiogram also  confirms arterial portal venous shunting with diffuse tumor vascularity.  Right hepatic artery Bland embolization: From this location, right hepatic artery bland embolization was performed with 1 vial of 700- 900 micron embospheres. This is in preparation for chemo embolization to decrease the arterial portal venous shunting prior to delivering drug-eluting beads. Following right hepatic artery bland embolization, there is significant reduction in the arterial portal venous shunting.  Next, the micro catheter was utilized to select the left hepatic artery. Selective left hepatic angiogram and bland embolization performed.  Left hepatic bland embolization: Left hepatic artery is widely patent with diffuse arterial tumor vascularity and portal venous shunting into the left portal vein. Bland embolization performed with 1 vial of 700 - 900 micron embospheres to decrease the  arterial portal venous shunting.  Micro catheter was utilized to re- select the right hepatic artery. Repeat right hepatic arteriogram confirms position with very little residual arterial portal venous shunting.  Drug-eluting beads trans arterial chemo embolization: From this location, 150 mg of doxorubicin drug-eluting beads were delivered predominately into the right hepatic artery. Minimal reflux into the left hepatic artery. Entire dose was able to be delivered. Post embolization angiographic confirms preserved patency of right hepatic artery with antegrade flow.  Minor catheter and C2 catheter were removed. Right common femoral artery access site was closed with a 5 [place] StarClose device. No immediate complication. Overall, patient tolerated the procedure well. She will be admitted for overnight observation.  IMPRESSION: Successful right and left hepatic artery bland embolizations with 700- 900 micron embospheres to decrease arterial portal venous shunting through the tumor vascularity.  Successful drug-eluting beads trans arterial chemo embolization predominantly to the right hepatic tumor vascularity, total dose administered 150 mg doxorubicin.   Electronically Signed   By: Daryll Brod M.D.   On: 11/25/2013 13:19   Ir Angiogram Selective Each Additional Vessel  11/28/2013   CLINICAL DATA:  58 year old female with HCV cirrhosis complicated by a hepatocellular carcinoma and bleeding gastric varices. The bleeding gastric varices were confirmed endoscopically and there was evidence of recent bleeding. She presents today for balloon occluded retrograde transvenous obliteration of her gastric varices.  EXAM: IR ULTRASOUND GUIDANCE VASC ACCESS RIGHT; ARTERIOGRAPHY; IR EMBO VENOUS NOT HEMORR HEMANG INC GUIDE ROADMAPPING; LEFT RENAL VENOGRAPHY; ADDITIONAL ARTERIOGRAPHY  Date: 11/28/2013  PROCEDURE: 1. Ultrasound-guided puncture of the right common femoral vein 2. Catheterization of the left renal vein with left  renal venogram 3. Catheterization of the gastric renal shunt with venogram 4. Balloon occluded retrograde venogram of the gastric varices. The primary inflow is via the Short and posterior gastric veins. 5. Balloon occluded retrograde transvenous obliteration of the gastric variceal network. 6. Coil embolization of the gastric renal shunt Interventional Radiologist: Criselda Peaches, MD and Corrie Mckusick, DO  ANESTHESIA/SEDATION: Moderate (conscious) sedation was used. Four mg Versed, 200 mcg Fentanyl were administered intravenously. The patient's vital signs were monitored continuously by radiology nursing throughout the procedure.  Sedation Time: 90 minutes  FLUOROSCOPY TIME:  12 min 54 seconds (526.5 mGy)  CONTRAST:  80 mL OMNIPAQUE IOHEXOL 300 MG/ML  SOLN  TECHNIQUE: Informed consent was obtained from the patient following explanation of the procedure, risks, benefits and alternatives. The patient understands, agrees and consents for the procedure. All questions were addressed. A time out was performed.  Maximal barrier sterile technique utilized including caps, mask, sterile gowns, sterile gloves, large sterile drape, hand hygiene, and Betadine skin prep. The right groin was interrogated with ultrasound. Common femoral vein is widely patent.  In images obtained and stored for the medical record. Local anesthesia was attained by infiltration with 1% lidocaine. A small dermatotomy was made. Under real-time sonographic guidance, a 21 gauge micropuncture needle was used to puncture the common femoral vein. With the assistance of a 4 [place] transitional micro sheath a 0.018 wire was exchanged for a 0.035 Bentson wire.  The skin tract was dilated to 10 [place] and a [place] 10 French TIPS sheath was advanced over the wire and positioned in the inferior vena cava. Using a C2 cobra catheter and Bentson wire of the left renal vein was catheterized. The Britta Mccreedy wire was exchanged for a [employer]. The tip sheath was advanced  over the 5 French catheter and into the left renal vein. A left renal venogram was performed. There is a circumaortic renal vein. 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  Using standard coaxial technique, and angled 5 French catheter and glidewire were used to select the gas to renal shunt. The catheter was advanced into the gastrorenal shunt and a venogram was performed confirming the anatomy of the gastric varices. A super stiff Amplatz wire was advanced into the gastrorenal shunt.  A [employer] 11 mm standard balloon occlusion catheter was then advanced into the gastric renal shunt followed by the TIPS sheath. Balloon occlusion was performed and a balloon occluded venogram was obtained.  All 3 inflow vessels (left gastric, posterior gastric and short gastric veins ) were identified. The primary inflow is via the short and posterior gastric veins. There are no escape vessels. Flow is static within the gastric varix. The balloon was deflated allowing the contrast material to washout. The foam sclerosing was then performed in standard fashion using a 3:2:1 ratio of air to 3% sodium tetradecyl sulfate to lipiodol. Balloon occlusion was again performed and obliteration of the gastric varices was conducted by injecting 12 mL of the sclerosant mixture. This represents a total of 4 mL of 3% STS. The end point was identified when sclerosant entered the short gastric vein.  After a short to well time, a rapid transit micro catheter was advanced coaxially through the occlusion balloon and into the proximal aspect of the distal gastric renal shunt. Coil embolization was then performed using a combination of 10 and 12 mm interlocked detachable micro coils. The occlusion balloon was then left inflated for a total of 1 hr. After that, it was slowly deflated under real-time fluoroscopic guidance. The sclerosis and mass remains stable. No evidence of a mobility or migration of the sclerosing. The  occlusion balloon was removed followed by the TIPS sheath. Hemostasis was attained by manual pressure. The patient tolerated the procedure well.  COMPLICATIONS: None immediate  IMPRESSION: Technically successful coil accelerated balloon occluded retrograde transvenous obliteration (CA-BRTO) of gastric varices.  PLAN: 1. Bed rest today. 2. Begin with clear liquid diet and advance diet as tolerated throughout the day. 3. CT scan in the morning to confirm successful occlusion of all gastric varices (scan is ordered). 4. If the patient is doing clinically well tomorrow and CT scan demonstrates successful occlusion of the gastric varices common discharge would be possible at the discretion of the admitting service. 5. Followup with Dr. Laurence Ferrari in [place] [facility] in 1 month. Signed,  Criselda Peaches, MD  [facility]  [facility]   Electronically Signed   By: Jacqulynn Cadet M.D.   On: 11/28/2013 11:42   Ir Angiogram Selective Each Additional Vessel  11/25/2013  CLINICAL DATA:  Multifocal diffuse hepatocellular carcinoma  EXAM: ULTRASOUND GUIDANCE FOR VASCULAR ACCESS  CELIAC ANGIOGRAM  COMMON HEPATIC, PROPER HEPATIC, RIGHT AND LEFT HEPATIC ANGIOGRAMS  RIGHT AND LEFT HEPATIC ARTERY BLAND EMBOLIZATIONS  RIGHT HEPATIC ARTERY DRUG-ELUTING BEADS TRANS ARTERIAL CHEMO EMBOLIZATION  Date:  11/3/201511/03/2013 11:52 am  Radiologist:  M. Daryll Brod, MD  Guidance:  Ultrasound and fluoroscopic  FLUOROSCOPY TIME:  12 min 36 seconds, 2,853 mGy  MEDICATIONS AND MEDICAL HISTORY: 4 mg versed, 200 mcg fentanyl, 20 mg Decadron, 4 mg Zofran, 3.375 g Zosyn administered within 1 hr of the procedure  ANESTHESIA/SEDATION: 1 hr 12 min  CONTRAST:  140 cc Omnipaque 619  COMPLICATIONS: None immediate  PROCEDURE: Informed consent was obtained from the patient following explanation of the procedure, risks, benefits and alternatives. The patient understands, agrees and consents for the procedure.  All questions were addressed. A time out was performed.  Maximal barrier sterile technique utilized including caps, mask, sterile gowns, sterile gloves, large sterile drape, hand hygiene, and Betadine.  Under sterile conditions and local anesthesia, ultrasound micropuncture access was performed of the right common femoral artery. Five French sheath inserted over a Bentson guidewire. C2 catheter utilized to select the celiac origin. Selective celiac angiogram performed.  Celiac angiogram: Celiac origin is widely patent. Hepatic, gastroduodenal, and splenic vasculature are patent. Arterial portal venous shunting demonstrated through the diffuse hepatic tumor vascularity.  Micro catheter was utilized to select the common hepatic artery selective common hepatic angiogram performed. This again demonstrates patency of the right and left hepatic arteries. Diffuse hepatic tumor vascularity demonstrated with early arterial portal venous shunting.  Micro catheter utilized to select the right hepatic artery. Selective right hepatic angiogram also confirms arterial portal venous shunting with diffuse tumor vascularity.  Right hepatic artery Bland embolization: From this location, right hepatic artery bland embolization was performed with 1 vial of 700- 900 micron embospheres. This is in preparation for chemo embolization to decrease the arterial portal venous shunting prior to delivering drug-eluting beads. Following right hepatic artery bland embolization, there is significant reduction in the arterial portal venous shunting.  Next, the micro catheter was utilized to select the left hepatic artery. Selective left hepatic angiogram and bland embolization performed.  Left hepatic bland embolization: Left hepatic artery is widely patent with diffuse arterial tumor vascularity and portal venous shunting into the left portal vein. Bland embolization performed with 1 vial of 700 - 900 micron embospheres to decrease the arterial  portal venous shunting.  Micro catheter was utilized to re- select the right hepatic artery. Repeat right hepatic arteriogram confirms position with very little residual arterial portal venous shunting.  Drug-eluting beads trans arterial chemo embolization: From this location, 150 mg of doxorubicin drug-eluting beads were delivered predominately into the right hepatic artery. Minimal reflux into the left hepatic artery. Entire dose was able to be delivered. Post embolization angiographic confirms preserved patency of right hepatic artery with antegrade flow.  Minor catheter and C2 catheter were removed. Right common femoral artery access site was closed with a 5 [place] StarClose device. No immediate complication. Overall, patient tolerated the procedure well. She will be admitted for overnight observation.  IMPRESSION: Successful right and left hepatic artery bland embolizations with 700- 900 micron embospheres to decrease arterial portal venous shunting through the tumor vascularity.  Successful drug-eluting beads trans arterial chemo embolization predominantly to the right hepatic tumor vascularity, total dose administered 150 mg doxorubicin.   Electronically Signed   By: Tamera Punt.D.  On: 11/25/2013 13:19   Ir US Guide Vasc Access Right  11/25/2013    IMPRESSION: Successful right and left hepatic artery bland embolizations with 700- 900 micron embospheres to decrease arterial portal venous shunting through the tumor vascularity.  Successful drug-eluting beads trans arterial chemo embolization predominantly to the right hepatic tumor vascularity, total dose administered 150 mg doxorubicin.   Electronically Signed   By: Daryll Brod M.D.   On: 11/25/2013 13:19   Ct Abd Wo & W Cm  11/30/18 IMPRESSION: 1. Successful obliteration of the gastric varices and coil embolization of gastro renal shunt. 2. Mild -moderate spill over of sclerosant into the portal splenic confluence, main portal vein and small  peripheral branches of the right portal vein throughout the periphery of the right liver. No evidence of thrombus formation or flow limitation in the main portal vein. The splenic vein remains patent. This degree of spillover is highly likely clinically insignificant and will be expected to resolve over the coming weeks. Re-assessment will be made in 1 month with repeat CT evaluation. 3. Similar appearance of the liver with advanced multicentric hepatocellular carcinoma throughout the right hepatic lobe. There appears to be some interval decrease in the amount of tumor enhancement compared to 10/28/2013. 4. Punctate nonobstructing right nephrolithiasis. 5. Gallbladder distention with mild gallbladder wall thickening. In the appropriate clinical setting, cholecystitis could have a similar appearance. 6. Additional ancillary findings as above. Signed,  Criselda Peaches, MD  [facility]  [facility]   Electronically Signed   By: Jacqulynn Cadet M.D.   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 She is now 1 day postop Status post balloon occluded retrograde transvenous obliteration of gastric varices.  EXAM: CT ABDOMEN WITHOUT AND WITH CONTRAST  TECHNIQUE: Multidetector CT imaging of the abdomen was performed following the standard protocol before and following the bolus administration of intravenous contrast.  CONTRAST:  100 mL Omnipaque 300 administered intravenously  COMPARISON:  Preoperative CT scan 10/28/2013; intraoperative images 11 06/2013  FINDINGS: Lower Chest: Dependent atelectasis in both lower lobes. Cardiomegaly with biatrial enlargement. Visualized distal thoracic esophagus is unremarkable.  Abdomen:  Similar appearance of the liver with hepatic cirrhosis and extensive infiltrative enhancing hepatocellular carcinoma throughout the right hemi  liver with regions of central necrosis and vascular shunting. Previously identified index lesions demonstrate slightly decreased enhancement compared to prior. Precise measurements are difficult given the relative heterogeneity and a amorphous contours. However, 1 of the index lesions in the posterior aspect of hepatic segment 6 measures slightly smaller at 4.5 x 4.2 cm compared to 5.3 x 4.6 cm. The largest lesion in hepatic segment 5 is similar in size but demonstrates decreased enhancement in areas.  And mild gallbladder wall thickening.  Cholelithiasis also present.  Stable cyst in the upper pole of the right kidney. No hydronephrosis or enhancing renal mass.a punctate nonobstructing nephrolithiasis in the interpolar right kidney.  Visualized bowel is within normal limits. No free fluid or free air.  Bones/Soft Tissues: No acute fracture or aggressive appearing lytic or blastic osseous lesion  Vascular: Successful obliteration of gastric varices. Radiopaque sclerosing is present with an the intragastric gastric varices as well as the gas to renal shunt. There is a radiopaque coil pack at the superior aspect of the gas to renal shunt. Radiopaque sclerosis and extends into the short gastric vein and upper splenic vein. The splenic  vein remains patent and opacified with contrast material. There is radiopaque sclerosis and layering and see dependently along the anterior wall of the portal splenic confluence and main portal vein. Again, no portal venous thrombus. The main portal vein remains patent. Small amounts of scattered radiopaque sclerosing throughout small branch vessels in the right portal vein. The left renal vein remains widely patent. Circumaortic left renal vein again noted. Dg Chest Port 1 View  11/26/2013   CLINICAL DATA:  Initial encounter for hypoxia after hepatic artery embolization yesterday.  EXAM: PORTABLE CHEST - 1 VIEW  COMPARISON:  01/16/2013.  FINDINGS: Seventeen. Lung volumes are low. There  is vascular congestion with patchy bilateral interstitial and airspace opacity, left greater than right. Possible tiny left pleural effusion. Cardiopericardial silhouette is enlarged. Imaged bony structures of the thorax are intact.  IMPRESSION: Vascular congestion with patchy airspace disease, left greater than right. Asymmetric edema or diffuse infection would be a consideration.  Enlargement of the cardiopericardial silhouette.   Electronically Signed   By: Misty Stanley M.D.   On: 11/26/2013 17:25   Ir Embo Venous Not [place]  11/28/2013  IMPRESSION: Technically successful coil accelerated balloon occluded retrograde transvenous obliteration (CA-BRTO) of gastric varices.  PLAN: 1. Bed rest today. 2. Begin with clear liquid diet and advance diet as tolerated throughout the day. 3. CT scan in the morning to confirm successful occlusion of all gastric varices (scan is ordered). 4. If the patient is doing clinically well tomorrow and CT scan demonstrates successful occlusion of the gastric varices common discharge would be possible at the discretion of the admitting service. 5. Followup with Dr. Laurence Ferrari in [place] clinic in 1 month. Signed,  Criselda Peaches, MD  [facility]  [facility]   Electronically Signed   By: Jacqulynn Cadet M.D.   On: 11/28/2013 11:42   Ir Embo Tumor Organ Ischemia Infarct Inc Guide Roadmapping  11/25/2013     IMPRESSION: Successful right and left hepatic artery bland embolizations with 700- 900 micron embospheres to decrease arterial portal venous shunting through the tumor vascularity.  Successful drug-eluting beads trans arterial chemo embolization predominantly to the right hepatic tumor vascularity, total dose administered 150 mg doxorubicin.   Electronically Signed   By: Daryll Brod M.D.   On: 11/25/2013 13:19    Microbiology: Recent Results (from the past 240 hour(s))  Culture, blood (routine  x 2)     Status: None   Collection Time: 11/26/13  6:46 PM  Result Value Ref Range Status   Specimen Description BLOOD RIGHT ARM  Final   Special Requests BOTTLES DRAWN AEROBIC AND ANAEROBIC 10CC  Final   Culture  Setup Time   Final    11/26/2013 22:24 Performed at [place]   Final    NO GROWTH 5 DAYS Performed at [facility]    Report Status 12/02/2013 FINAL  Final  Culture, blood (routine x 2)     Status: None   Collection Time: 11/26/13  6:53 PM  Result Value Ref Range Status   Specimen Description BLOOD RIGHT HAND  Final   Special Requests BOTTLES DRAWN AEROBIC AND ANAEROBIC 10CC  Final   Culture  Setup Time   Final    11/26/2013 22:24 Performed at [place]   Final    NO GROWTH 5 DAYS Performed at [facility]    Report Status 12/02/2013 FINAL  Final     Labs: Basic Metabolic Panel:  Recent Labs Lab 11/26/13 0850 11/27/13 0430 11/28/13 0426 11/29/13 0334 11/30/13 0430  NA 135* 137 139 135* 135*  K 5.7* 3.5* 3.6* 4.1 3.8  CL 99 101 102 100 102  CO2 21 24 22 22 22   GLUCOSE 108* 76 86 106* 114*  BUN 12 11 7 14 13   CREATININE 0.53 0.59 0.48* 0.58 0.50  CALCIUM 8.9 8.2* 7.7* 7.7* 7.7*   Liver Function Tests:  Recent Labs Lab 11/27/13 0430 11/28/13 0426 11/29/13 0334 11/30/13 0430 12/02/13 0601  AST 221* 155* 112* 87* 81*  ALT 112* 87* 61* 48* 36*  ALKPHOS 160* 152* 146* 146* 159*  BILITOT 1.4* 1.4* 1.3* 1.0 1.4*  PROT 6.8 6.2 6.0 5.9* 6.1  ALBUMIN 2.6* 2.4* 2.1* 2.2* 2.3*   CBC:  Recent Labs Lab 11/28/13 0426 11/29/13 0334 11/30/13 0430 12/01/13 0530 12/02/13 0601  WBC 7.3 7.6 7.1 6.7 7.2  HGB 10.0* 9.6* 9.0* 8.7* 9.6*  HCT 30.3* 29.3* 27.1* 26.0* 28.9*  MCV 88.9 89.6 89.4 89.3 89.2  PLT 205 179 179 152 120*   Cardiac Enzymes:  Recent Labs Lab 11/30/13 0430 11/30/13 0918 11/30/13 1556  TROPONINI <0.30 <0.30 <0.30   CBG:  Recent Labs Lab 12/01/13 1200  12/01/13 1805 12/02/13 0002 12/02/13 0616 12/02/13 1158  GLUCAP 129* 131* 164* 77 167*       Signed:  Melton Alar, PA-C  [facility] 12/02/2013, 3:00 PM

## 2013-12-02 NOTE — Progress Notes (Signed)
Nsg Discharge Note  Admit Date:  11/25/2013 Discharge date: 12/02/2013   Contina Ragle to be D/C'd Home per MD order.  AVS completed.  Copy for chart, and copy for patient signed, and dated. Patient/caregiver able to verbalize understanding.  Discharge Medication:   Medication List    STOP taking these medications        hydrochlorothiazide 12.5 MG capsule  Commonly known as:  MICROZIDE     lisinopril 20 MG tablet  Commonly known as:  PRINIVIL,ZESTRIL      TAKE these medications        albuterol (2.5 MG/3ML) 0.083% nebulizer solution  Commonly known as:  PROVENTIL  Take 3 mLs (2.5 mg total) by nebulization every 6 (six) hours as needed for wheezing or shortness of breath.     amLODipine 10 MG tablet  Commonly known as:  NORVASC  Take 10 mg by mouth every morning.     carvedilol 12.5 MG tablet  Commonly known as:  COREG  Take 6.25 mg by mouth 2 (two) times daily with a meal.     HYDROcodone-acetaminophen 5-325 MG per tablet  Commonly known as:  NORCO/VICODIN  Take 1-2 tablets by mouth every 4 (four) hours as needed for moderate pain.     lactose free nutrition Liqd  Take 237 mLs by mouth 3 (three) times daily with meals.     levofloxacin 500 MG tablet  Commonly known as:  LEVAQUIN  Take 1 tablet (500 mg total) by mouth daily.     megestrol 625 MG/5ML suspension  Commonly known as:  MEGACE ES  Take 5 mLs (625 mg total) by mouth daily.     pantoprazole 40 MG tablet  Commonly known as:  PROTONIX  Take 1 tablet (40 mg total) by mouth 2 (two) times daily.     promethazine 25 MG tablet  Commonly known as:  PHENERGAN  Take 25 mg by mouth every 6 (six) hours as needed for nausea.        Discharge Assessment: Filed Vitals:   12/02/13 1713  BP: 105/55  Pulse: 66  Temp:   Resp:    Skin clean, dry and intact without evidence of skin break down, no evidence of skin tears noted. IV catheter discontinued intact. Site without signs and symptoms of complications - no  redness or edema noted at insertion site, patient denies c/o pain - only slight tenderness at site.  Dressing with slight pressure applied.  D/c Instructions-Education: Discharge instructions given to patient/family with verbalized understanding. D/c education completed with patient/family including follow up instructions, medication list, d/c activities limitations if indicated, with other d/c instructions as indicated by MD - patient able to verbalize understanding, all questions fully answered. Patient instructed to return to ED, call 911, or call MD for any changes in condition.  Patient escorted via [place], and D/C home via private auto.  Kaelen Brennan Margaretha Sheffield, RN 12/02/2013 5:16 PM

## 2013-12-02 NOTE — Progress Notes (Signed)
SATURATION QUALIFICATIONS: (This note is used to comply with regulatory documentation for home oxygen)  Patient Saturations on Room Air at Rest = 87%  Patient Saturations on Room Air while Ambulating = 87-90%  Patient Saturations on 1 Liters of oxygen while Ambulating = 94%  Please briefly explain why patient needs home oxygen:

## 2013-12-02 NOTE — Progress Notes (Signed)
OT Cancellation Note  Patient Details Name: Whitney Patel MRN: [id number] DOB: 12-25-1955   Cancelled Treatment:    Reason Eval/Treat Not Completed: Other (comment). OT spoke with pt for approximatley 4 minutes about energy conservation. Pt refusing to work with therapist.   Benito Mccreedy OTR/L [phone] 12/02/2013, 12:40 PM

## 2013-12-02 NOTE — Progress Notes (Signed)
Pt refuses neb tx

## 2013-12-02 NOTE — Care Management Note (Signed)
    Page 1 of 2   12/02/2013     4:29:37 PM CARE MANAGEMENT NOTE 12/02/2013  Patient:  Whitney Patel, Whitney Patel   Account Number:  [id number]  Date Initiated:  11/27/2013  Documentation initiated by:  Marney Doctor  Subjective/Objective Assessment:   58 yo admitted with Hepatocellular carcinoma with Hematemesis.  HX of HTN and DM, Hep C with cirrhosis.     Action/Plan:   From home with children   Anticipated DC Date:  12/02/2013   Anticipated DC Plan:  [place]  CM consult  Other      [facility] Choice  HOME HEALTH   Choice offered to / List presented to:  C-1 Patient   DME arranged  [facility]  NEBULIZER MACHINE      DME agency  [place] arranged  HH-1 RN      [place].   Status of service:  Completed, signed off Medicare Important Message given?  NO (If response is "NO", the following Medicare IM given date fields will be blank) Date Medicare IM given:   Medicare IM given by:   Date Additional Medicare IM given:   Additional Medicare IM given by:    Discharge Disposition:  HOME/SELF CARE  Per UR Regulation:  Reviewed for med. necessity/level of care/duration of stay  If discussed at [place] of Stay Meetings, dates discussed:    Comments:  12/02/13 [place], BSN [phone] patient is for dc today she [facility] for [facility], referral made to [facility], [place] notified.  Soc will begin 24-48 hrs post dc.  Jermaine wit [facility] brought oxygen and neb machine up to room.  12/01/2013 1500 NCM spoke to pt and states she was working at [employer] in [place]. States she believes she will not be able to return to work due to harsh chemical she uses each day which aggravates her condition. Her short term disability will not start until Jan 23, 2014. She has only been working for 3 weeks at [employer]. Provided pt with info brochure on how to apply for Social Security Disability. Pt  states she will review and follow up after she is dc from hospital. She does have a PCP that she saw last month. NCM explained the importance of follow up post dc to qualify for SSDI. Waiting final recommendation for home. Pt may need oxygen for home. Jonnie Finner RN CCM Case Mgmt phone [phone]  11/27/13 Marney Doctor RN,BSN,NCM [phone] Chart reviewed and CM following for DC needs.

## 2013-12-02 NOTE — Clinical Social Work Note (Signed)
Per PA's request, CSW met with patient regarding disability. Patient appeared very reserved and short in her responses to CSW. Information on how to apply for disability was provided for patient. Patient states her husband will be transporting her home. No other CSW needs noted. CSW signing off at this time.   Liz Beach MSW, [place], [place], [phone]

## 2013-12-10 ENCOUNTER — Other Ambulatory Visit: Payer: Self-pay | Admitting: Family Medicine

## 2013-12-10 MED ORDER — PROMETHAZINE HCL 25 MG PO TABS: 25.0000 mg | ORAL_TABLET | Freq: Four times a day (QID) | ORAL | Status: AC | PRN

## 2013-12-10 NOTE — Telephone Encounter (Signed)
Pt needs refill on phenergan, pt goes to [facility] dr

## 2013-12-10 NOTE — Telephone Encounter (Signed)
Refill placed. I would like to see this patient soon. She and I have not been able to discuss her condition.  Thank you!

## 2013-12-10 NOTE — Telephone Encounter (Signed)
Pt is aware of medication refill.  Appt 12/12/2013 at 2 PM.  Derl Barrow, RN

## 2013-12-12 ENCOUNTER — Ambulatory Visit: Payer: No Typology Code available for payment source | Admitting: Family Medicine

## 2013-12-12 ENCOUNTER — Telehealth: Payer: Self-pay | Admitting: Family Medicine

## 2013-12-12 NOTE — Telephone Encounter (Signed)
Unfortunately this is a common side effect of chemotherapy. I do not believe that there is any shampoo which can prevent this. The pathophysiology of this symptom involves the fact that chemotherapy actively targets the cells which are in constant (and in cancers case uncontrolled) cell division. There are normal cells in the body which are in constant cell division. Hair follicles are some of those normal cells as are cells of the inner lining of our digestive system. This is why hair loss and chronic diarrhea/vomiting/nausea can be some of the more typical side effects of chemotherapy.  Can you please relay this to the patient.  I am disappointed that she felt the need to cancel her appointment with me on Friday (today). I believe that I made be able to offer additional support while she goes through some of these significant procedures if we were able to develop a professional relationship while face-to-face here in the clinic.

## 2013-12-12 NOTE — Telephone Encounter (Signed)
[place] aide called and wanted the doctor to know that the patient has been going to chemo and now her hair is falling out in big chunks. The aide said that there is something that can be called in to help with this. She thinks that this is a shampoo but not sure. Sfe would like the doctor to call this is in to the patients pharmacy. jw

## 2013-12-16 ENCOUNTER — Telehealth: Payer: Self-pay | Admitting: Family Medicine

## 2013-12-16 NOTE — Telephone Encounter (Signed)
[place] aide called and the patient needs a glucometer and all the supplies that go with it so that she can test her blood sugar levels. She has [employer] so please make sure the meter that is called in is covered by her insurance. Please use the [facility] on [address]. jw

## 2013-12-23 ENCOUNTER — Other Ambulatory Visit (HOSPITAL_COMMUNITY): Payer: Self-pay | Admitting: Radiology

## 2013-12-23 ENCOUNTER — Other Ambulatory Visit: Payer: Self-pay | Admitting: Radiology

## 2013-12-23 ENCOUNTER — Other Ambulatory Visit (HOSPITAL_COMMUNITY): Payer: Self-pay | Admitting: Interventional Radiology

## 2013-12-23 DIAGNOSIS — I10 Essential (primary) hypertension: Secondary | ICD-10-CM

## 2013-12-23 DIAGNOSIS — R0902 Hypoxemia: Secondary | ICD-10-CM

## 2013-12-23 DIAGNOSIS — C220 Liver cell carcinoma: Secondary | ICD-10-CM

## 2013-12-23 DIAGNOSIS — K7030 Alcoholic cirrhosis of liver without ascites: Secondary | ICD-10-CM

## 2013-12-23 DIAGNOSIS — I864 Gastric varices: Secondary | ICD-10-CM

## 2013-12-23 DIAGNOSIS — E119 Type 2 diabetes mellitus without complications: Secondary | ICD-10-CM

## 2013-12-23 DIAGNOSIS — T148XXA Other injury of unspecified body region, initial encounter: Secondary | ICD-10-CM

## 2013-12-23 DIAGNOSIS — R42 Dizziness and giddiness: Secondary | ICD-10-CM

## 2013-12-23 DIAGNOSIS — B182 Chronic viral hepatitis C: Secondary | ICD-10-CM

## 2013-12-23 DIAGNOSIS — Z720 Tobacco use: Secondary | ICD-10-CM

## 2013-12-23 DIAGNOSIS — R0602 Shortness of breath: Secondary | ICD-10-CM

## 2013-12-23 DIAGNOSIS — K769 Liver disease, unspecified: Secondary | ICD-10-CM

## 2013-12-23 DIAGNOSIS — F1911 Other psychoactive substance abuse, in remission: Secondary | ICD-10-CM

## 2013-12-23 DIAGNOSIS — K920 Hematemesis: Secondary | ICD-10-CM

## 2013-12-23 NOTE — Telephone Encounter (Signed)
Number provided not in service, patient has appointment on 12/8 can discuss getting supplies then.

## 2013-12-23 NOTE — Telephone Encounter (Signed)
Please inform patient (and aid) that I need to actually see patient in office first.   I cannot fill requests w/o a purpose.  Thanks!

## 2013-12-25 ENCOUNTER — Telehealth: Payer: Self-pay | Admitting: Family Medicine

## 2013-12-25 NOTE — Telephone Encounter (Signed)
Shelia the home health nurse called and needing to extend verbal orders for home care for pt. They would like at once a week for 5 weeks. Please call Shelia at [phone] jw

## 2013-12-26 NOTE — Telephone Encounter (Signed)
I called Shelia. She stated that patient's home health care order had run out and that patient was in need of some assistance at home. I discussed with her the pros and cons of extending this period. I seem that there is a finite amount of time in which this patient can take advantage of home health nurse care. I do not want this time to run out. With that said I also do not want this patient to have any additional difficulties which may be avoidable and beneficial for patient's well-being. I have placed a verbal order with Adela Lank to extend her home health care for an additional 3 weeks.  I have a scheduled appointment with patient on 12/8 and will reassess patient's condition at that time.

## 2013-12-30 ENCOUNTER — Encounter: Payer: Self-pay | Admitting: Family Medicine

## 2013-12-30 ENCOUNTER — Ambulatory Visit (INDEPENDENT_AMBULATORY_CARE_PROVIDER_SITE_OTHER): Payer: No Typology Code available for payment source | Admitting: Family Medicine

## 2013-12-30 VITALS — BP 129/77 | HR 77 | Temp 98.4°F | Ht 65.0 in | Wt 125.5 lb

## 2013-12-30 DIAGNOSIS — C220 Liver cell carcinoma: Secondary | ICD-10-CM

## 2013-12-30 MED ORDER — MEGESTROL ACETATE 625 MG/5ML PO SUSP: 625.0000 mg | Freq: Every day | ORAL | Status: AC

## 2013-12-30 NOTE — Progress Notes (Signed)
   HPI  Patient presents today for hospital follow-up. Patient seen after stent in the hospital for treatment for her cellular carcinoma as well as treatment for pneumonia. Patient states she has been doing much better recently. She does not report any significant changes since her discharge. No fevers chills nausea vomiting. 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 Has had no issues with breathing recently.  Smoking status noted ROS: Per HPI  Objective: BP 129/77 mmHg  Pulse 77  Temp(Src) 98.4 F (36.9 C) (Oral)  Ht 5\' 5"  (1.651 m)  Wt 125 lb 8 oz (56.926 kg)  BMI 20.88 kg/m2 Gen: NAD, alert, cooperative with exam HEENT: NCAT, EOMI, PERRL, temporalis wasting present,  CV: RRR, good O3/Z8, holosystolic murmur greatest at sternal border Resp: CTABL, no wheezes, non-labored Abd: SNTND, BS present, no guarding or organomegaly Ext: No edema, warm Neuro: Alert and oriented, No gross deficits  Assessment and plan:  HCC (hepatocellular carcinoma) Patient has been receiving chemotherapy trials at this time. She is currently being followed by oncology. Today she reports no new issues. Alopecia has begun to set in. Patient is fairly distraught about this and has many questions about the length of time she must wait before it reverses. I was able to answers many questions as I accurately could.  Patient reported that she was running low on her Megace medication. This is been prescribed by her oncologist, I normally would not feel comfortable refilling this medication however considering her current nutritional state which borders on cachexia I deemed it appropriate to refill this time.  CMP, CBC, INR obtained this visit.    Orders Placed This Encounter  Procedures  . COMPLETE METABOLIC PANEL WITH GFR  . CBC  . Protime-INR    Meds ordered this encounter  Medications  . megestrol (MEGACE ES) 625 MG/5ML suspension    Sig: Take 5 mLs (625 mg total) by mouth daily.   Dispense:  150 mL    Refill:  2     Elberta Leatherwood, MD,MS,  PGY1 12/30/2013 5:13 PM

## 2013-12-30 NOTE — Assessment & Plan Note (Signed)
Patient has been receiving chemotherapy trials at this time. She is currently being followed by oncology. Today she reports no new issues. Alopecia has begun to set in. Patient is fairly distraught about this and has many questions about the length of time she must wait before it reverses. I was able to answers many questions as I accurately could.  Patient reported that she was running low on her Megace medication. This is been prescribed by her oncologist, I normally would not feel comfortable refilling this medication however considering her current nutritional state which borders on cachexia I deemed it appropriate to refill this time.  CMP, CBC, INR obtained this visit.

## 2013-12-30 NOTE — Patient Instructions (Signed)
It was a pleasure seeing you today in our clinic. Today we discussed you cancer treatments. Here is the treatment plan we have discussed and agreed upon together:   - I refilled your Megace. - As of now your prescription seemed to be up-to-date on refills. - I have set up to have your blood drawn today. - Please do not hesitate to contact me if you have any questions in the future.

## 2013-12-31 LAB — COMPLETE METABOLIC PANEL WITH GFR
ALK PHOS: 303 U/L — AB (ref 39–117)
ALT: 24 U/L (ref 0–35)
AST: 61 U/L — ABNORMAL HIGH (ref 0–37)
Albumin: 3.1 g/dL — ABNORMAL LOW (ref 3.5–5.2)
BUN: 7 mg/dL (ref 6–23)
CO2: 27 mEq/L (ref 19–32)
CREATININE: 0.48 mg/dL — AB (ref 0.50–1.10)
Calcium: 8.8 mg/dL (ref 8.4–10.5)
Chloride: 101 mEq/L (ref 96–112)
GFR, Est African American: >89 mL/min
GFR, Est Non African American: >89 mL/min
GLUCOSE: 124 mg/dL — AB (ref 70–99)
Potassium: 3.7 mEq/L (ref 3.5–5.3)
SODIUM: 137 meq/L (ref 135–145)
TOTAL PROTEIN: 7.6 g/dL (ref 6.0–8.3)
Total Bilirubin: 1.9 mg/dL — ABNORMAL HIGH (ref 0.2–1.2)

## 2013-12-31 LAB — PROTIME-INR
INR: 1.21 (ref <1.50)
Prothrombin Time: 15.3 seconds — ABNORMAL HIGH (ref 11.6–15.2)

## 2013-12-31 LAB — CBC
HEMATOCRIT: 31.5 % — AB (ref 36.0–46.0)
Hemoglobin: 10.6 g/dL — ABNORMAL LOW (ref 12.0–15.0)
MCH: 29.9 pg (ref 26.0–34.0)
MCHC: 33.7 g/dL (ref 30.0–36.0)
MCV: 89 fL (ref 78.0–100.0)
MPV: 9.8 fL (ref 9.4–12.4)
Platelets: 383 10*3/uL (ref 150–400)
RBC: 3.54 MIL/uL — ABNORMAL LOW (ref 3.87–5.11)
RDW: 17.3 % — AB (ref 11.5–15.5)
WBC: 9.6 10*3/uL (ref 4.0–10.5)

## 2014-01-08 ENCOUNTER — Ambulatory Visit (HOSPITAL_COMMUNITY)
Admission: RE | Admit: 2014-01-08 | Discharge: 2014-01-08 | Disposition: A | Discharge status: Home / Self Care | Payer: No Typology Code available for payment source | Source: Ambulatory Visit | Attending: Interventional Radiology | Admitting: Interventional Radiology

## 2014-01-08 ENCOUNTER — Other Ambulatory Visit: Payer: Self-pay | Admitting: Radiology

## 2014-01-08 ENCOUNTER — Encounter (HOSPITAL_COMMUNITY): Payer: Self-pay

## 2014-01-08 ENCOUNTER — Ambulatory Visit
Admission: RE | Admit: 2014-01-08 | Discharge: 2014-01-08 | Disposition: A | Discharge status: Home / Self Care | Payer: No Typology Code available for payment source | Source: Ambulatory Visit | Attending: Radiology | Admitting: Radiology

## 2014-01-08 DIAGNOSIS — R42 Dizziness and giddiness: Secondary | ICD-10-CM

## 2014-01-08 DIAGNOSIS — R0602 Shortness of breath: Secondary | ICD-10-CM

## 2014-01-08 DIAGNOSIS — T148XXA Other injury of unspecified body region, initial encounter: Secondary | ICD-10-CM

## 2014-01-08 DIAGNOSIS — Z720 Tobacco use: Secondary | ICD-10-CM

## 2014-01-08 DIAGNOSIS — B182 Chronic viral hepatitis C: Secondary | ICD-10-CM

## 2014-01-08 DIAGNOSIS — K7030 Alcoholic cirrhosis of liver without ascites: Secondary | ICD-10-CM

## 2014-01-08 DIAGNOSIS — F1911 Other psychoactive substance abuse, in remission: Secondary | ICD-10-CM

## 2014-01-08 DIAGNOSIS — K769 Liver disease, unspecified: Secondary | ICD-10-CM

## 2014-01-08 DIAGNOSIS — R0902 Hypoxemia: Secondary | ICD-10-CM

## 2014-01-08 DIAGNOSIS — I10 Essential (primary) hypertension: Secondary | ICD-10-CM

## 2014-01-08 DIAGNOSIS — K920 Hematemesis: Secondary | ICD-10-CM

## 2014-01-08 DIAGNOSIS — K7581 Nonalcoholic steatohepatitis (NASH): Secondary | ICD-10-CM

## 2014-01-08 DIAGNOSIS — E119 Type 2 diabetes mellitus without complications: Secondary | ICD-10-CM

## 2014-01-08 DIAGNOSIS — I864 Gastric varices: Secondary | ICD-10-CM

## 2014-01-08 DIAGNOSIS — C220 Liver cell carcinoma: Secondary | ICD-10-CM

## 2014-01-08 DIAGNOSIS — D638 Anemia in other chronic diseases classified elsewhere: Secondary | ICD-10-CM

## 2014-01-08 MED ORDER — IOHEXOL 300 MG/ML  SOLN
100.0000 mL | Freq: Once | INTRAMUSCULAR | Status: AC | PRN
  Administered 2014-01-08: 100 mL via INTRAVENOUS

## 2014-01-08 NOTE — Progress Notes (Signed)
Chief Complaint: Chief Complaint  Patient presents with  . Follow-up    1 mo follow up BRTO    Referring Physician(s): Turpin,Pamela A  History of Present Illness: Whitney Patel is a 58 y.o. 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 She recently underwent a DEB-TACE procedure performed by Dr. Annamaria Boots for her multifocal hepatocellular cancer. Her postoperative course was complicated by large volume upper GI bleeding. Endoscopy performed by Dr. Juanita Craver demonstrated gastric varices. She then underwent balloon occluded retrograde transvenous obliteration of her gastric varices which was successful. Following this procedure, her upper GI bleeding has not recurred. She has continued to require oxygen therapy.  She was working until her most recent hospitalization. She has now successfully completed the application process and is on disability. Today, she is fully dressed and very well kempt.  She denies any episodes of bleeding per rectum, hematemesis or vomiting of coffee ground emesis. She denies abdominal pain. She reports only continued mild fatigue which prevents her from working but does not prevent her from completing her activities of daily living. Her appetite is fair when questioned.  She denies fever, increasing abdominal girth or weight gain.  Past Medical History  Diagnosis Date  . Hypertension   . Diabetes mellitus   . Hepatitis C   . Hepatocellular carcinoma     Past Surgical History  Procedure Laterality Date  . Cesarean section  1981  . Hepatic artery embolization      08/2013 and again 11/26/2013  . Esophagogastroduodenoscopy N/A 11/26/2013    Procedure: ESOPHAGOGASTRODUODENOSCOPY (EGD);  Surgeon: Juanita Craver, MD;  Location: [facility];  Service: Endoscopy;  Laterality: N/A;    Allergies: Review of patient's allergies indicates no known allergies.  Medications: Prior to Admission medications     Medication Sig Start Date End Date Taking? Authorizing Provider  albuterol (PROVENTIL) (2.5 MG/3ML) 0.083% nebulizer solution Take 3 mLs (2.5 mg total) by nebulization every 6 (six) hours as needed for wheezing or shortness of breath. 12/02/13   Bobby Rumpf York, PA-C  amLODipine (NORVASC) 10 MG tablet Take 10 mg by mouth every morning.    Historical Provider, MD  carvedilol (COREG) 12.5 MG tablet Take 6.25 mg by mouth 2 (two) times daily with a meal.    Historical Provider, MD  HYDROcodone-acetaminophen (NORCO/VICODIN) 5-325 MG per tablet Take 1-2 tablets by mouth every 4 (four) hours as needed for moderate pain. 12/02/13   Bobby Rumpf York, PA-C  lactose free nutrition (BOOST PLUS) LIQD Take 237 mLs by mouth 3 (three) times daily with meals. 12/02/13   Melton Alar, PA-C  megestrol (MEGACE ES) 625 MG/5ML suspension Take 5 mLs (625 mg total) by mouth daily. 12/30/13   Elberta Leatherwood, MD  pantoprazole (PROTONIX) 40 MG tablet Take 1 tablet (40 mg total) by mouth 2 (two) times daily. 12/02/13   Bobby Rumpf York, PA-C  promethazine (PHENERGAN) 25 MG tablet Take 1 tablet (25 mg total) by mouth every 6 (six) hours as needed for nausea. 12/10/13   Elberta Leatherwood, MD    Family History  Problem Relation Age of Onset  . High blood pressure Neg Hx   . Diabetes Neg Hx   . Cancer Neg Hx     History   Social History  . Marital Status: Legally Separated    Spouse Name: N/A    Number of Children: N/A  . Years of Education: N/A   Social History Main Topics  .  Smoking status: Current Some Day Smoker -- 0.25 packs/day for 30 years    Types: Cigarettes    Start date: 10/21/1971  . Smokeless tobacco: Never Used     Comment: 1 pack per week  . Alcohol Use: No     Comment: occasion  . Drug Use: No  . Sexual Activity: No   Other Topics Concern  . Not on file   Social History Narrative   Lives alone at home.  No assist devices or home health services.      ECOG Status: 1 - Symptomatic but  completely ambulatory  Review of Systems: A 12 point ROS discussed and pertinent positives are indicated in the HPI above.  All other systems are negative.  Review of Systems  Vital Signs: BP 121/73 mmHg  Pulse 78  Temp(Src) 98.9 F (37.2 C)  Resp 12  SpO2 94%  Physical Exam  Constitutional: She is oriented to person, place, and time. She appears well-developed and well-nourished.  HENT:  Head: Normocephalic and atraumatic.  Eyes: No scleral icterus.  Cardiovascular: Normal rate and regular rhythm.   Pulmonary/Chest: Effort normal.  Abdominal: Soft. There is no tenderness.  Neurological: She is alert and oriented to person, place, and time.  Skin: Skin is warm and dry.  Psychiatric: She has a normal mood and affect. Her behavior is normal.  Nursing note and vitals reviewed.   Imaging: Ct Abd Wo & W Cm  01/08/2014   CLINICAL DATA:  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 Subsequent encounter.  EXAM: CT ABDOMEN WITHOUT AND WITH CONTRAST  TECHNIQUE: Multidetector CT imaging of the abdomen was performed following the standard protocol before and following the bolus administration of intravenous contrast.  CONTRAST:  146mL OMNIPAQUE IOHEXOL 300 MG/ML  SOLN  COMPARISON:  Prior CTs dated 10/28/2013 and 11/29/2013.  FINDINGS: Lower chest: There is increased atelectasis at the right lung base. There are several small nodules at the right lung base which were not previously change are indeterminate in etiology. There is no pleural effusion. The heart is mildly enlarged.  Hepatobiliary: Again demonstrated are widespread changes of advanced cirrhosis with hepatomegaly, diffuse contour irregularity and extreme hypervascular nodularity following contrast. Multiple areas vascular shunting are again demonstrated. The liver measures at least 26.6 cm in length, similar to prior study. The individual foci of the multifocal hepatocellular carcinoma remain difficult to  measure given the heterogeneous enhancement and irregular margins. However, continued slight improvement is suggested. Index lesions include 1 in segment 6 measuring 4.6 x 4.3 cm (image 42-previously 4.5 x 4.2 cm). The largest lesion in segment 5 measures approximately 4.9 x 4.4 cm on image 46 (previously 5.3 x 5.4 cm. The gallbladder appears stable with small stones and nodular enhancement within its wall. There is no biliary dilatation.  Pancreas: Unremarkable. No pancreatic ductal dilatation or surrounding inflammatory changes.  Spleen: Normal in size without focal abnormality.  Adrenals/Urinary Tract: Both adrenal glands appear normal.There is a simple cyst in the upper pole of the right kidney which appears stable. No hydronephrosis or suspicious renal lesions demonstrated.  Stomach/Bowel: The stomach is incompletely distended. There is stable mild generalized bowel wall thickening attributed to liver disease. No evidence of bowel obstruction.  Vascular/Lymphatic: Prominent lymph nodes within the porta hepatis are grossly stable. There is stable mild aortoiliac atherosclerosis. There is significant portosystemic shunting. The portal vein is patent without evidence of thrombus. Radiopaque sclerosing agent is noted at the splenic hilum status post obliteration of gastric varices.  Sclerosing agent within the portal vein has decreased in volume.  Other: No evidence of abdominal wall mass or hernia.  Musculoskeletal: No acute or significant osseous findings. Stable convex left lumbar scoliosis.  IMPRESSION: 1. Interval partial resolution of spillover of sclerosis into the portal splenic confluence. No evidence of portal vein thrombosis. 2. Minimal improvement in widespread hepatocellular carcinoma with marked enlargement of the liver and widespread vascular shunting. 3. Stable incidental findings.  No acute findings demonstrated.   Electronically Signed   By: Camie Patience M.D.   On: 01/08/2014 08:55     Labs:  CBC:  Recent Labs  11/30/13 0430 12/01/13 0530 12/02/13 0601 12/30/13 1627  WBC 7.1 6.7 7.2 9.6  HGB 9.0* 8.7* 9.6* 10.6*  HCT 27.1* 26.0* 28.9* 31.5*  PLT 179 152 120* 383    COAGS:  Recent Labs  08/25/13 1111 09/16/13 0721 11/25/13 0800 11/29/13 0334 12/30/13 1627  INR 0.97 1.06 1.12 1.28 1.21  APTT 29 30 24   --   --     BMP:  Recent Labs  11/28/13 0426 11/29/13 0334 11/30/13 0430 12/30/13 1627  NA 139 135* 135* 137  K 3.6* 4.1 3.8 3.7  CL 102 100 102 101  CO2 22 22 22 27   GLUCOSE 86 106* 114* 124*  BUN 7 14 13 7   CALCIUM 7.7* 7.7* 7.7* 8.8  CREATININE 0.48* 0.58 0.50 0.48*  GFRNONAA >90 >90 >90 >89  GFRAA >90 >90 >90 >89    LIVER FUNCTION TESTS:  Recent Labs  11/29/13 0334 11/30/13 0430 12/02/13 0601 12/30/13 1627  BILITOT 1.3* 1.0 1.4* 1.9*  AST 112* 87* 81* 61*  ALT 61* 48* 36* 24  ALKPHOS 146* 146* 159* 303*  PROT 6.0 5.9* 6.1 7.6  ALBUMIN 2.1* 2.2* 2.3* 3.1*    TUMOR MARKERS:  Recent Labs  07/24/13 1318  AFPTM 9.2*    Assessment and Plan:  Whitney Patel is doing quite well 1 month status post BRTO for bleeding gastric varices.  The varices appear completely obliterated on her follow-up CT scan today. Additionally, the small amount of spillover of sclerosis and into the main portal vein is resolving and there is no evidence of portal vein thrombosis.  1.) She should undergo screening endoscopy at approximately 8-12 weeks following BRTO to assess for new or progressive esophageal varices. Any high-risk varices could be prophylactically banded at that time.  2.) She will continue to follow up with Dr. Annamaria Boots who is treating her multifocal hepatocellular carcinoma.   3.) I will see her again in 6 months with a repeat BRTO protocol CT scan unless she has had recent imaging for her underlying hepatocellular cancer which would likely be sufficient for evaluation.  Signed,  Criselda Peaches, MD    I spent a total of 15  minutes face to face in clinical consultation, greater than 50% of which was counseling/coordinating care for gastric varices.  SignedLaurence Ferrari, [place] 01/08/2014, 11:23 AM

## 2014-01-19 ENCOUNTER — Telehealth: Payer: Self-pay | Admitting: Family Medicine

## 2014-01-19 NOTE — Telephone Encounter (Signed)
Will forward to MD. Jazmin Hartsell,CMA  

## 2014-01-19 NOTE — Telephone Encounter (Signed)
Ms. Glazebrook is asking for the refill on her hydrocodone before her appt on Wed.  She says she's unable to take tylenol because it's npt gppd for her liver and the ibuprofen is not working.  Already have taken half a bottle this week.

## 2014-01-20 ENCOUNTER — Inpatient Hospital Stay (HOSPITAL_COMMUNITY): Payer: No Typology Code available for payment source | Admitting: Certified Registered Nurse Anesthetist

## 2014-01-20 ENCOUNTER — Emergency Department (HOSPITAL_COMMUNITY): Payer: No Typology Code available for payment source

## 2014-01-20 ENCOUNTER — Encounter (HOSPITAL_COMMUNITY): Payer: Self-pay | Admitting: Emergency Medicine

## 2014-01-20 ENCOUNTER — Inpatient Hospital Stay (HOSPITAL_COMMUNITY): Payer: No Typology Code available for payment source

## 2014-01-20 ENCOUNTER — Inpatient Hospital Stay (HOSPITAL_COMMUNITY)
Admission: EM | Admit: 2014-01-20 | Discharge: 2014-01-23 | DRG: 208 | Disposition: E | Payer: No Typology Code available for payment source | Attending: Internal Medicine | Admitting: Internal Medicine

## 2014-01-20 DIAGNOSIS — C801 Malignant (primary) neoplasm, unspecified: Secondary | ICD-10-CM

## 2014-01-20 DIAGNOSIS — N390 Urinary tract infection, site not specified: Secondary | ICD-10-CM | POA: Diagnosis present

## 2014-01-20 DIAGNOSIS — C220 Liver cell carcinoma: Secondary | ICD-10-CM | POA: Diagnosis present

## 2014-01-20 DIAGNOSIS — R188 Other ascites: Secondary | ICD-10-CM | POA: Diagnosis present

## 2014-01-20 DIAGNOSIS — K7460 Unspecified cirrhosis of liver: Secondary | ICD-10-CM | POA: Diagnosis present

## 2014-01-20 DIAGNOSIS — J189 Pneumonia, unspecified organism: Secondary | ICD-10-CM | POA: Diagnosis present

## 2014-01-20 DIAGNOSIS — D62 Acute posthemorrhagic anemia: Secondary | ICD-10-CM | POA: Diagnosis present

## 2014-01-20 DIAGNOSIS — E119 Type 2 diabetes mellitus without complications: Secondary | ICD-10-CM | POA: Diagnosis present

## 2014-01-20 DIAGNOSIS — B1920 Unspecified viral hepatitis C without hepatic coma: Secondary | ICD-10-CM | POA: Diagnosis present

## 2014-01-20 DIAGNOSIS — R0602 Shortness of breath: Secondary | ICD-10-CM

## 2014-01-20 DIAGNOSIS — I10 Essential (primary) hypertension: Secondary | ICD-10-CM | POA: Diagnosis present

## 2014-01-20 DIAGNOSIS — C799 Secondary malignant neoplasm of unspecified site: Secondary | ICD-10-CM

## 2014-01-20 DIAGNOSIS — E46 Unspecified protein-calorie malnutrition: Secondary | ICD-10-CM | POA: Diagnosis present

## 2014-01-20 DIAGNOSIS — Z01818 Encounter for other preprocedural examination: Secondary | ICD-10-CM

## 2014-01-20 DIAGNOSIS — R109 Unspecified abdominal pain: Secondary | ICD-10-CM

## 2014-01-20 DIAGNOSIS — I469 Cardiac arrest, cause unspecified: Secondary | ICD-10-CM | POA: Diagnosis not present

## 2014-01-20 DIAGNOSIS — Z9981 Dependence on supplemental oxygen: Secondary | ICD-10-CM | POA: Diagnosis not present

## 2014-01-20 DIAGNOSIS — R0902 Hypoxemia: Secondary | ICD-10-CM | POA: Diagnosis present

## 2014-01-20 DIAGNOSIS — Z87891 Personal history of nicotine dependence: Secondary | ICD-10-CM | POA: Diagnosis not present

## 2014-01-20 DIAGNOSIS — R092 Respiratory arrest: Secondary | ICD-10-CM

## 2014-01-20 DIAGNOSIS — R6510 Systemic inflammatory response syndrome (SIRS) of non-infectious origin without acute organ dysfunction: Secondary | ICD-10-CM | POA: Diagnosis present

## 2014-01-20 LAB — D-DIMER, QUANTITATIVE: D-Dimer, Quant: 3.67 ug/mL-FEU — ABNORMAL HIGH (ref 0.00–0.48)

## 2014-01-20 LAB — BLOOD GAS, ARTERIAL
Acid-base deficit: 11.9 mmol/L — ABNORMAL HIGH (ref 0.0–2.0)
BICARBONATE: 12.7 meq/L — AB (ref 20.0–24.0)
Drawn by: 307971
FIO2: 1 %
O2 Saturation: 91 %
PATIENT TEMPERATURE: 98.6
PH ART: 7.317 — AB (ref 7.350–7.450)
PO2 ART: 74.9 mmHg — AB (ref 80.0–100.0)
TCO2: 12 mmol/L (ref 0–100)
pCO2 arterial: 25.5 mmHg — ABNORMAL LOW (ref 35.0–45.0)

## 2014-01-20 LAB — CBC
HEMATOCRIT: 30.5 % — AB (ref 36.0–46.0)
Hemoglobin: 10.1 g/dL — ABNORMAL LOW (ref 12.0–15.0)
MCH: 30.6 pg (ref 26.0–34.0)
MCHC: 33.1 g/dL (ref 30.0–36.0)
MCV: 92.4 fL (ref 78.0–100.0)
PLATELETS: 321 10*3/uL (ref 150–400)
RBC: 3.3 MIL/uL — ABNORMAL LOW (ref 3.87–5.11)
RDW: 23.2 % — AB (ref 11.5–15.5)
WBC: 16.3 10*3/uL — ABNORMAL HIGH (ref 4.0–10.5)

## 2014-01-20 LAB — COMPREHENSIVE METABOLIC PANEL
ALT: 80 U/L — ABNORMAL HIGH (ref 0–35)
ANION GAP: 16 — AB (ref 5–15)
AST: 248 U/L — AB (ref 0–37)
Albumin: 2.2 g/dL — ABNORMAL LOW (ref 3.5–5.2)
Alkaline Phosphatase: 186 U/L — ABNORMAL HIGH (ref 39–117)
BILIRUBIN TOTAL: 8.9 mg/dL — AB (ref 0.3–1.2)
BUN: 21 mg/dL (ref 6–23)
CALCIUM: 8 mg/dL — AB (ref 8.4–10.5)
CHLORIDE: 102 meq/L (ref 96–112)
CO2: 17 mmol/L — ABNORMAL LOW (ref 19–32)
CREATININE: 1.11 mg/dL — AB (ref 0.50–1.10)
GFR calc Af Amer: 63 mL/min — ABNORMAL LOW (ref >90)
GFR calc non Af Amer: 54 mL/min — ABNORMAL LOW (ref >90)
Glucose, Bld: 61 mg/dL — ABNORMAL LOW (ref 70–99)
Potassium: 4.3 mmol/L (ref 3.5–5.1)
Sodium: 135 mmol/L (ref 135–145)
Total Protein: 6.8 g/dL (ref 6.0–8.3)

## 2014-01-20 LAB — PROTIME-INR
INR: 1.88 — AB (ref 0.00–1.49)
Prothrombin Time: 21.8 seconds — ABNORMAL HIGH (ref 11.6–15.2)

## 2014-01-20 LAB — URINE MICROSCOPIC-ADD ON

## 2014-01-20 LAB — URINALYSIS, ROUTINE W REFLEX MICROSCOPIC
Glucose, UA: NEGATIVE mg/dL
Hgb urine dipstick: NEGATIVE
Ketones, ur: NEGATIVE mg/dL
NITRITE: POSITIVE — AB
PH: 5.5 (ref 5.0–8.0)
Protein, ur: 100 mg/dL — AB
Specific Gravity, Urine: 1.021 (ref 1.005–1.030)
Urobilinogen, UA: 2 mg/dL — ABNORMAL HIGH (ref 0.0–1.0)

## 2014-01-20 LAB — MAGNESIUM: MAGNESIUM: 2.2 mg/dL (ref 1.5–2.5)

## 2014-01-20 LAB — MRSA PCR SCREENING: MRSA by PCR: NEGATIVE

## 2014-01-20 LAB — TYPE AND SCREEN
ABO/RH(D): O POS
Antibody Screen: NEGATIVE

## 2014-01-20 LAB — LIPASE, BLOOD: Lipase: 13 U/L (ref 11–59)

## 2014-01-20 LAB — TROPONIN I: TROPONIN I: 0.03 ng/mL (ref <0.031)

## 2014-01-20 LAB — TSH: TSH: 3.279 u[IU]/mL (ref 0.350–4.500)

## 2014-01-20 MED ORDER — ALUM & MAG HYDROXIDE-SIMETH 200-200-20 MG/5ML PO SUSP: 30.0000 mL | Freq: Four times a day (QID) | ORAL | Status: DC | PRN

## 2014-01-20 MED ORDER — SODIUM CHLORIDE 0.9 % IV BOLUS (SEPSIS)
500.0000 mL | Freq: Once | INTRAVENOUS | Status: DC
Start: 2014-01-20 — End: 2014-01-21

## 2014-01-20 MED ORDER — SODIUM CHLORIDE 0.9 % IV SOLN: INTRAVENOUS | Status: DC

## 2014-01-20 MED ORDER — SODIUM CHLORIDE 0.9 % IJ SOLN: 3.0000 mL | INTRAMUSCULAR | Status: DC | PRN

## 2014-01-20 MED ORDER — PROMETHAZINE HCL 25 MG PO TABS: 25.0000 mg | ORAL_TABLET | Freq: Four times a day (QID) | ORAL | Status: DC | PRN

## 2014-01-20 MED ORDER — VANCOMYCIN HCL IN DEXTROSE 1-5 GM/200ML-% IV SOLN
1000.0000 mg | Freq: Once | INTRAVENOUS | Status: AC
  Administered 2014-01-20: 1000 mg via INTRAVENOUS
  Filled 2014-01-20: qty 200

## 2014-01-20 MED ORDER — BOOST PLUS PO LIQD: 237.0000 mL | Freq: Three times a day (TID) | ORAL | Status: DC

## 2014-01-20 MED ORDER — SODIUM BICARBONATE 8.4 % IV SOLN: 100.0000 meq | Freq: Once | INTRAVENOUS | Status: DC

## 2014-01-20 MED ORDER — PANTOPRAZOLE SODIUM 40 MG PO TBEC
40.0000 mg | DELAYED_RELEASE_TABLET | Freq: Two times a day (BID) | ORAL | Status: DC
  Filled 2014-01-20: qty 1

## 2014-01-20 MED ORDER — PANTOPRAZOLE SODIUM 40 MG IV SOLR: 40.0000 mg | Freq: Every day | INTRAVENOUS | Status: DC

## 2014-01-20 MED ORDER — PIPERACILLIN-TAZOBACTAM 3.375 G IVPB
3.3750 g | Freq: Once | INTRAVENOUS | Status: AC
  Administered 2014-01-20: 3.375 g via INTRAVENOUS
  Filled 2014-01-20: qty 50

## 2014-01-20 MED ORDER — CHLORHEXIDINE GLUCONATE 0.12 % MT SOLN: 15.0000 mL | Freq: Two times a day (BID) | OROMUCOSAL | Status: DC

## 2014-01-20 MED ORDER — CETYLPYRIDINIUM CHLORIDE 0.05 % MT LIQD: 7.0000 mL | Freq: Four times a day (QID) | OROMUCOSAL | Status: DC

## 2014-01-20 MED ORDER — SODIUM CHLORIDE 0.9 % IV BOLUS (SEPSIS): 1000.0000 mL | Freq: Once | INTRAVENOUS | Status: DC

## 2014-01-20 MED ORDER — ONDANSETRON HCL 4 MG/2ML IJ SOLN: 4.0000 mg | Freq: Four times a day (QID) | INTRAMUSCULAR | Status: DC | PRN

## 2014-01-20 MED ORDER — PHENYLEPHRINE HCL 10 MG/ML IJ SOLN
0.0000 ug/min | INTRAVENOUS | Status: DC
  Filled 2014-01-20: qty 1

## 2014-01-20 MED ORDER — SODIUM BICARBONATE 8.4 % IV SOLN
INTRAVENOUS | Status: AC
  Filled 2014-01-20: qty 100

## 2014-01-20 MED ORDER — HEPARIN SODIUM (PORCINE) 5000 UNIT/ML IJ SOLN
5000.0000 [IU] | Freq: Three times a day (TID) | INTRAMUSCULAR | Status: DC
  Administered 2014-01-20: 5000 [IU] via SUBCUTANEOUS
  Filled 2014-01-20 (×3): qty 1

## 2014-01-20 MED ORDER — CARVEDILOL 6.25 MG PO TABS: 6.2500 mg | ORAL_TABLET | Freq: Two times a day (BID) | ORAL | Status: DC

## 2014-01-20 MED ORDER — NOREPINEPHRINE BITARTRATE 1 MG/ML IV SOLN
0.0000 ug/min | INTRAVENOUS | Status: DC
  Filled 2014-01-20: qty 4

## 2014-01-20 MED ORDER — HYDROMORPHONE HCL 1 MG/ML IJ SOLN
0.5000 mg | INTRAMUSCULAR | Status: DC | PRN
  Filled 2014-01-20 (×2): qty 1

## 2014-01-20 MED ORDER — DOPAMINE-DEXTROSE 3.2-5 MG/ML-% IV SOLN: 0.0000 ug/kg/min | INTRAVENOUS | Status: DC

## 2014-01-20 MED ORDER — LORAZEPAM 2 MG/ML IJ SOLN
0.5000 mg | Freq: Once | INTRAMUSCULAR | Status: DC
Start: 2014-01-20 — End: 2014-01-20

## 2014-01-20 MED ORDER — SODIUM CHLORIDE 0.9 % IV SOLN
500.0000 mg | Freq: Two times a day (BID) | INTRAVENOUS | Status: DC
  Filled 2014-01-20: qty 500

## 2014-01-20 MED ORDER — SODIUM CHLORIDE 0.9 % IV SOLN
INTRAVENOUS | Status: DC
  Administered 2014-01-20: 10 mL/h via INTRAVENOUS

## 2014-01-20 MED ORDER — ONDANSETRON HCL 4 MG PO TABS: 4.0000 mg | ORAL_TABLET | Freq: Four times a day (QID) | ORAL | Status: DC | PRN

## 2014-01-20 MED ORDER — SODIUM CHLORIDE 0.9 % IJ SOLN: 3.0000 mL | Freq: Two times a day (BID) | INTRAMUSCULAR | Status: DC

## 2014-01-20 MED ORDER — HYDROCODONE-ACETAMINOPHEN 5-325 MG PO TABS
1.0000 | ORAL_TABLET | ORAL | Status: DC | PRN
  Filled 2014-01-20: qty 2

## 2014-01-20 MED ORDER — HYDROMORPHONE HCL 1 MG/ML IJ SOLN
1.0000 mg | INTRAMUSCULAR | Status: DC | PRN
Start: 2014-01-20 — End: 2014-01-21
  Administered 2014-01-20 (×2): 0.5 mg via INTRAVENOUS
  Filled 2014-01-20: qty 1

## 2014-01-20 MED ORDER — SODIUM CHLORIDE 0.9 % IV SOLN: 250.0000 mL | INTRAVENOUS | Status: DC | PRN

## 2014-01-20 MED ORDER — PIPERACILLIN-TAZOBACTAM 3.375 G IVPB: 3.3750 g | Freq: Three times a day (TID) | INTRAVENOUS | Status: DC

## 2014-01-20 MED ORDER — LEVALBUTEROL HCL 0.63 MG/3ML IN NEBU: 0.6300 mg | INHALATION_SOLUTION | Freq: Four times a day (QID) | RESPIRATORY_TRACT | Status: DC | PRN

## 2014-01-20 MED ORDER — HYDROMORPHONE HCL 1 MG/ML IJ SOLN
0.5000 mg | Freq: Once | INTRAMUSCULAR | Status: AC
  Administered 2014-01-20: 0.5 mg via INTRAVENOUS
  Filled 2014-01-20: qty 1

## 2014-01-20 MED ORDER — DEXTROSE 5 % IV SOLN
30.0000 ug/min | INTRAVENOUS | Status: DC
  Filled 2014-01-20: qty 1

## 2014-01-20 NOTE — Telephone Encounter (Signed)
Unfortunately pt is going to have to be seen for this medication. Also, if pts. Pain is as severe as to need 1/2 of her narcotic prescription than I feel strongly that she be seen at an ED, urgent care, or our office.  I apologize for this. Please inform patient.  Thank you!

## 2014-01-20 NOTE — ED Notes (Signed)
Per ems pt from home co n/v abdominal since 12/25. Hx liver cancer. Pt presents with swollen abdomen. Unknown Hx of ascitis. Also reports lowe back pain. Pt alert and in obvious distress.

## 2014-01-20 NOTE — H&P (Addendum)
[facility] History and Physical  Whitney Patel WUX:[id number] DOB: [birth year] DOA: 12/26/2013  Referring physician: *  PCP: Elberta Leatherwood, MD   Chief Complaint: Central chest pain  Next   HPI:  58 year old female w sig h/o Hep C Cirrhosis and stage IIIA HCC, DM and HTN. She was s/p Drug Eluding Beads and Transarterial Chemoembolization (DEB/TACE) initially 8/25 w/ initial imaging demonstrated less enhancement of the multifocal tumor burden in the right hepatic lobe with areas of central necrosis, presents to the ED on 12/29 with central chest pain. Patient was found to be hypoxic requiring more than 4 L of oxygen. Subsequently switched to a nonrebreather. Denies any fever or chills. White count elevated at 16.3. Patchy infiltrate at the right base on lung x-ray. Patient also complaining of feeling bloated and distended. Patient denies any nausea vomiting, complaining of right upper quadrant abdominal pain, denies any hematochezia or melena. Hemoglobin has been stable.   She presented recently on 11/3 for repeat DEB/TACE, post-procedure developed hematemesis and was admitted for further evaluation. She was started on octerotide and PPI. 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 For this she underwent BRTO (Balloon-occluded Retrograde Transvenous Obliteration) on 11/6. On 11/4 she was noted to have persistent O2 dependence which primary team had treated as possible CAP vs aspiration. She has been advancing from a GI stand-point but as of requiring supplemental oxygen as of 11/9. 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  They recommended oxygen therapy 24 hours a day. She is not a candidate for pulmonary vasodilator therapy. Will recommend outpatient follow up with Pulmonology.     Review of Systems: negative for the following  Constitutional: Denies fever, chills, diaphoresis,  appetite change and fatigue.  HEENT: Denies photophobia, eye pain, redness, hearing loss, ear pain, congestion, sore throat, rhinorrhea, sneezing, mouth sores, trouble swallowing, neck pain, neck stiffness and tinnitus.  Respiratory: Denies SOB, DOE, cough, chest tightness, and wheezing.  Cardiovascular: Denies chest pain, palpitations and leg swelling.  Positive for nausea and abdominal pain. Negative for vomiting and diarrheaGenitourinary: Denies dysuria, urgency, frequency, hematuria, flank pain and difficulty urinating.  Musculoskeletal: Denies myalgias, back pain, joint swelling, arthralgias and gait problem.  Skin: Denies pallor, rash and wound.  Neurological: Denies dizziness, seizures, syncope, weakness, light-headedness, numbness and headaches.  Hematological: Denies adenopathy. Easy bruising, personal or family bleeding history  Psychiatric/Behavioral: Denies suicidal ideation, mood changes, confusion, nervousness, sleep disturbance and agitation       Past Medical History  Diagnosis Date  . Hypertension   . Diabetes mellitus   . Hepatitis C   . Hepatocellular carcinoma      Past Surgical History  Procedure Laterality Date  . Cesarean section  1981  . Hepatic artery embolization      08/2013 and again 11/26/2013  . Esophagogastroduodenoscopy N/A 11/26/2013    Procedure: ESOPHAGOGASTRODUODENOSCOPY (EGD);  Surgeon: Juanita Craver, MD;  Location: [facility];  Service: Endoscopy;  Laterality: N/A;      Social History:  reports that she quit smoking about 6 months ago. Her smoking use included Cigarettes. She started smoking about 42 years ago. She has a 7.5 pack-year smoking history. She has never used smokeless tobacco. She reports that she does not drink alcohol or use illicit drugs.    No Known Allergies  Family History  Problem Relation Age of Onset  . High blood  pressure Neg Hx   . Diabetes Neg Hx   . Cancer Neg Hx      Prior to Admission medications    Medication Sig Start Date End Date Taking? Authorizing Provider  albuterol (PROVENTIL) (2.5 MG/3ML) 0.083% nebulizer solution Take 3 mLs (2.5 mg total) by nebulization every 6 (six) hours as needed for wheezing or shortness of breath. 12/02/13  Yes Marianne L York, PA-C  amLODipine (NORVASC) 10 MG tablet Take 10 mg by mouth every morning.   Yes Historical Provider, MD  carvedilol (COREG) 12.5 MG tablet Take 6.25 mg by mouth 2 (two) times daily with a meal.   Yes Historical Provider, MD  hydrochlorothiazide (MICROZIDE) 12.5 MG capsule Take 1 capsule by mouth 2 (two) times daily. 11/18/13  Yes Historical Provider, MD  megestrol (MEGACE ES) 625 MG/5ML suspension Take 5 mLs (625 mg total) by mouth daily. 12/30/13  Yes Elberta Leatherwood, MD  pantoprazole (PROTONIX) 40 MG tablet Take 1 tablet (40 mg total) by mouth 2 (two) times daily. 12/02/13  Yes Bobby Rumpf York, PA-C  promethazine (PHENERGAN) 25 MG tablet Take 1 tablet (25 mg total) by mouth every 6 (six) hours as needed for nausea. 12/10/13  Yes Elberta Leatherwood, MD  HYDROcodone-acetaminophen (NORCO/VICODIN) 5-325 MG per tablet Take 1-2 tablets by mouth every 4 (four) hours as needed for moderate pain. Patient not taking: Reported on 12/23/2013 12/02/13   Melton Alar, PA-C  lactose free nutrition (BOOST PLUS) LIQD Take 237 mLs by mouth 3 (three) times daily with meals. Patient not taking: Reported on 12/25/2013 12/02/13   Melton Alar, PA-C     Physical Exam: Filed Vitals:   12/24/2013 1337 01/02/2014 1337 01/06/2014 1500 01/19/2014 1616  BP:   103/72 114/83  Pulse:   78 81  Resp:   20 29  SpO2: 82% 95% 95% 93%     HENT:  Mouth/Throat: Oropharynx is clear and moist.  Eyes: Conjunctivae are normal. Pupils are equal, round, and reactive to light. No scleral icterus.  Neck: Neck supple. No tracheal deviation present.  Cardiovascular: Normal rate, regular rhythm, normal heart sounds and intact distal pulses.  Pulmonary/Chest: Effort normal. No  respiratory distress.  Few rales r base  Abdominal: Soft. Normal appearance and bowel sounds are normal. She exhibits no distension and no mass. There is tenderness. There is no rebound and no guarding.  Upper abd tenderness. No rebound or guarding.  Genitourinary:  Mild cva tenderness.  Musculoskeletal: Normal range of motion. She exhibits no edema or tenderness.  Neurological: She is alert and oriented to person, place, and time.  Skin: Skin is warm and dry. No rash noted. She is not diaphoretic.  Psychiatric: She has a normal mood and affect.  Nursing note and vitals reviewed.    Labs on Admission:    Basic Metabolic Panel:  Recent Labs Lab 01/13/2014 1417  NA 135  K 4.3  CL 102  CO2 17*  GLUCOSE 61*  BUN 21  CREATININE 1.11*  CALCIUM 8.0*   Liver Function Tests:  Recent Labs Lab 12/23/2013 1417  AST 248*  ALT 80*  ALKPHOS 186*  BILITOT 8.9*  PROT 6.8  ALBUMIN 2.2*    Recent Labs Lab 01/19/2014 1417  LIPASE 13   No results for input(s): AMMONIA in the last 168 hours. CBC:  Recent Labs Lab 01/20/14 1417  WBC 16.3*  HGB 10.1*  HCT 30.5*  MCV 92.4  PLT 321   Cardiac Enzymes: No results for input(s): CKTOTAL,  CKMB, CKMBINDEX, TROPONINI in the last 168 hours.  BNP (last 3 results) No results for input(s): PROBNP in the last 8760 hours.    CBG: No results for input(s): GLUCAP in the last 168 hours.  Radiological Exams on Admission: Dg Chest Port 1 View  01/15/2014   CLINICAL DATA:  Three-day history of pain and shortness of breath. History of the hepatocellular carcinoma  EXAM: PORTABLE CHEST - 1 VIEW  COMPARISON:  Chest radiograph November 28, 2013 and chest CT November 30, 2013  FINDINGS: There is patchy infiltrate in the right base. Elsewhere lungs are clear. Heart is mildly enlarged with pulmonary vascularity within normal limits. No adenopathy. No bone lesions.  IMPRESSION: Patchy infiltrate right base. Elsewhere lungs clear. The nodular  opacities seen on recent CT are not appreciable on radiographic examination. Heart mildly enlarged.   Electronically Signed   By: Lowella Grip M.D.   On: 01/02/2014 15:28    EKG: Independently reviewed.  Assessment/Plan Active Problems:   Hypoxia   SIRS (systemic inflammatory response syndrome)   SIRS secondary to right lower lobe pneumonia/UTI Start the patient on vancomycin and Zosyn Patient will be admitted to stepdown given hypoxia Will order a d-dimer is abnormal, VQ scan tonight to rule out pulmonary embolism Patient is also on Megace which can increase risk of PE and DVT Hypoxemia could also be secondary to [facility] When necessary meds  Chest pain Cycle cardiac enzymes D-dimer is abnormal, VQ to rule out a PE  Abdominal pain Ultrasound to rule out ascites Paracentesis to rule out SBP   History of hepatocellular carcinoma Followed by Dr. Alen Blew  Normocytic anemia Likely due to chronic disease and acute blood loss. Hemoglobin has trended down slowly. Transfused 1 unit 11/9. Continue PPI  Protein calorie malnutrition May be severe based on weight loss and cachexia. Nutrition consultation    Code Status:   full Family Communication: bedside Disposition Plan: admit   Time spent: 70 mins   [facility] Pager [phone]  If 7PM-7AM, please contact night-coverage [email or web address] Password [facility] 01/11/2014, 5:34 PM

## 2014-01-20 NOTE — ED Notes (Signed)
This Agricultural consultant attempted to ween Pt off non-rebreather and place on [place].  Pt unable to maintain O2 Sat> 90% on 4L [place].  Pt placed back on non-rebreather.

## 2014-01-20 NOTE — ED Notes (Signed)
Dr Ashok Cordia made aware abourt pt's condition.

## 2014-01-20 NOTE — ED Notes (Signed)
Pt c/o sudden, constant central chest pain.  Sts she has previously had this pain and takes medication for it.  Will notify MD.

## 2014-01-20 NOTE — Progress Notes (Signed)
ANTIBIOTIC CONSULT NOTE - INITIAL  Pharmacy Consult for Vancomycin/Zosyn Indication: Sepsis  No Known Allergies  Patient Measurements: Weight: 125 lb 7.1 oz (56.9 kg) Height : 65 inches  Vital Signs: BP: 114/95 mmHg (12/29 1801) Pulse Rate: 89 (12/29 1801) Intake/Output from previous day:   Intake/Output from this shift:    Labs:  Recent Labs  12/29/2013 1417  WBC 16.3*  HGB 10.1*  PLT 321  CREATININE 1.11*   Estimated Creatinine Clearance: 50.8 mL/min (by C-G formula based on Cr of 1.11). No results for input(s): VANCOTROUGH, VANCOPEAK, VANCORANDOM, GENTTROUGH, GENTPEAK, GENTRANDOM, TOBRATROUGH, TOBRAPEAK, TOBRARND, AMIKACINPEAK, AMIKACINTROU, AMIKACIN in the last 72 hours.   Microbiology: No results found for this or any previous visit (from the past 720 hour(s)).  Medical History: Past Medical History  Diagnosis Date  . Hypertension   . Diabetes mellitus   . Hepatitis C   . Hepatocellular carcinoma     Medications:  Scheduled:  . heparin  5,000 Units Subcutaneous 3 times per day  . sodium chloride  3 mL Intravenous Q12H  . sodium chloride  3 mL Intravenous Q12H   Infusions:  . sodium chloride 10 mL/hr (01/08/2014 1432)  . sodium chloride    . sodium chloride    . sodium chloride    . piperacillin-tazobactam (ZOSYN)  IV 3.375 g (01/19/2014 1634)  . [START ON February 17, 2014] piperacillin-tazobactam (ZOSYN)  IV    . sodium chloride    . [START ON 02/17/14] vancomycin    . vancomycin 1,000 mg (01/13/2014 1750)   Assessment:  58 yr female with chest pain.  H/O Hep C Cirrhosis, hepatic CA, DM and HTN  Chest Xray shows infiltrate on lung  Plan VQ scan to r/o PE  Received Vancomycin 1gm x 1 and Zosyn 3.375gm x 1 in ED  Pharmacy consulted to continue dosing of Vancomycin and Zosyn for sepsis  Urine culture ordered  CrCl ~ 50 ml/min  Goal of Therapy:  Vancomycin trough level 15-20 mcg/ml  Plan:  Measure antibiotic drug levels at steady state Follow up  culture results  Zosyn 3.3375gm IV q8h (each dose infused over 4 hrs) Vancomycin 500mg  IV q12h  Asianae Minkler, Toribio Harbour, PharmD 12/29/2013,6:10 PM

## 2014-01-20 NOTE — ED Provider Notes (Addendum)
CSN: [id number]     Arrival date & time 01/19/2014  1321 History   First MD Initiated Contact with Patient 01/02/2014 1359     Chief Complaint  Patient presents with  . N/V Abd pain     liver CA     (Consider location/radiation/quality/duration/timing/severity/associated sxs/prior Treatment) The history is provided by the patient.  pt with hx advanced hepatocellular ca, c/o upper abdominal pain in the past several weeks, becoming progressively worse. Pain constant, dull, non radiating. Located in bil upper abd and esp left  flank. No specific exacerbating or alleviating factors. +nausea. No vomiting. Had small bm today. Pt denies any recent chemotherapy, and states is unsure of status of ca.  Pt denies fever or chills. Pt v poor historian - level 5 caveat.      Past Medical History  Diagnosis Date  . Hypertension   . Diabetes mellitus   . Hepatitis C   . Hepatocellular carcinoma    Past Surgical History  Procedure Laterality Date  . Cesarean section  1981  . Hepatic artery embolization      08/2013 and again 11/26/2013  . Esophagogastroduodenoscopy N/A 11/26/2013    Procedure: ESOPHAGOGASTRODUODENOSCOPY (EGD);  Surgeon: Juanita Craver, MD;  Location: [facility];  Service: Endoscopy;  Laterality: N/A;   Family History  Problem Relation Age of Onset  . High blood pressure Neg Hx   . Diabetes Neg Hx   . Cancer Neg Hx    History  Substance Use Topics  . Smoking status: Current Some Day Smoker -- 0.25 packs/day for 30 years    Types: Cigarettes    Start date: 10/21/1971  . Smokeless tobacco: Never Used     Comment: 1 pack per week  . Alcohol Use: No     Comment: occasion   OB History    No data available     Review of Systems  Constitutional: Negative for fever and chills.  HENT: Negative for sore throat.   Eyes: Negative for redness.  Respiratory: Negative for cough and shortness of breath.   Cardiovascular: Negative for chest pain and leg swelling.  Gastrointestinal:  Positive for nausea and abdominal pain. Negative for vomiting and diarrhea.  Genitourinary: Negative for dysuria and flank pain.  Musculoskeletal: Negative for back pain and neck pain.  Skin: Negative for rash.  Neurological: Negative for headaches.  Hematological: Does not bruise/bleed easily.  Psychiatric/Behavioral: Negative for confusion.      Allergies  Review of patient's allergies indicates no known allergies.  Home Medications   Prior to Admission medications   Medication Sig Start Date End Date Taking? Authorizing Provider  albuterol (PROVENTIL) (2.5 MG/3ML) 0.083% nebulizer solution Take 3 mLs (2.5 mg total) by nebulization every 6 (six) hours as needed for wheezing or shortness of breath. 12/02/13  Yes Marianne L York, PA-C  amLODipine (NORVASC) 10 MG tablet Take 10 mg by mouth every morning.   Yes Historical Provider, MD  carvedilol (COREG) 12.5 MG tablet Take 6.25 mg by mouth 2 (two) times daily with a meal.   Yes Historical Provider, MD  hydrochlorothiazide (MICROZIDE) 12.5 MG capsule Take 1 capsule by mouth 2 (two) times daily. 11/18/13  Yes Historical Provider, MD  megestrol (MEGACE ES) 625 MG/5ML suspension Take 5 mLs (625 mg total) by mouth daily. 12/30/13  Yes Elberta Leatherwood, MD  pantoprazole (PROTONIX) 40 MG tablet Take 1 tablet (40 mg total) by mouth 2 (two) times daily. 12/02/13  Yes Marianne L York, PA-C  promethazine (PHENERGAN) 25  MG tablet Take 1 tablet (25 mg total) by mouth every 6 (six) hours as needed for nausea. 12/10/13  Yes Elberta Leatherwood, MD  HYDROcodone-acetaminophen (NORCO/VICODIN) 5-325 MG per tablet Take 1-2 tablets by mouth every 4 (four) hours as needed for moderate pain. Patient not taking: Reported on 01/02/2014 12/02/13   Melton Alar, PA-C  lactose free nutrition (BOOST PLUS) LIQD Take 237 mLs by mouth 3 (three) times daily with meals. Patient not taking: Reported on 01/08/2014 12/02/13   Bobby Rumpf York, PA-C   BP 110/82 mmHg  Pulse 80  Resp  24  SpO2 95% Physical Exam  Constitutional: She is oriented to person, place, and time. She appears well-developed and well-nourished. No distress.  HENT:  Mouth/Throat: Oropharynx is clear and moist.  Eyes: Conjunctivae are normal. Pupils are equal, round, and reactive to light. No scleral icterus.  Neck: Neck supple. No tracheal deviation present.  Cardiovascular: Normal rate, regular rhythm, normal heart sounds and intact distal pulses.   Pulmonary/Chest: Effort normal. No respiratory distress.  Few rales r base  Abdominal: Soft. Normal appearance and bowel sounds are normal. She exhibits no distension and no mass. There is tenderness. There is no rebound and no guarding.  Upper abd tenderness. No rebound or guarding.   Genitourinary:  Mild cva tenderness.   Musculoskeletal: Normal range of motion. She exhibits no edema or tenderness.  Neurological: She is alert and oriented to person, place, and time.  Skin: Skin is warm and dry. No rash noted. She is not diaphoretic.  Psychiatric: She has a normal mood and affect.  Nursing note and vitals reviewed.   ED Course  Procedures (including critical care time) Labs Review   Results for orders placed or performed during the hospital encounter of 01/20/2014  CBC  Result Value Ref Range   WBC 16.3 (H) 4.0 - 10.5 K/uL   RBC 3.30 (L) 3.87 - 5.11 MIL/uL   Hemoglobin 10.1 (L) 12.0 - 15.0 g/dL   HCT 30.5 (L) 36.0 - 46.0 %   MCV 92.4 78.0 - 100.0 fL   MCH 30.6 26.0 - 34.0 pg   MCHC 33.1 30.0 - 36.0 g/dL   RDW 23.2 (H) 11.5 - 15.5 %   Platelets 321 150 - 400 K/uL  Urinalysis, Routine w reflex microscopic  Result Value Ref Range   Color, Urine ORANGE (A) YELLOW   APPearance TURBID (A) CLEAR   Specific Gravity, Urine 1.021 1.005 - 1.030   pH 5.5 5.0 - 8.0   Glucose, UA NEGATIVE NEGATIVE mg/dL   Hgb urine dipstick NEGATIVE NEGATIVE   Bilirubin Urine LARGE (A) NEGATIVE   Ketones, ur NEGATIVE NEGATIVE mg/dL   Protein, ur 100 (A) NEGATIVE  mg/dL   Urobilinogen, UA 2.0 (H) 0.0 - 1.0 mg/dL   Nitrite POSITIVE (A) NEGATIVE   Leukocytes, UA SMALL (A) NEGATIVE  Protime-INR  Result Value Ref Range   Prothrombin Time 21.8 (H) 11.6 - 15.2 seconds   INR 1.88 (H) 0.00 - 1.49  Urine microscopic-add on  Result Value Ref Range   Squamous Epithelial / LPF FEW (A) RARE   WBC, UA 21-50 <3 WBC/hpf   Bacteria, UA MANY (A) RARE   Casts WBC CAST (A) NEGATIVE   Urine-Other AMORPHOUS URATES/PHOSPHATES   Type and screen  Result Value Ref Range   ABO/RH(D) O POS    Antibody Screen NEG    Sample Expiration 01/23/2014    Ct Abd Wo & W Cm  01/08/2014   CLINICAL DATA:  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 Subsequent encounter.  EXAM: CT ABDOMEN WITHOUT AND WITH CONTRAST  TECHNIQUE: Multidetector CT imaging of the abdomen was performed following the standard protocol before and following the bolus administration of intravenous contrast.  CONTRAST:  122mL OMNIPAQUE IOHEXOL 300 MG/ML  SOLN  COMPARISON:  Prior CTs dated 10/28/2013 and 11/29/2013.  FINDINGS: Lower chest: There is increased atelectasis at the right lung base. There are several small nodules at the right lung base which were not previously change are indeterminate in etiology. There is no pleural effusion. The heart is mildly enlarged.  Hepatobiliary: Again demonstrated are widespread changes of advanced cirrhosis with hepatomegaly, diffuse contour irregularity and extreme hypervascular nodularity following contrast. Multiple areas vascular shunting are again demonstrated. The liver measures at least 26.6 cm in length, similar to prior study. The individual foci of the multifocal hepatocellular carcinoma remain difficult to measure given the heterogeneous enhancement and irregular margins. However, continued slight improvement is suggested. Index lesions include 1 in segment 6 measuring 4.6 x 4.3 cm (image 42-previously 4.5 x 4.2 cm). The largest lesion in  segment 5 measures approximately 4.9 x 4.4 cm on image 46 (previously 5.3 x 5.4 cm. The gallbladder appears stable with small stones and nodular enhancement within its wall. There is no biliary dilatation.  Pancreas: Unremarkable. No pancreatic ductal dilatation or surrounding inflammatory changes.  Spleen: Normal in size without focal abnormality.  Adrenals/Urinary Tract: Both adrenal glands appear normal.There is a simple cyst in the upper pole of the right kidney which appears stable. No hydronephrosis or suspicious renal lesions demonstrated.  Stomach/Bowel: The stomach is incompletely distended. There is stable mild generalized bowel wall thickening attributed to liver disease. No evidence of bowel obstruction.  Vascular/Lymphatic: Prominent lymph nodes within the porta hepatis are grossly stable. There is stable mild aortoiliac atherosclerosis. There is significant portosystemic shunting. The portal vein is patent without evidence of thrombus. Radiopaque sclerosing agent is noted at the splenic hilum status post obliteration of gastric varices. Sclerosing agent within the portal vein has decreased in volume.  Other: No evidence of abdominal wall mass or hernia.  Musculoskeletal: No acute or significant osseous findings. Stable convex left lumbar scoliosis.  IMPRESSION: 1. Interval partial resolution of spillover of sclerosis into the portal splenic confluence. No evidence of portal vein thrombosis. 2. Minimal improvement in widespread hepatocellular carcinoma with marked enlargement of the liver and widespread vascular shunting. 3. Stable incidental findings.  No acute findings demonstrated.   Electronically Signed   By: Camie Patience M.D.   On: 01/08/2014 08:55   Dg Chest Port 1 View  12/24/2013   CLINICAL DATA:  Three-day history of pain and shortness of breath. History of the hepatocellular carcinoma  EXAM: PORTABLE CHEST - 1 VIEW  COMPARISON:  Chest radiograph November 28, 2013 and chest CT November 30, 2013  FINDINGS: There is patchy infiltrate in the right base. Elsewhere lungs are clear. Heart is mildly enlarged with pulmonary vascularity within normal limits. No adenopathy. No bone lesions.  IMPRESSION: Patchy infiltrate right base. Elsewhere lungs clear. The nodular opacities seen on recent CT are not appreciable on radiographic examination. Heart mildly enlarged.   Electronically Signed   By: Lowella Grip M.D.   On: 12/31/2013 15:28      MDM   Iv ns. Labs.  Reviewed nursing notes and prior charts for additional history.   Dilaudid iv for pain relief.  ua pos, 21-50 wbc - will culture and rx.   ?  infiltrate on cxr, pt noted w chronic hpyoxia (per notes/d/c summary 11/15) - will cover w abx as tx uti.  Discussed admission w pt, and as pcp is with [facility] [place] admission to [facility] - pt indicates she does not want to be admitted/transferred to [facility], requests admit to [facility], states her oncologists are here, and she does not want to go to [facility].    Hospitalists called.      Mirna Mires, MD 12/26/2013 1630

## 2014-01-20 NOTE — ED Notes (Signed)
Bed: WA03 Expected date:  Expected time:  Means of arrival:  Comments: EMS- abdominal pain, Liver CA

## 2014-01-21 ENCOUNTER — Ambulatory Visit: Payer: No Typology Code available for payment source | Admitting: Family Medicine

## 2014-01-21 ENCOUNTER — Inpatient Hospital Stay (HOSPITAL_COMMUNITY): Payer: No Typology Code available for payment source

## 2014-01-21 LAB — URINE CULTURE
CULTURE: NO GROWTH
Colony Count: NO GROWTH

## 2014-01-21 MED ORDER — HEPARIN (PORCINE) IN NACL 100-0.45 UNIT/ML-% IJ SOLN
1100.0000 [IU]/h | INTRAMUSCULAR | Status: DC
  Filled 2014-01-21: qty 250

## 2014-01-21 MED FILL — Medication: Qty: 1 | Status: AC

## 2014-01-22 NOTE — Progress Notes (Signed)
Utilization review completed.  

## 2014-01-23 NOTE — Progress Notes (Signed)
RT extubated Pt per NP at bedside, Pt has had CPR X's 3 and is now a DNR.  RT to monitor and assess as needed.

## 2014-01-23 NOTE — Code Documentation (Signed)
[facility]  Department of Emergency Medicine   Code Blue CONSULT NOTE  Chief Complaint: Cardiac arrest/unresponsive   Level V Caveat: Unresponsive  History of present illness: I was contacted by the hospital for a CODE BLUE cardiac arrest upstairs and presented to the patient's bedside.  32F here with hepatocellular carcinoma, admitted to stepdown with possbile PNA and a UTI. HRs dropped and CPR was initiated. I came to the bedside after about 3 minutes of CPR after 1 dose of epinephrine.  ROS: Unable to obtain, Level V caveat  Scheduled Meds: . antiseptic oral rinse  7 mL Mouth Rinse QID  . carvedilol  6.25 mg Oral BID WC  . chlorhexidine  15 mL Mouth Rinse BID  . pantoprazole (PROTONIX) IV  40 mg Intravenous Daily  . piperacillin-tazobactam (ZOSYN)  IV  3.375 g Intravenous Q8H  . sodium bicarbonate      . sodium bicarbonate  100 mEq Intravenous Once  . sodium chloride  1,000 mL Intravenous Once  . sodium chloride  500 mL Intravenous Once  . sodium chloride  3 mL Intravenous Q12H  . sodium chloride  3 mL Intravenous Q12H  . vancomycin  500 mg Intravenous Q12H   Continuous Infusions: . sodium chloride 10 mL/hr (01/14/2014 1432)  . sodium chloride 75 mL/hr at 12/31/2013 2021  . DOPamine    . norepinephrine (LEVOPHED) Adult infusion    . phenylephrine (NEO-SYNEPHRINE) Adult infusion     PRN Meds:.sodium chloride, alum & mag hydroxide-simeth, HYDROmorphone (DILAUDID) injection, levalbuterol, ondansetron **OR** ondansetron (ZOFRAN) IV, promethazine, sodium chloride Past Medical History  Diagnosis Date  . Hypertension   . Diabetes mellitus   . Hepatitis C   . Hepatocellular carcinoma    Past Surgical History  Procedure Laterality Date  . Cesarean section  1981  . Hepatic artery embolization      08/2013 and again 11/26/2013  . Esophagogastroduodenoscopy N/A 11/26/2013    Procedure: ESOPHAGOGASTRODUODENOSCOPY (EGD);  Surgeon: Juanita Craver, MD;  Location: [facility]  ENDOSCOPY;  Service: Endoscopy;  Laterality: N/A;   History   Social History  . Marital Status: Legally Separated    Spouse Name: N/A    Number of Children: N/A  . Years of Education: N/A   Occupational History  . Not on file.   Social History Main Topics  . Smoking status: Former Smoker -- 0.25 packs/day for 30 years    Types: Cigarettes    Start date: 10/21/1971    Quit date: 07/21/2013  . Smokeless tobacco: Never Used  . Alcohol Use: No     Comment: occasion  . Drug Use: No  . Sexual Activity: No   Other Topics Concern  . Not on file   Social History Narrative   Lives alone at home.  No assist devices or home health services.     No Known Allergies  Last set of Vital Signs (not current) Filed Vitals:   01/07/2014 2000  BP:   Pulse:   Temp: 96.2 F (35.7 C)  Resp:       Physical Exam Patient here with agonal breathing, in distress Gen: unresponsive Cardiovascular: pulseless  Resp: apneic. Breath sounds equal bilaterally with bagging  Abd: nondistended  Neuro: GCS 3, unresponsive to pain  HEENT: No blood in posterior pharynx, gag reflex absent  Neck: No crepitus  Musculoskeletal: No deformity  Skin: warm  Procedures  INTUBATION Performed by CRNA.  CRITICAL CARE Performed by: Osvaldo Shipper Total critical care time: 30 minutes Critical care  time was exclusive of separately billable procedures and treating other patients. Critical care was necessary to treat or prevent imminent or life-threatening deterioration. Critical care was time spent personally by me on the following activities: development of treatment plan with patient and/or surrogate as well as nursing, discussions with consultants, evaluation of patient's response to treatment, examination of patient, obtaining history from patient or surrogate, ordering and performing treatments and interventions, ordering and review of laboratory studies, ordering and review of radiographic studies,  pulse oximetry and re-evaluation of patient's condition.  Cardiopulmonary Resuscitation (CPR) Procedure Note Multiple rounds Directed/Performed by: Osvaldo Shipper I personally directed ancillary staff and/or performed CPR in an effort to regain return of spontaneous circulation and to maintain cardiac, neuro and systemic perfusion.    Medical Decision making  Patient in distress. Multiple rounds of CPR supervised by me, I spent about 40 minutes at the bedside. I attempt R subclavian vein cannulation, however patient's HR dropped and I was unable to finish the central line because she required CPR. We initiated dopamine peripherally in the mean time for BP support. BPs in the 60s. O2 sats in the 40s.  Dr. Jimmy Footman with critical care observed through the tele-critical care software for most of the resuscitation. Every rhythm was PEA, no shockable arrhythmias. After multiple rounds of CPR with only brief (<5 min) periods, we (me, Dr. Jimmy Footman, PA Baltazar Najjar with [facility]) felt further resuscitative efforts would be medically futile.  Assessment and Plan  No further resuscitative efforts attempted after numerous rounds of CPR with brief periods of ROSC with rapid decline back to PEA.

## 2014-01-23 NOTE — Progress Notes (Addendum)
Shift event: Pt admitted earlier on 01/14/2014 with chest pain and hypoxia. Pt has a hx of Hep C with hepatic carcinoma, hepatic tumor, and ascites. A DDimer was done to help r/o PE, but pt's renal function impaired and could not have CTA chest. VQ scan was ordered for am. DDimer did not result until later in shift. Troponins have been negative since admission.  During shift,  BP has ranged between 563-875 systolic except for one episode of BP in the 80s with normal MAP. HR has remained 83-99 during shift. O2 sats normal.  At 2327, called to room emergently secondary to pt being PEA. CODE/CPR started immediately at 2327 with usual ACLS protocol. After one round of ACLS, pt's pulse returned with BP. Pt intubated and bagged and placed on ventilator. Shortly thereafter, pt was pulseless again and ACLS resumed. ED Dr. Mingo Amber also present. After 2nd round of ACLS, pulse returned. BP low and pressors started along with usual rounds of ACLS meds. Central line was attempted, but pt became pulseless again. CPR restarted. This NP had attempted to call daughter 7 times at number on chart without success. This NP left general message x 2 to "call unit urgently" without leaving the pt's name and still no returned call from daughter.  Therefore, care was deemed futile by this NP, PCCM, and ED Dr. Mingo Amber after 3 rounds of ACLS and code was stopped at [place].  Pt was extubated at this point and time of death was 0015. Pronounced by this NP and Wynetta Emery, Therapist, sports.  Have again attempted to call daughter without success.  Death certificate completed and given to unit secretary. Dr. Hal Hope aware of Code and decisions.  Clance Boll, NP [facility] Addendum: Still could not reach daughter. Pt had cell phone, so looked for last called number which might be a family member. Number was labeled "aunt winnie".  Called number and was pt's aunt and son in [place], [place]. Informed son about pt's death and circumstances surrounding  this. Son did not know any other number for his sister.  KJKG, NP Update: Around 3am, pt's husband showed up to ED asking about his wife (the pt). This NP and Asa Saunas, went to speak to husband and friend that was present. Husband's number nor name were on the pt's record. Husband states the son in [place], [place], called him. This NP relayed the unfortunate news. Husband/friend coping well. Clarise Cruz took husband/friend to see body in the morgue. Daughter still doesn't know about her mother. Pt's husband states he will go to her house after this to let her know.  KJKG, NP

## 2014-01-23 NOTE — Telephone Encounter (Signed)
Reviewed chart to call patient.  Deceased.  Janaiah Vetrano,CMA

## 2014-01-23 NOTE — Progress Notes (Signed)
  CARE MANAGEMENT ED NOTE 02/04/2014  Patient:  Whitney Patel, Whitney Patel   Account Number:  [id number]  Date Initiated:  04-Feb-2014  Documentation initiated by:  Livia Snellen  Subjective/Objective Assessment:   Patient presents to Ed with abdominal pain, chest pain and hypoxia     Subjective/Objective Assessment Detail:   Patient with pmhx of HTN, DM Hep C and hepatocellular carcinoma  WBC 16.3     Action/Plan:   IV ABX , IV, fluids, paracentesis   Action/Plan Detail:   Anticipated DC Date:       Status Recommendation to Physician:   Result of Recommendation:    Other ED Services  Consult Working Ola  Other    Choice offered to / List presented to:       [facility] arranged  HH-1 RN      [place].    Status of service:  Completed, signed off  ED Comments:   ED Comments Detail:  EDCM spoke to patient and her daughter Alverta at bedisde. Alverta's phone number [phone].  Patient confirms she has home health services with [facility] for an RN only.  Patient reports that she needs an aide.  [facility] informed patient that she may also receive an aide with [facility].  Patient's daughter lives close by and checks in on the patient frequently. Patient reports she has been completing her ADL's on her own and making her own meals at home.  Patient also wears oxygen at home supplied by [facility].  Patient's daughter reports "she has tanks and the concentrator."  Patient's daughter reports patient needs a walker and a shower chair at home as patient slipped in shower at home.  No further EDCM needs at this time.

## 2014-01-23 NOTE — Progress Notes (Signed)
Waste Dilaudid. 2 vials with 0.5mg  each wasted in sharps container. Witnessed by Nelva Bush, RN.

## 2014-01-23 NOTE — Progress Notes (Signed)
ANTICOAGULATION CONSULT NOTE - Initial Consult  Pharmacy Consult for Heparin Indication: Rule out PE  No Known Allergies  Patient Measurements: Weight: 125 lb 7.1 oz (56.9 kg)  Height: 65 inches Ideal body weight: 57 kg Heparin Dosing Weight: actual weight  Vital Signs: Temp: 96.2 F (35.7 C) (12/29 2000) Temp Source: Rectal (12/29 2000) BP: 114/95 mmHg (12/29 1801) Pulse Rate: 89 (12/29 1801)  Labs:  Recent Labs  01/13/2014 1417 01/03/2014 2018  HGB 10.1*  --   HCT 30.5*  --   PLT 321  --   LABPROT 21.8*  --   INR 1.88*  --   CREATININE 1.11*  --   TROPONINI  --  0.03    Estimated Creatinine Clearance: 50.8 mL/min (by C-G formula based on Cr of 1.11).   Medical History: Past Medical History  Diagnosis Date  . Hypertension   . Diabetes mellitus   . Hepatitis C   . Hepatocellular carcinoma     Medications:  Scheduled:  . antiseptic oral rinse  7 mL Mouth Rinse QID  . carvedilol  6.25 mg Oral BID WC  . chlorhexidine  15 mL Mouth Rinse BID  . pantoprazole (PROTONIX) IV  40 mg Intravenous Daily  . piperacillin-tazobactam (ZOSYN)  IV  3.375 g Intravenous Q8H  . sodium bicarbonate      . sodium bicarbonate  100 mEq Intravenous Once  . sodium chloride  1,000 mL Intravenous Once  . sodium chloride  500 mL Intravenous Once  . sodium chloride  3 mL Intravenous Q12H  . sodium chloride  3 mL Intravenous Q12H  . vancomycin  500 mg Intravenous Q12H   Infusions:  . sodium chloride 10 mL/hr (01/01/2014 1432)  . sodium chloride 75 mL/hr at 01/19/2014 2021  . DOPamine    . norepinephrine (LEVOPHED) Adult infusion    . phenylephrine (NEO-SYNEPHRINE) Adult infusion     PRN: sodium chloride, alum & mag hydroxide-simeth, HYDROmorphone (DILAUDID) injection, levalbuterol, ondansetron **OR** ondansetron (ZOFRAN) IV, promethazine, sodium chloride  Assessment: 59 year old female w sig h/o Hep C Cirrhosis and stage IIIA HCC, DM and HTN. She was s/p Drug Eluding Beads and  Transarterial Chemoembolization (DEB/TACE) initially 8/25 w/ initial imaging demonstrated less enhancement of the multifocal tumor burden in the right hepatic lobe with areas of central necrosis.  She presented recently on 11/3 for repeat DEB/TACE, post-procedure developed hematemesis and was admitted for further evaluation. She was started on octerotide and PPI. 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 For this she underwent BRTO (Balloon-occluded Retrograde Transvenous Obliteration) on 11/6. On 11/4 she was noted to have persistent O2 dependence which primary team had treated as possible CAP vs aspiration. She has been advancing from a GI stand-point but as of requiring supplemental oxygen as of 11/9. 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  They recommended oxygen therapy 24 hours a day.  She now presents to the ED on 12/29 with central chest pain. Patient was found to be hypoxic requiring more than 4 L of oxygen.  Ddimer was elevated and VQ scan is ordered to r/o PE. Pharmacy is consulted to dose IV heparin.  Goal of Therapy:  Heparin level 0.3-0.7 units/ml Monitor platelets by anticoagulation protocol: Yes   Plan:   No heparin bolus since received 5000 units SQ heparin at 20:41 and recent history hematemesis  Heparin 1100 units/hr  Check  heparin level in 8 hours  Heparin level and CBC daily  Monitor carefully for signs/symptoms of bleeding  Peggyann Juba, PharmD, BCPS Pager: [phone] 12-Feb-2014,12:02 AM

## 2014-01-23 DEATH — deceased

## 2014-01-30 ENCOUNTER — Telehealth: Payer: Self-pay | Admitting: Family Medicine

## 2014-01-30 NOTE — Telephone Encounter (Signed)
I'm going to need another form. I believe I shredded it b/c of her passing. I apologize for this.

## 2014-01-30 NOTE — Telephone Encounter (Signed)
Left message on voicemail of Whitney Patel asking that she refax form.

## 2014-01-30 NOTE — Telephone Encounter (Signed)
Checking status of oxygen equipment form that was faxed over last week. Pt is deceased but insurance still requires the form.

## 2014-02-19 ENCOUNTER — Ambulatory Visit: Payer: No Typology Code available for payment source | Admitting: Oncology

## 2015-09-23 IMAGING — CT CT CHEST W/O CM
2 of 4 series · 15 of 36 positions shown, 18 images · non-contrast
Comparison: Chest radiographs dated 11/28/2013 and abdomen CT
obtained yesterday.

CLINICAL DATA: Hemoptysis. The patient is a 58 y.o. year-old female
with history of hepatitis C with cirrhosis and stage IIIA HCC, DM,
HTN. She completed combined bland embolization and OBERT/FARUNA on
09/16/2013 and had since recovered. Imaging demonstrated less
enhancement of the multifocal tumor burden in the right hepatic lobe
with areas of central necrosis. Coughing up blood Hx of hep c and
hepatocellular ca

EXAM:
CT CHEST WITHOUT CONTRAST
TECHNIQUE: Multidetector CT imaging of the chest was performed following the
standard protocol without IV contrast..

[Series 2: thorax 5.0 i31f 1 · axial · 0.59mm/px · z∈[-706,-481]mm · 12 of 53 slices shown, 15 images]
[im 4/53  mediastinal]
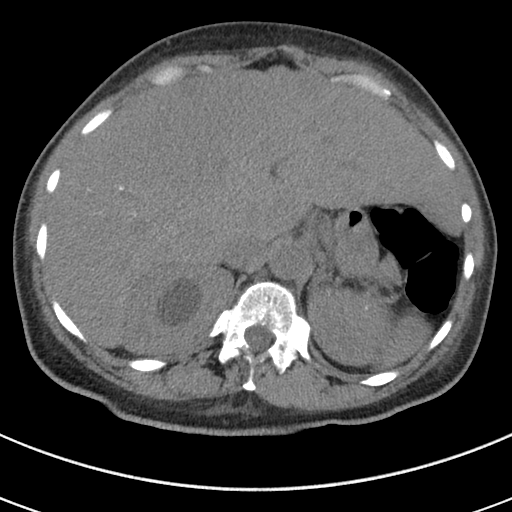
[im 4/53  lung]
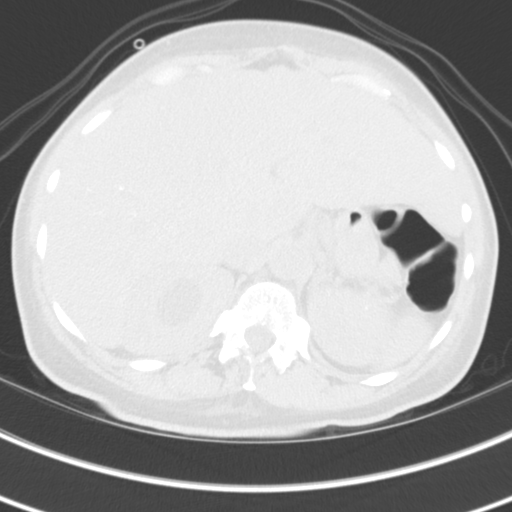
[im 8/53  lung]
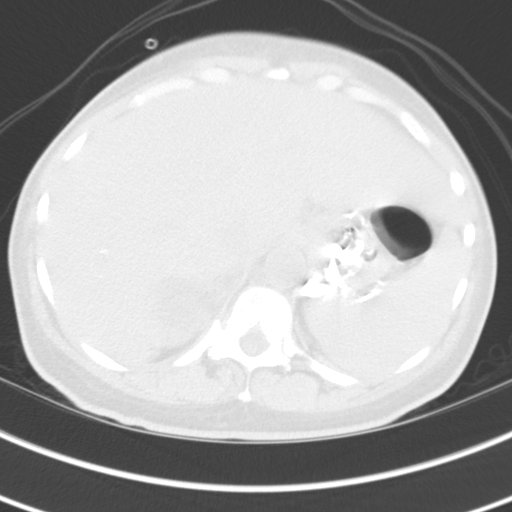
[im 12/53  lung]
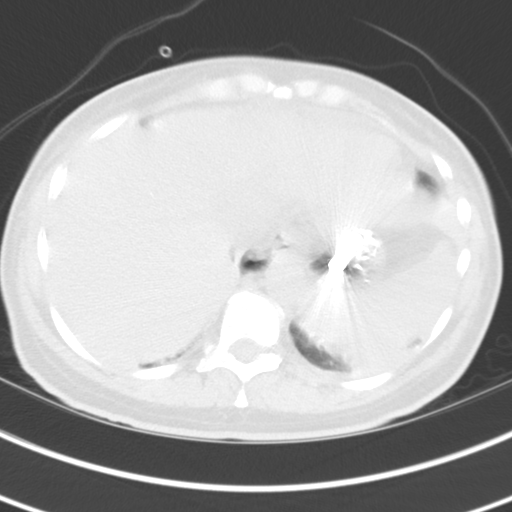
[im 15/53  lung]
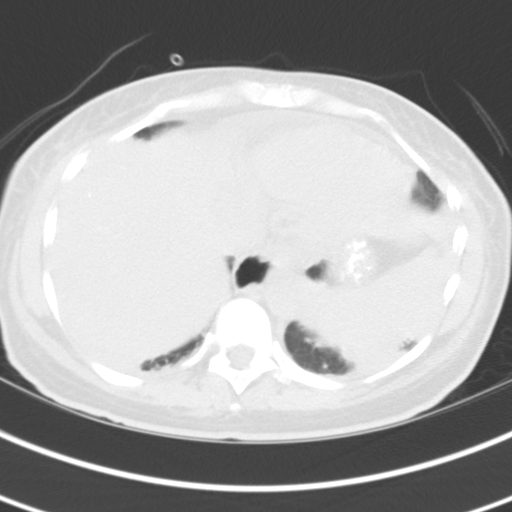
[im 19/53  mediastinal]
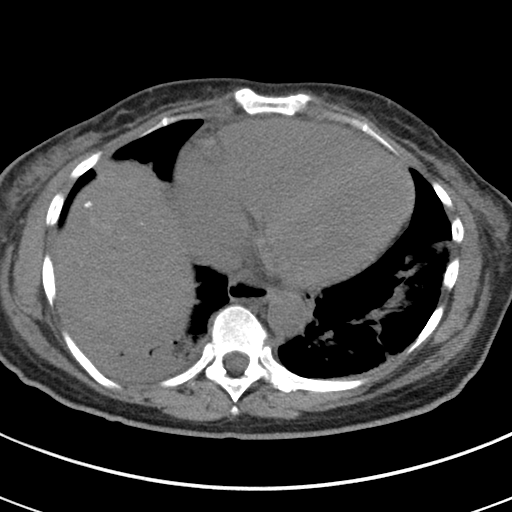
[im 19/53  lung]
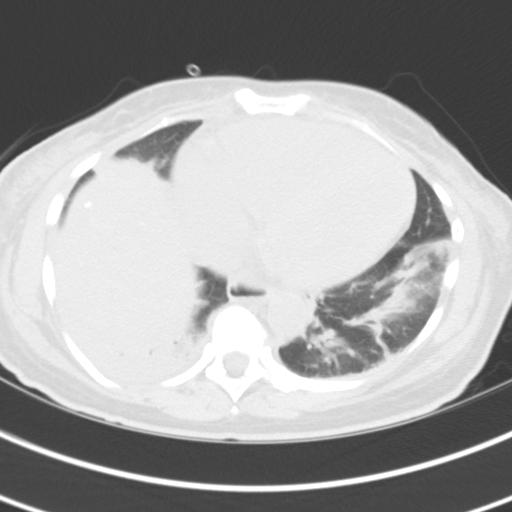
[im 23/53  lung]
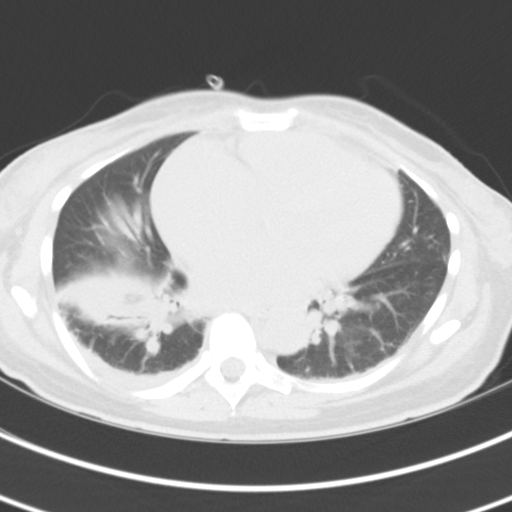
[im 30/53  lung]
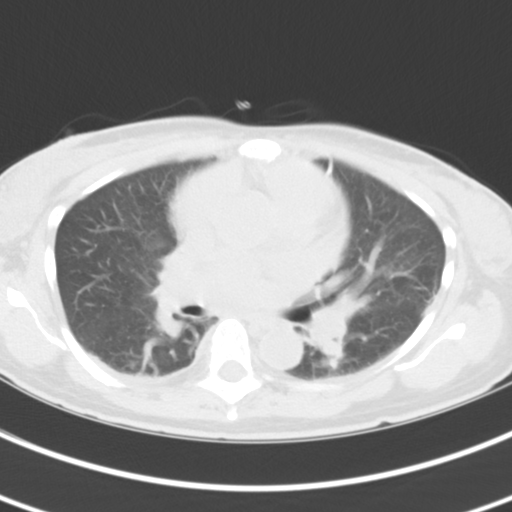
[im 34/53  lung]
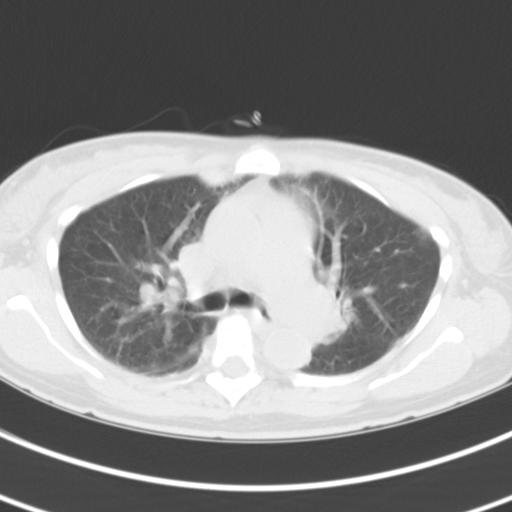
[im 38/53  mediastinal]
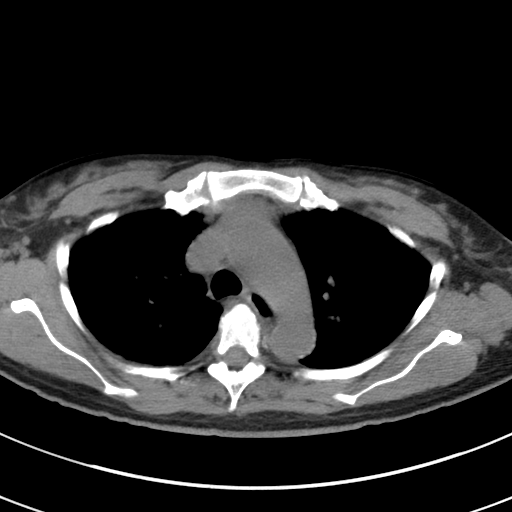
[im 38/53  lung]
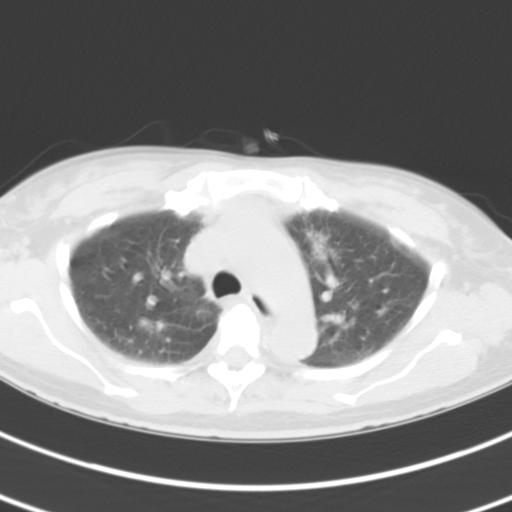
[im 41/53  lung]
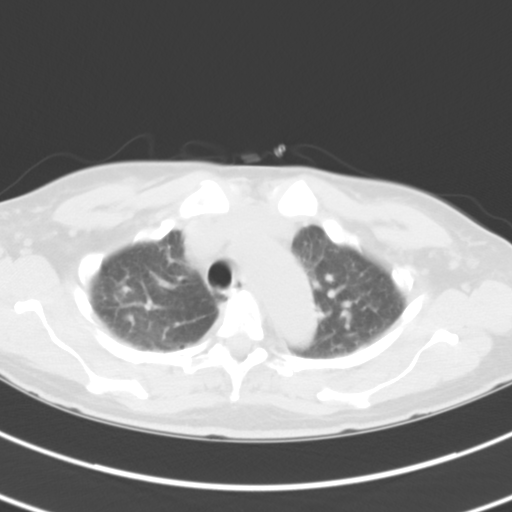
[im 45/53  lung]
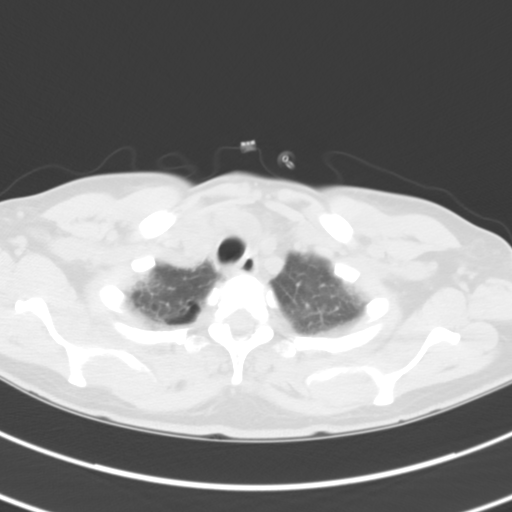
[im 49/53  lung]
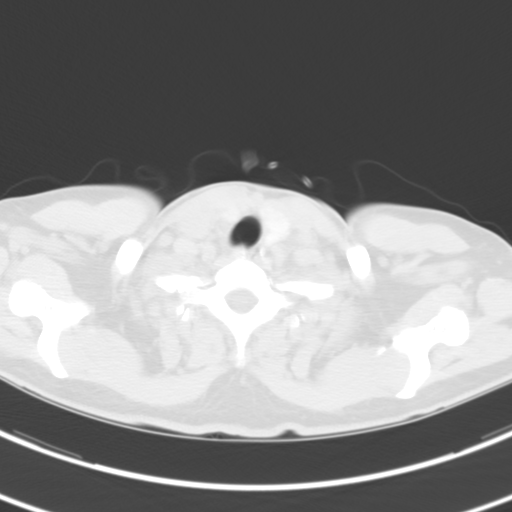

[Series 5: coronal · coronal · 0.59mm/px · 3 of 72 slices shown]
[im 15/72  lung]
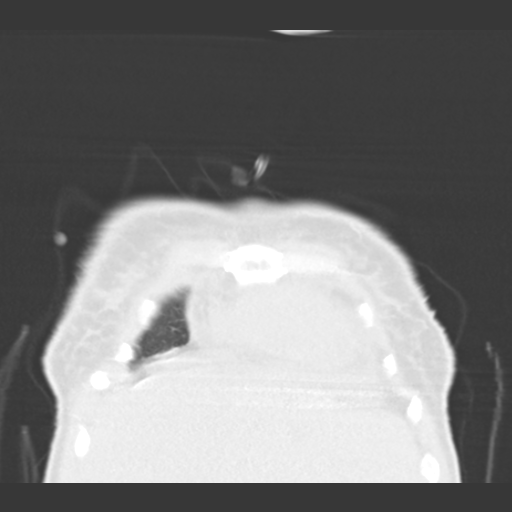
[im 29/72  lung]
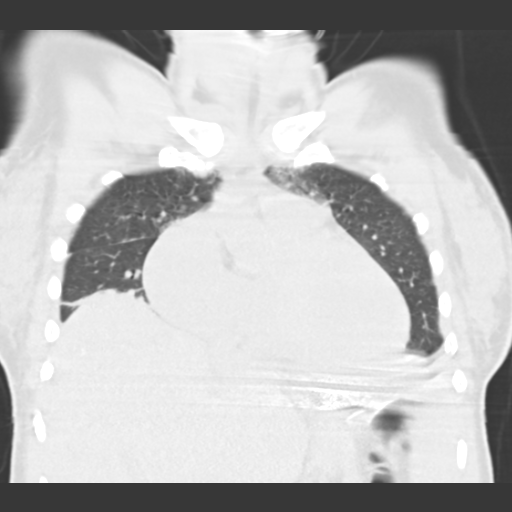
[im 43/72  lung]
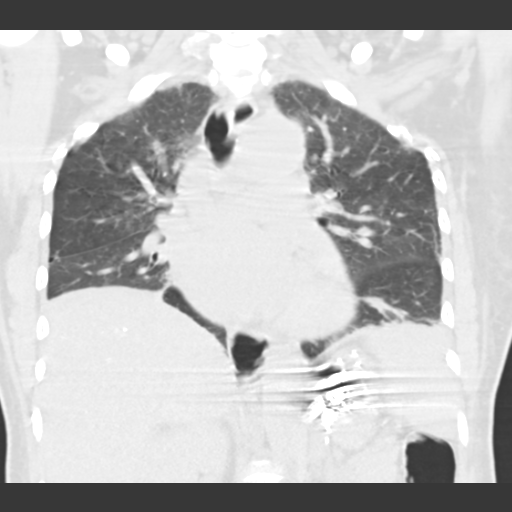

[15 of 36 positions shown; findings below may reference images not displayed]

FINDINGS: There is a focal, irregular non patchy opacity in the anterior,
medial aspect of the left upper lobe. This measures 3.1 x 1.5 cm on
axial image number 13 and 1.9 cm in cephalocaudal dimension on
sagittal image number 63. There is a slightly more vague, similar
irregular density in the right upper lobe, measuring 1.2 x 0.8 cm on
axial image number 10 and 1.5 cm in cephalocaudal dimension on
coronal image number 48.

Mild biapical bullous changes and pleural and parenchymal scarring
are demonstrated. Also noted is bilateral lower lobe atelectasis.

There are 3 small nodular densities adjacent to the minor fissure in
the inferior aspect of the right upper lobe. The largest measures 3
mm in maximum diameter and is anteriorly located on image number 22.
A smaller nodular density more laterally and posteriorly on that
image, has an appearance suggesting a small focally prominent vessel
on the coronal images. The third is more posteriorly and laterally
located, measuring 2 mm in maximum diameter on axial image number
23.

No enlarged lymph nodes. Diffusely enlarged heart. Previously noted
changes of cirrhosis of the liver in the upper abdomen. The
previously demonstrated hepatocellular infiltration of the liver is
not as well visualized without intravenous contrast today. An oval
area low-density anteriorly on the right is unchanged, measuring
cm in maximum diameter on image number 51.

The previously demonstrated upper pole right renal cyst and 3 mm
calculus are unchanged. Small punctate densities in the liver
compatible with sclerosing agent are again demonstrated. Metallic
coils with associated streak artifact are again demonstrated in the
left upper abdomen. Thoracic, upper lumbar and lower cervical spine
degenerative changes.
IMPRESSION: 1. Area of irregular, patchy density in each upper lobe, as
described above. Differential considerations include in situ
pulmonary adenocarcinoma and infection.
2. Bilateral lower lobe atelectasis.
3. Cardiomegaly.
4. Small nodules in the inferior aspect of the right upper lobe,
along the minor fissure, as described above. These may represent
subpleural lymph nodes.
5. Previously noted changes of cirrhosis of the liver with
hepatocellular carcinoma and sclerosing agent.
6. Stable upper pole right renal cyst and nonobstructing calculus.
